# Patient Record
Sex: Male | Born: 1995
Health system: Southern US, Community
[De-identification: ages and names within clinical notes are randomized; demographics above are authoritative.]

## PROBLEM LIST (undated history)

## (undated) DIAGNOSIS — B20 Human immunodeficiency virus [HIV] disease: Secondary | ICD-10-CM

## (undated) DIAGNOSIS — Z21 Asymptomatic human immunodeficiency virus [HIV] infection status: Secondary | ICD-10-CM

## (undated) DIAGNOSIS — J45909 Unspecified asthma, uncomplicated: Secondary | ICD-10-CM

## (undated) DIAGNOSIS — R51 Headache: Secondary | ICD-10-CM

## (undated) DIAGNOSIS — I951 Orthostatic hypotension: Secondary | ICD-10-CM

## (undated) HISTORY — DX: Asymptomatic human immunodeficiency virus (hiv) infection status: Z21

## (undated) HISTORY — PX: HERNIA REPAIR: SHX51

## (undated) HISTORY — DX: Human immunodeficiency virus (HIV) disease: B20

## (undated) HISTORY — DX: Headache: R51

## (undated) HISTORY — PX: NO PAST SURGERIES: SHX2092

---
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
## 2016-02-04 ENCOUNTER — Encounter (HOSPITAL_COMMUNITY): Payer: Self-pay | Admitting: Emergency Medicine

## 2016-02-04 ENCOUNTER — Emergency Department (HOSPITAL_COMMUNITY)
Admission: EM | Admit: 2016-02-04 | Discharge: 2016-02-04 | Disposition: A | Discharge status: Home / Self Care | Payer: Medicaid Other | Attending: Emergency Medicine | Admitting: Emergency Medicine

## 2016-02-04 DIAGNOSIS — Z791 Long term (current) use of non-steroidal anti-inflammatories (NSAID): Secondary | ICD-10-CM | POA: Insufficient documentation

## 2016-02-04 DIAGNOSIS — J45909 Unspecified asthma, uncomplicated: Secondary | ICD-10-CM | POA: Insufficient documentation

## 2016-02-04 DIAGNOSIS — J019 Acute sinusitis, unspecified: Secondary | ICD-10-CM | POA: Insufficient documentation

## 2016-02-04 DIAGNOSIS — Z79899 Other long term (current) drug therapy: Secondary | ICD-10-CM | POA: Insufficient documentation

## 2016-02-04 MED ORDER — AMOXICILLIN-POT CLAVULANATE 875-125 MG PO TABS: 1.0000 | ORAL_TABLET | Freq: Two times a day (BID) | ORAL | Status: DC

## 2016-02-04 NOTE — ED Notes (Signed)
Pt c/o sinus pain x2 weeks 

## 2016-02-04 NOTE — Discharge Instructions (Signed)

## 2016-02-04 NOTE — ED Provider Notes (Signed)
CSN: [id number]     Arrival date & time 02/04/16  1448 History  By signing my name below, I, Iona Beard, attest that this documentation has been prepared under the direction and in the presence of Nakoa Ganus, PA-C.   Electronically Signed: Iona Beard, ED Scribe 02/04/2016 at 3:31 PM.  Chief Complaint  Patient presents with  . Facial Pain    The history is provided by the patient. No language interpreter was used.   HPI Comments: Gerald Lee is a 20 y.o. male with PMHx of asthma and headache who presents to the Emergency Department complaining of gradual onset, sinus pressure and facial pain, ongoing for two weeks. The sinus pressure is currently above his right eye but he reports feeling the sensation under and above both eyes intermittently over the past two weeks. Pt reports associated productive cough with green phlegm, rhinorrhea with green discharge, postnasal drip, and nasal congestion. Pt states he can feel the nasal drainage into the back of his throat which he then coughs up. No other associated symptoms reported. Pt has tried sudafed and ibuprofen with minimal relief to symptoms. No other worsening or alleviating factors noted. Denies known sick contacts. Pt denies sore throat, headache, syncope, ear pain, neck pain, chest pain, SOB, abdominal pain, nausea, vomiting, fevers, chills, or any other pertinent symptoms.   Past Medical History  Diagnosis Date  . Asthma   . XBJYNWGN(562.1)    Past Surgical History  Procedure Laterality Date  . Hernia repair      Done between the ages of 7 or 8 years   . Circumcision  1998   Family History  Problem Relation Age of Onset  . Stroke Mother   . Hypertension Mother    Social History  Substance Use Topics  . Smoking status: Never Smoker   . Smokeless tobacco: Never Used  . Alcohol Use: Yes     Comment: occ    Review of Systems  Constitutional: Negative for fever and chills.  HENT: Positive for congestion,  postnasal drip, rhinorrhea and sinus pressure. Negative for sore throat.   Respiratory: Positive for cough.   All other systems reviewed and are negative.    Allergies  Review of patient's allergies indicates no known allergies.  Home Medications   Prior to Admission medications   Medication Sig Start Date End Date Taking? Authorizing Provider  ibuprofen (ADVIL,MOTRIN) 800 MG tablet Take 800 mg by mouth every 8 (eight) hours as needed.   Yes Historical Provider, MD  amoxicillin-clavulanate (AUGMENTIN) 875-125 MG tablet Take 1 tablet by mouth every 12 (twelve) hours. 02/04/16   Gershon Shorten, PA-C  ondansetron (ZOFRAN ODT) 8 MG disintegrating tablet Take 1 tablet (8 mg total) by mouth every 8 (eight) hours as needed for nausea or vomiting. Patient not taking: Reported on 02/04/2016 04/30/15   [name], MD   BP 133/62 mmHg  Pulse 74  Temp(Src) 98.2 F (36.8 C) (Oral)  Resp 16  Ht  (1.93 m)  Wt 257 lb 8 oz (116.801 kg)  BMI 31.36 kg/m2  SpO2 97% Physical Exam  Constitutional: He appears well-developed and well-nourished. No distress.  HENT:  Head: Normocephalic and atraumatic.  Right Ear: Tympanic membrane and ear canal normal.  Left Ear: Tympanic membrane and ear canal normal.  Nose: Mucosal edema present. Right sinus exhibits frontal sinus tenderness.  Mouth/Throat: Uvula is midline.  Right frontal sinus TTP. No overlying erythema. Nasal congestion noted with postnasal drip. No oropharyngeal erythema or exudate.  Eyes: Conjunctivae are normal. Right eye exhibits no discharge. Left eye exhibits no discharge. No scleral icterus.  Neck: Normal range of motion. Neck supple.  Cardiovascular: Normal rate, regular rhythm and normal heart sounds.   Pulmonary/Chest: Effort normal and breath sounds normal. No respiratory distress.  Abdominal: He exhibits no distension. There is no tenderness.  Musculoskeletal: Normal range of motion.  Lymphadenopathy:    He has no cervical  adenopathy.  Neurological: He is alert. Coordination normal.  Skin: Skin is warm and dry.  Psychiatric: He has a normal mood and affect. His behavior is normal.  Nursing note and vitals reviewed.   ED Course  Procedures (including critical care time) DIAGNOSTIC STUDIES: Oxygen Saturation is 97% on RA, normal by my interpretation.    COORDINATION OF CARE: 3:12 PM Discussed treatment plan with pt at bedside and pt agreed to plan.  Labs Review Labs Reviewed - No data to display  Imaging Review No results found.   EKG Interpretation None      MDM   Final diagnoses:  Acute sinusitis, recurrence not specified, unspecified location   Patient presenting with symptoms of sinusitis x 2 weeks. Symptoms include sinus pressure, facial pain, congestion, postnasal drip and cough. Afebrile and nontoxic appearing. TTP of right frontal sinus. Nasal congestion noted with postnasal drip. Lungs CTAB. Concern for acute bacterial rhinosinusitis. Patient discharged with Augmentin. Instructions given for warm saline nasal wash and recommendations for follow-up with primary care physician as needed. Return precautions given in discharge paperwork and discussed with pt at bedside. Pt stable for discharge  I personally performed the services described in this documentation, which was scribed in my presence. The recorded information has been reviewed and is accurate.   Rolm GalaStevi Alexius Hangartner, PA-C 02/04/16 1532  Donnetta HutchingBrian Cook, MD 02/05/16 1316

## 2016-02-22 ENCOUNTER — Encounter (HOSPITAL_COMMUNITY): Payer: Self-pay

## 2016-02-22 ENCOUNTER — Emergency Department (HOSPITAL_COMMUNITY)
Admission: EM | Admit: 2016-02-22 | Discharge: 2016-02-22 | Disposition: A | Discharge status: Home / Self Care | Payer: Medicaid Other | Attending: Emergency Medicine | Admitting: Emergency Medicine

## 2016-02-22 DIAGNOSIS — Y9389 Activity, other specified: Secondary | ICD-10-CM | POA: Insufficient documentation

## 2016-02-22 DIAGNOSIS — Y990 Civilian activity done for income or pay: Secondary | ICD-10-CM | POA: Insufficient documentation

## 2016-02-22 DIAGNOSIS — S29012A Strain of muscle and tendon of back wall of thorax, initial encounter: Secondary | ICD-10-CM

## 2016-02-22 DIAGNOSIS — J45909 Unspecified asthma, uncomplicated: Secondary | ICD-10-CM | POA: Insufficient documentation

## 2016-02-22 DIAGNOSIS — Z79899 Other long term (current) drug therapy: Secondary | ICD-10-CM | POA: Insufficient documentation

## 2016-02-22 DIAGNOSIS — X500XXA Overexertion from strenuous movement or load, initial encounter: Secondary | ICD-10-CM | POA: Insufficient documentation

## 2016-02-22 DIAGNOSIS — Y92513 Shop (commercial) as the place of occurrence of the external cause: Secondary | ICD-10-CM | POA: Insufficient documentation

## 2016-02-22 MED ORDER — NAPROXEN 500 MG PO TABS: 500.0000 mg | ORAL_TABLET | Freq: Two times a day (BID) | ORAL | Status: DC

## 2016-02-22 MED ORDER — METHOCARBAMOL 500 MG PO TABS: 500.0000 mg | ORAL_TABLET | Freq: Every evening | ORAL | Status: DC | PRN

## 2016-02-22 NOTE — ED Notes (Signed)
Declined W/C at D/C and was escorted to lobby by RN. 

## 2016-02-22 NOTE — Discharge Instructions (Signed)
1. Medications: Take naproxen twice daily as needed for pain. Robaxin is your muscle relaxer - take this at night as needed for pain / muscle spasms. 2. Treatment: rest, drink plenty of fluids, ice affected area as needed 3. Follow Up: Please follow up with your primary doctor in 7 days if no improvement in symptoms for discussion of your diagnoses and further evaluation after today's visit. Please return to the ER for new or worsening symptoms, any additional concerns.   COLD THERAPY DIRECTIONS:  Ice or gel packs can be used to reduce both pain and swelling. Ice is the most helpful within the first 24 to 48 hours after an injury or flareup from overusing a muscle or joint.  Ice is effective, has very few side effects, and is safe for most people to use.   If you expose your skin to cold temperatures for too long or without the proper protection, you can damage your skin or nerves. Watch for signs of skin damage due to cold.   HOME CARE INSTRUCTIONS  Follow these tips to use ice and cold packs safely.  Place a dry or damp towel between the ice and skin. A damp towel will cool the skin more quickly, so you may need to shorten the time that the ice is used.  For a more rapid response, add gentle compression to the ice.  Ice for no more than 10 to 20 minutes at a time. The bonier the area you are icing, the less time it will take to get the benefits of ice.  Check your skin after 5 minutes to make sure there are no signs of a poor response to cold or skin damage.  Rest 20 minutes or more in between uses.  Once your skin is numb, you can end your treatment. You can test numbness by very lightly touching your skin. The touch should be so light that you do not see the skin dimple from the pressure of your fingertip. When using ice, most people will feel these normal sensations in this order: cold, burning, aching, and numbness.

## 2016-02-22 NOTE — ED Notes (Signed)
Pt reports having upper left scupla/back pain for two weeks that has worsened with working. Pt reporst working at [employer]. No specific known injury or trauma remembered.

## 2016-02-22 NOTE — ED Provider Notes (Signed)
CSN: [id number]     Arrival date & time 02/22/16  1621 History  By signing my name below, I, Rosario AdieWilliam Andrew Hiatt, attest that this documentation has been prepared under the direction and in the presence of [facility] HospitalJaime Flois Mctague, PA-C.   Electronically Signed: Rosario AdieWilliam Andrew Hiatt, ED Scribe. 02/22/2016. 6:03 PM.    Chief Complaint  Patient presents with  . Back Pain   The history is provided by the patient. No language interpreter was used.   HPI Comments: Gerald Lee is a 20 y.o. male who is ambidextrous who presents to the Emergency Department complaining of gradual onset, gradually worsening, constant left-sided upper back pain onset 3 weeks ago. His pain is worsened when he is laying down sleeping at night and while he is doing heavy lifting at work. No OTC medications or home remedies tried PTA. Pt reports that he works at [employer] and lifts heavy objects frequently. He originally attributed his pain to soreness, but notes it is still worsening. He denies any known trauma or injury to the area. Pt denies neck pain, headache, fever, or any other associated symptoms.   Past Medical History  Diagnosis Date  . Asthma   . [name](478.2Headache(784.0)    Past Surgical History  Procedure Laterality Date  . Hernia repair      Done between the ages of 7 or 8 years   . Circumcision  1998   Family History  Problem Relation Age of Onset  . Stroke Mother   . Hypertension Mother    Social History  Substance Use Topics  . Smoking status: Never Smoker   . Smokeless tobacco: Never Used  . Alcohol Use: Yes     Comment: occ    Review of Systems  Constitutional: Negative for fever.  Musculoskeletal: Positive for myalgias and back pain. Negative for neck pain.   Allergies  Review of patient's allergies indicates no known allergies.  Home Medications   Prior to Admission medications   Medication Sig Start Date End Date Taking? Authorizing Provider  amoxicillin-clavulanate (AUGMENTIN) 875-125 MG tablet Take 1  tablet by mouth every 12 (twelve) hours. 02/04/16   [name], PA-C  ibuprofen (ADVIL,MOTRIN) 800 MG tablet Take 800 mg by mouth every 8 (eight) hours as needed.    Historical Provider, MD  methocarbamol (ROBAXIN) 500 MG tablet Take 1 tablet (500 mg total) by mouth at bedtime as needed for muscle spasms. 02/22/16   Chase PicketJaime Pilcher Makoto Sellitto, PA-C  naproxen (NAPROSYN) 500 MG tablet Take 1 tablet (500 mg total) by mouth 2 (two) times daily. 02/22/16   Chase PicketJaime Pilcher Hildagarde Holleran, PA-C  ondansetron (ZOFRAN ODT) 8 MG disintegrating tablet Take 1 tablet (8 mg total) by mouth every 8 (eight) hours as needed for nausea or vomiting. Patient not taking: Reported on 02/04/2016 04/30/15   Azalia BilisKevin Campos, MD   BP 126/72 mmHg  Pulse 83  Temp(Src) 98.6 F (37 C) (Oral)  Resp 20  SpO2 100%   Physical Exam  Constitutional: He is oriented to person, place, and time. He appears well-developed and well-nourished.  HENT:  Head: Normocephalic and atraumatic.  Cardiovascular: Normal rate, regular rhythm and normal heart sounds.   No murmur heard. Pulmonary/Chest: Effort normal and breath sounds normal. No respiratory distress.  Musculoskeletal: Normal range of motion. He exhibits tenderness.       Arms: TTP as depicted in image. No erythema, crepitance, deformity, or step-off. 5/5 muscle strength of bilateral upper extremeiteis. No midline tenderness.  Neck: No midline or paraspinal tenderness.  Full ROM without pain.  Neurological: He is alert and oriented to person, place, and time.  Bilateral upper extremities neurovascularly intact.   Skin: Skin is warm and dry.  Psychiatric: He has a normal mood and affect. His behavior is normal.  Nursing note and vitals reviewed.  ED Course  Procedures (including critical care time)  DIAGNOSTIC STUDIES: Oxygen Saturation is 100% on RA, normal by my interpretation.   COORDINATION OF CARE: 6:01 PM-Discussed next steps with pt including anti-inflammatories and muscle relaxants. Pt  verbalized understanding and is agreeable with the plan.   Labs Review Labs Reviewed - No data to display  Imaging Review No results found.  I have personally reviewed and evaluated these images and lab results as part of my medical decision-making.   EKG Interpretation None      MDM   Final diagnoses:  Muscle strain of left upper back, initial encounter   Gerald Lee presents to ED for left upper back pain x 3 weeks which began after doing a lot of heavy lifting at work. He has continued to work ([employer]) throughout the last 3 weeks. He missed work today because his back was hurting him, and states he came in today because his boss told him he needed a note for missing work. On exam, patient is very well appearing. TTP of musculature, likely muscle strain. Symptomatic home care instructions were discussed with patient. Will treat with NSAID's and Robaxin QHS. Follow up with PCP in 1 week if symptoms do not improve. Return precautions given and all questions answered.   I personally performed the services described in this documentation, which was scribed in my presence. The recorded information has been reviewed and is accurate.   Chase PicketJaime Pilcher Hatice Bubel, PA-C 02/22/16 1844  Melene Planan Floyd, DO 02/22/16 2053

## 2016-12-18 ENCOUNTER — Emergency Department (HOSPITAL_COMMUNITY)
Admission: EM | Admit: 2016-12-18 | Discharge: 2016-12-19 | Disposition: A | Discharge status: Home / Self Care | Payer: Medicaid Other | Attending: Emergency Medicine | Admitting: Emergency Medicine

## 2016-12-18 DIAGNOSIS — Z79899 Other long term (current) drug therapy: Secondary | ICD-10-CM | POA: Insufficient documentation

## 2016-12-18 DIAGNOSIS — J45909 Unspecified asthma, uncomplicated: Secondary | ICD-10-CM | POA: Insufficient documentation

## 2016-12-18 DIAGNOSIS — L0292 Furuncle, unspecified: Secondary | ICD-10-CM | POA: Diagnosis present

## 2016-12-19 ENCOUNTER — Encounter (HOSPITAL_COMMUNITY): Payer: Self-pay

## 2016-12-19 NOTE — ED Notes (Signed)
Pt stable, ambulatory, states understanding of discharge instructions 

## 2016-12-19 NOTE — ED Provider Notes (Addendum)
[facility]-EMERGENCY DEPT Provider Note   CSN: [id number] Arrival date & time: 12/18/16  2330  By signing my name below, I, Gerald Lee, attest that this documentation has been prepared under the direction and in the presence of [name], MD . Electronically Signed: Majel Lee, Scribe. 12/19/2016. 1:00 AM.  History   Chief Complaint Chief Complaint  Patient presents with  . Genital Warts   The history is provided by the patient. No language interpreter was used.   HPI Comments: Gerald Lee is a 21 y.o. male with PMHx of asthma, who presents to the Emergency Department for an evaluation of a "bump" on his proximal penis that he noticed 2 days ago while in the shower. Pt reports hx of similar symptoms ~1 year ago in which he was tested for Herpes that returned negative. He states he visited the ED today "just to be safe." He denies any redness, drainage from the area, dysuria, or hematuria.   Past Medical History:  Diagnosis Date  . Asthma   . [name](478.2)    Patient Active Problem List   Diagnosis Date Noted  . Migraine without aura, without mention of intractable migraine without mention of status migrainosus 03/14/2014  . Episodic tension type headache 03/14/2014  . Obesity, unspecified 03/14/2014  . Acanthosis nigricans, acquired 03/14/2014  . Insomnia, unspecified 03/14/2014   Past Surgical History:  Procedure Laterality Date  . CIRCUMCISION  1998  . HERNIA REPAIR     Done between the ages of 39 or 8 years     Home Medications    Prior to Admission medications   Medication Sig Start Date End Date Taking? Authorizing Provider  amoxicillin-clavulanate (AUGMENTIN) 875-125 MG tablet Take 1 tablet by mouth every 12 (twelve) hours. 02/04/16   [name], PA-C  ibuprofen (ADVIL,MOTRIN) 800 MG tablet Take 800 mg by mouth every 8 (eight) hours as needed.    Historical Provider, MD  methocarbamol (ROBAXIN) 500 MG tablet Take 1 tablet (500 mg total) by mouth at bedtime as needed  for muscle spasms. 02/22/16   [name], PA-C  naproxen (NAPROSYN) 500 MG tablet Take 1 tablet (500 mg total) by mouth 2 (two) times daily. 02/22/16   [name], PA-C  ondansetron (ZOFRAN ODT) 8 MG disintegrating tablet Take 1 tablet (8 mg total) by mouth every 8 (eight) hours as needed for nausea or vomiting. Patient not taking: Reported on 02/04/2016 04/30/15   [name], MD   Family History Family History  Problem Relation Age of Onset  . Stroke Mother   . Hypertension Mother    Social History Social History  Substance Use Topics  . Smoking status: Never Smoker  . Smokeless tobacco: Never Used  . Alcohol use Yes     Comment: occ   Allergies   Patient has no known allergies.  Review of Systems Review of Systems  Genitourinary: Negative for dysuria and hematuria.       +small bump to proximal penis  All other systems reviewed and are negative.  Physical Exam Updated Vital Signs BP 119/64 (BP Location: Left Arm)   Pulse 74   Temp 98.3 F (36.8 C) (Oral)   Resp 16   SpO2 100%   Physical Exam  Constitutional: He is oriented to person, place, and time. Vital signs are normal. He appears well-developed and well-nourished.  Non-toxic appearance. He does not appear ill. No distress.  HENT:  Head: Normocephalic and atraumatic.  Nose: Nose normal.  Mouth/Throat: Oropharynx is clear  and moist. No oropharyngeal exudate.  Eyes: Conjunctivae and EOM are normal. Pupils are equal, round, and reactive to light. No scleral icterus.  Neck: Normal range of motion. Neck supple. No tracheal deviation, no edema, no erythema and normal range of motion present. No thyroid mass and no thyromegaly present.  Cardiovascular: Normal rate, regular rhythm, S1 normal, S2 normal, normal heart sounds, intact distal pulses and normal pulses.  Exam reveals no gallop and no friction rub.   No murmur heard. Pulmonary/Chest: Effort normal and breath sounds normal. No respiratory distress. He  has no wheezes. He has no rhonchi. He has no rales.  Abdominal: Soft. Normal appearance and bowel sounds are normal. He exhibits no distension, no ascites and no mass. There is no hepatosplenomegaly. There is no tenderness. There is no rebound, no guarding and no CVA tenderness.  Genitourinary:  Genitourinary Comments: Tiny boil on the dorsum of the proximal penis, no erythema, no purulence, no drainage, no TTP.  Musculoskeletal: Normal range of motion. He exhibits no edema or tenderness.  Lymphadenopathy:    He has no cervical adenopathy.  Neurological: He is alert and oriented to person, place, and time. He has normal strength. No cranial nerve deficit or sensory deficit.  Skin: Skin is warm, dry and intact. No petechiae and no rash noted. He is not diaphoretic. No erythema. No pallor.  Nursing note and vitals reviewed.  ED Treatments / Results  DIAGNOSTIC STUDIES:  Oxygen Saturation is 100% on RA, normal by my interpretation.    COORDINATION OF CARE:  12:57 AM Discussed treatment plan with pt at bedside and pt agreed to plan.  Labs (all labs ordered are listed, but only abnormal results are displayed) Labs Reviewed - No data to display  EKG  EKG Interpretation None       Radiology No results found.  Procedures Procedures (including critical care time)  Medications Ordered in ED Medications - No data to display  Initial Impression / Assessment and Plan / ED Course  I have reviewed the triage vital signs and the nursing notes.  Pertinent labs & imaging results that were available during my care of the patient were reviewed by me and considered in my medical decision making (see chart for details).     Patient presents to the ED for a bump on his penis. It appears to be a well healing boil from ingrown hair.  He was reassured that this is not herpes.  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  He was educated on current diagnosis.  He was  advised to fu with PCP within 3 days.  He demonstrates good understanding of the plan.  The boil is very small and there is no purulence to lance.  He appears well and in NAD. VS are normal. Patient is safe for DC.   I personally performed the services described in this documentation, which was scribed in my presence. The recorded information has been reviewed and is accurate.     Final Clinical Impressions(s) / ED Diagnoses   Final diagnoses:  None    New Prescriptions New Prescriptions   No medications on file         [name], MD 12/19/16 0105

## 2016-12-19 NOTE — ED Triage Notes (Signed)
Pt states he wants to be evaluated for bump on genial that he noticed on yesterday; pt states slight pain at 1/10 on arrival. Pt a&ox 4 on arrival.

## 2017-02-28 ENCOUNTER — Emergency Department (HOSPITAL_COMMUNITY)
Admission: EM | Admit: 2017-02-28 | Discharge: 2017-02-28 | Disposition: A | Discharge status: Home / Self Care | Payer: Medicaid Other | Attending: Emergency Medicine | Admitting: Emergency Medicine

## 2017-02-28 ENCOUNTER — Encounter (HOSPITAL_COMMUNITY): Payer: Self-pay | Admitting: Emergency Medicine

## 2017-02-28 DIAGNOSIS — R51 Headache: Secondary | ICD-10-CM | POA: Diagnosis present

## 2017-02-28 DIAGNOSIS — B349 Viral infection, unspecified: Secondary | ICD-10-CM | POA: Insufficient documentation

## 2017-02-28 DIAGNOSIS — J45909 Unspecified asthma, uncomplicated: Secondary | ICD-10-CM | POA: Diagnosis not present

## 2017-02-28 DIAGNOSIS — R519 Headache, unspecified: Secondary | ICD-10-CM

## 2017-02-28 LAB — RAPID STREP SCREEN (MED CTR MEBANE ONLY): Streptococcus, Group A Screen (Direct): NEGATIVE

## 2017-02-28 MED ORDER — PROCHLORPERAZINE EDISYLATE 5 MG/ML IJ SOLN
10.0000 mg | Freq: Once | INTRAMUSCULAR | Status: AC
  Administered 2017-02-28: 10 mg via INTRAVENOUS
  Filled 2017-02-28: qty 2

## 2017-02-28 MED ORDER — SODIUM CHLORIDE 0.9 % IV BOLUS (SEPSIS)
1000.0000 mL | Freq: Once | INTRAVENOUS | Status: AC
  Administered 2017-02-28: 1000 mL via INTRAVENOUS

## 2017-02-28 MED ORDER — DIPHENHYDRAMINE HCL 50 MG/ML IJ SOLN
25.0000 mg | Freq: Once | INTRAMUSCULAR | Status: AC
  Administered 2017-02-28: 25 mg via INTRAVENOUS
  Filled 2017-02-28: qty 1

## 2017-02-28 MED ORDER — KETOROLAC TROMETHAMINE 30 MG/ML IJ SOLN
30.0000 mg | Freq: Once | INTRAMUSCULAR | Status: AC
  Administered 2017-02-28: 30 mg via INTRAVENOUS
  Filled 2017-02-28: qty 1

## 2017-02-28 NOTE — Discharge Instructions (Signed)
Return to the ED with change in vision or speech, weakness, fainting, difficulty breathing, vomiting and not able to keep down liquids, decreased level of alertness/lethargy, or any other alarming symptoms

## 2017-02-28 NOTE — ED Provider Notes (Signed)
[facility]-EMERGENCY DEPT Provider Note   CSN: [id number] Arrival date & time: 02/28/17  [id number]     History   Chief Complaint Chief Complaint  Patient presents with  . Eye Pain  . Generalized Body Aches    HPI Gerald Lee is a 21 y.o. male.  HPI  Pt with hx of headaches presenting with c/o headache and body aches.  Headache is behind both eyes and has been ongoing for the past 2 days.  He also c/o diffuse generalized body aches.  He has tried ibuprofen and has been drinking water.  No specific sick contacts.  He denies sore throat but states his throat has felt dry.  He also states he has been working at a new job at a computer and knows that he needs glasses but hasn't been able to afford eye exam/glasses.  No diplopia.  There are no other associated systemic symptoms, there are no other alleviating or modifying factors.   Past Medical History:  Diagnosis Date  . Asthma   . FAOZHYQM(578.4Headache(784.0)     Patient Active Problem List   Diagnosis Date Noted  . Migraine without aura, without mention of intractable migraine without mention of status migrainosus 03/14/2014  . Episodic tension type headache 03/14/2014  . Obesity, unspecified 03/14/2014  . Acanthosis nigricans, acquired 03/14/2014  . Insomnia, unspecified 03/14/2014    Past Surgical History:  Procedure Laterality Date  . CIRCUMCISION  1998  . HERNIA REPAIR     Done between the ages of 89 or 8 years        Home Medications    Prior to Admission medications   Medication Sig Start Date End Date Taking? Authorizing Provider  amoxicillin-clavulanate (AUGMENTIN) 875-125 MG tablet Take 1 tablet by mouth every 12 (twelve) hours. Patient not taking: Reported on 02/28/2017 02/04/16   Barrett, Rolm GalaStevi, PA-C  ibuprofen (ADVIL,MOTRIN) 800 MG tablet Take 800 mg by mouth every 8 (eight) hours as needed.    [provider]  methocarbamol (ROBAXIN) 500 MG tablet Take 1 tablet (500 mg total) by mouth at bedtime as needed for muscle  spasms. Patient not taking: Reported on 02/28/2017 02/22/16   Ward, Chase PicketJaime Pilcher, PA-C  naproxen (NAPROSYN) 500 MG tablet Take 1 tablet (500 mg total) by mouth 2 (two) times daily. Patient not taking: Reported on 02/28/2017 02/22/16   Ward, Chase PicketJaime Pilcher, PA-C  ondansetron (ZOFRAN ODT) 8 MG disintegrating tablet Take 1 tablet (8 mg total) by mouth every 8 (eight) hours as needed for nausea or vomiting. Patient not taking: Reported on 02/04/2016 04/30/15   Azalia Bilisampos, Kevin, MD    Family History Family History  Problem Relation Age of Onset  . Stroke Mother   . Hypertension Mother     Social History Social History  Substance Use Topics  . Smoking status: Never Smoker  . Smokeless tobacco: Never Used  . Alcohol use Yes     Comment: occ     Allergies   Patient has no known allergies.   Review of Systems Review of Systems  ROS reviewed and all otherwise negative except for mentioned in HPI   Physical Exam Updated Vital Signs BP (!) 115/52   Pulse 71   Temp 100 F (37.8 C) (Oral)   Resp 16   Ht 6\' 4"  (1.93 m)   Wt 113.4 kg (250 lb)   SpO2 97%   BMI 30.43 kg/m  Vitals reviewed Physical Exam Physical Examination: General appearance - alert, well appearing, and in no distress Mental  status - alert, oriented to person, place, and time Eyes - pupils equal and reactive, extraocular eye movements intact Mouth - mucous membranes moist, pharynx normal without lesions, mild erythema of OP, no exudate, palate symmetric, uvula midline Neck - supple, no significant adenopathy Chest - clear to auscultation, no wheezes, rales or rhonchi, symmetric air entry Heart - normal rate, regular rhythm, normal S1, S2, no murmurs, rubs, clicks or gallops Abdomen - soft, nontender, nondistended, no masses or organomegaly Neurological - alert, oriented x 3, no cranial nerve defect, strength and sensation intact in extremities x 4 Extremities - peripheral pulses normal, no pedal edema, no clubbing or  cyanosis Skin - normal coloration and turgor, no rashes  ED Treatments / Results  Labs (all labs ordered are listed, but only abnormal results are displayed) Labs Reviewed  RAPID STREP SCREEN (NOT AT [facility])  CULTURE, GROUP A STREP [facility])    EKG  EKG Interpretation None       Radiology No results found.  Procedures Procedures (including critical care time)  Medications Ordered in ED Medications  ketorolac (TORADOL) 30 MG/ML injection 30 mg (30 mg Intravenous Given 02/28/17 0827)  diphenhydrAMINE (BENADRYL) injection 25 mg (25 mg Intravenous Given 02/28/17 0827)  prochlorperazine (COMPAZINE) injection 10 mg (10 mg Intravenous Given 02/28/17 0827)  sodium chloride 0.9 % bolus 1,000 mL (0 mLs Intravenous Stopped 02/28/17 1008)     Initial Impression / Assessment and Plan / ED Course  I have reviewed the triage vital signs and the nursing notes.  Pertinent labs & imaging results that were available during my care of the patient were reviewed by me and considered in my medical decision making (see chart for details).     Pt presenting with c/o headache and generalized body aches.  Pt treated with migraine cocktail, rapid strep negative.  No nuchal rigidity to suggest meningitis.   Patient is overall nontoxic and well hydrated in appearance.  suspcect viral illness.  Pt discharged with strict return precautions.  Mom agreeable with plan   Final Clinical Impressions(s) / ED Diagnoses   Final diagnoses:  Bad headache  Viral syndrome    New Prescriptions Discharge Medication List as of 02/28/2017  9:56 AM       Jerelyn Scott, MD 03/04/17 [phone]

## 2017-02-28 NOTE — ED Triage Notes (Signed)
Pt reports generalized body aches, nausea, dizziness, bilateral behind the eye pain with blurry vision, and a headache that started two days ago. Pt denies any injuries or out of the ordinary routine.

## 2017-03-02 LAB — CULTURE, GROUP A STREP (THRC)

## 2017-05-02 DIAGNOSIS — R21 Rash and other nonspecific skin eruption: Secondary | ICD-10-CM | POA: Diagnosis present

## 2017-05-02 DIAGNOSIS — B084 Enteroviral vesicular stomatitis with exanthem: Secondary | ICD-10-CM | POA: Diagnosis not present

## 2017-05-02 DIAGNOSIS — J45909 Unspecified asthma, uncomplicated: Secondary | ICD-10-CM | POA: Diagnosis not present

## 2017-05-03 ENCOUNTER — Emergency Department (HOSPITAL_COMMUNITY)
Admission: EM | Admit: 2017-05-03 | Discharge: 2017-05-03 | Disposition: A | Discharge status: Home / Self Care | Payer: Medicaid Other | Attending: Emergency Medicine | Admitting: Emergency Medicine

## 2017-05-03 ENCOUNTER — Encounter (HOSPITAL_COMMUNITY): Payer: Self-pay | Admitting: Emergency Medicine

## 2017-05-03 DIAGNOSIS — B084 Enteroviral vesicular stomatitis with exanthem: Secondary | ICD-10-CM

## 2017-05-03 NOTE — ED Triage Notes (Signed)
Patient states he was getting a rash a week ago. Patient states that it has spread to his torso. Patient states it is not itching.

## 2017-05-03 NOTE — Discharge Instructions (Signed)
Follow-up with your primary care doctor in the next 24-48 hours for further evaluation.   Return to the Emergency Department for any worsening rash, spreading of the rash, fevers, chest pain, difficulty breathing, or any other worsening or concerning symptoms.

## 2017-05-03 NOTE — ED Provider Notes (Signed)
[facility]-EMERGENCY DEPT Provider Note   CSN: [id number] Arrival date & time: 05/02/17  2237     History   Chief Complaint Chief Complaint  Patient presents with  . Rash    HPI Gerald Lee is a 21 y.o. male who presents with 1 week of generalized rash. Patient states that the rash is not painful or pruritic. He does report that 2 days prior to onset of rash, he used a new body soap. Patient denies any other new lotions, detergents, soaps. Patient also reports a coworker at work was recently diagnosed with hand-foot-and-mouth disease approximately one week ago. He states that he did have close contact to the coworker prior to diagnoses. Patient denies any fevers, chills, chest pain, difficulty breathing, lip or throat swelling, dysuria, hematuria, penile swelling, penile discharge, joint swelling. Denies any tick bites. He states he has not had any exposure to the woods. She denies any new medications.  The history is provided by the patient.    Past Medical History:  Diagnosis Date  . Asthma   . [name](478.2Headache(784.0)     Patient Active Problem List   Diagnosis Date Noted  . Migraine without aura, without mention of intractable migraine without mention of status migrainosus 03/14/2014  . Episodic tension type headache 03/14/2014  . Obesity, unspecified 03/14/2014  . Acanthosis nigricans, acquired 03/14/2014  . Insomnia, unspecified 03/14/2014    Past Surgical History:  Procedure Laterality Date  . CIRCUMCISION  1998  . HERNIA REPAIR     Done between the ages of 89 or 8 years        Home Medications    Prior to Admission medications   Medication Sig Start Date End Date Taking? Authorizing Provider  amoxicillin-clavulanate (AUGMENTIN) 875-125 MG tablet Take 1 tablet by mouth every 12 (twelve) hours. Patient not taking: Reported on 02/28/2017 02/04/16   Barrett, Rolm GalaStevi, PA-C  ibuprofen (ADVIL,MOTRIN) 800 MG tablet Take 800 mg by mouth every 8 (eight) hours as needed.    [provider]  methocarbamol (ROBAXIN) 500 MG tablet Take 1 tablet (500 mg total) by mouth at bedtime as needed for muscle spasms. Patient not taking: Reported on 02/28/2017 02/22/16   Ward, Chase PicketJaime Pilcher, PA-C  naproxen (NAPROSYN) 500 MG tablet Take 1 tablet (500 mg total) by mouth 2 (two) times daily. Patient not taking: Reported on 02/28/2017 02/22/16   Ward, Chase PicketJaime Pilcher, PA-C  ondansetron (ZOFRAN ODT) 8 MG disintegrating tablet Take 1 tablet (8 mg total) by mouth every 8 (eight) hours as needed for nausea or vomiting. Patient not taking: Reported on 02/04/2016 04/30/15   Azalia Bilisampos, Kevin, MD    Family History Family History  Problem Relation Age of Onset  . Stroke Mother   . Hypertension Mother     Social History Social History  Substance Use Topics  . Smoking status: Never Smoker  . Smokeless tobacco: Never Used  . Alcohol use Yes     Comment: occ     Allergies   Patient has no known allergies.   Review of Systems Review of Systems  Constitutional: Negative for chills and fever.  Respiratory: Negative for shortness of breath.   Cardiovascular: Negative for chest pain.  Gastrointestinal: Negative for abdominal pain, nausea and vomiting.  Genitourinary: Negative for discharge, dysuria, hematuria, penile swelling and scrotal swelling.  Musculoskeletal: Negative for arthralgias and joint swelling.  Skin: Positive for rash.  Neurological: Negative for dizziness, weakness and numbness.  Psychiatric/Behavioral: Negative for confusion.     Physical Exam  Updated Vital Signs BP 139/87 (BP Location: Left Arm)   Pulse 91   Temp 98.4 F (36.9 C) (Oral)   Resp 18   Ht 6\' 4"  (1.93 m)   Wt 113.4 kg (250 lb)   SpO2 100%   BMI 30.43 kg/m   Physical Exam  Constitutional: He appears well-developed and well-nourished.  Sitting comfortably on examination table  HENT:  Head: Normocephalic and atraumatic.  Mouth/Throat: Uvula is midline, oropharynx is clear and moist and mucous  membranes are normal. No oral lesions.  No oral angioedema. No oral lesions.  Eyes: Conjunctivae and EOM are normal. Right eye exhibits no discharge. Left eye exhibits no discharge. No scleral icterus.  Cardiovascular:  Pulses:      Radial pulses are 2+ on the right side, and 2+ on the left side.       Dorsalis pedis pulses are 2+ on the right side, and 2+ on the left side.  Pulmonary/Chest: Effort normal.  No evidence of respiratory distress. Able to speak in full sentences without difficulty.  Neurological: He is alert.  Skin: Skin is warm and dry. Rash noted.  Diffuse erythematous, macular papular rash to the anterior chest, back, BUE. Patient has one small area on his left sole and 2 small areas on the bilateral palms.   Psychiatric: He has a normal mood and affect. His speech is normal and behavior is normal.  Nursing note and vitals reviewed.          ED Treatments / Results  Labs (all labs ordered are listed, but only abnormal results are displayed) Labs Reviewed - No data to display  EKG  EKG Interpretation None       Radiology No results found.  Procedures Procedures (including critical care time)  Medications Ordered in ED Medications - No data to display   Initial Impression / Assessment and Plan / ED Course  I have reviewed the triage vital signs and the nursing notes.  Pertinent labs & imaging results that were available during my care of the patient were reviewed by me and considered in my medical decision making (see chart for details).     21 year old male who presents with 1 week of rash. No fever, chills, chest pain, difficulty breathing, difficulty swelling, or laryngeal edema. Does have reported exposure to new soap but stop using the soap and has not had improvement in rash. No recent tick bites or wind exposure. No joint swelling or fevers. Recent exposure to hand-foot-and-mouth disease. Consider contact dermatitis versus hand-foot-and-mouth  disease. History/physical exam or not concerning for [facility] spotted fever, syphilis, SJS, TENS.  Discussed patient with Dr. Bebe Shaggy. Physical exam is consistent with hand-foot-and-mouth disease. Plan to treat as hand-foot-and-mouth disease. Discussed with patient. Home conservative therapies discussed. Provided patient with a list of clinic resources to use if he does not have a PCP. Instructed to call them today to arrange follow-up in the next 24-48 hours. Strict return precautions discussed. Patient expresses understanding and agreement to plan.    Final Clinical Impressions(s) / ED Diagnoses   Final diagnoses:  Hand, foot and mouth disease    New Prescriptions Discharge Medication List as of 05/03/2017 12:55 AM       Maxwell Caul, PA-C 05/03/17 1610    Zadie Rhine, MD 05/04/17 [phone]

## 2017-05-03 NOTE — ED Notes (Signed)
Bed: [facility] Expected date:  Expected time:  Means of arrival:  Comments: 

## 2017-05-17 ENCOUNTER — Emergency Department (HOSPITAL_COMMUNITY): Payer: Medicaid Other

## 2017-05-17 ENCOUNTER — Emergency Department (HOSPITAL_COMMUNITY)
Admission: EM | Admit: 2017-05-17 | Discharge: 2017-05-17 | Disposition: A | Discharge status: Home / Self Care | Payer: Medicaid Other | Attending: Pediatrics | Admitting: Pediatrics

## 2017-05-17 ENCOUNTER — Encounter (HOSPITAL_COMMUNITY): Payer: Self-pay | Admitting: *Deleted

## 2017-05-17 DIAGNOSIS — J45909 Unspecified asthma, uncomplicated: Secondary | ICD-10-CM | POA: Insufficient documentation

## 2017-05-17 DIAGNOSIS — B20 Human immunodeficiency virus [HIV] disease: Secondary | ICD-10-CM | POA: Insufficient documentation

## 2017-05-17 DIAGNOSIS — M25562 Pain in left knee: Secondary | ICD-10-CM | POA: Insufficient documentation

## 2017-05-17 DIAGNOSIS — M25561 Pain in right knee: Secondary | ICD-10-CM | POA: Diagnosis not present

## 2017-05-17 DIAGNOSIS — R21 Rash and other nonspecific skin eruption: Secondary | ICD-10-CM | POA: Diagnosis present

## 2017-05-17 LAB — URINALYSIS, ROUTINE W REFLEX MICROSCOPIC
BILIRUBIN URINE: NEGATIVE
Glucose, UA: NEGATIVE mg/dL
HGB URINE DIPSTICK: NEGATIVE
Ketones, ur: NEGATIVE mg/dL
LEUKOCYTES UA: NEGATIVE
NITRITE: NEGATIVE
PROTEIN: 30 mg/dL — AB
Specific Gravity, Urine: 1.025 (ref 1.005–1.030)
Squamous Epithelial / LPF: NONE SEEN
pH: 6 (ref 5.0–8.0)

## 2017-05-17 LAB — CBC WITH DIFFERENTIAL/PLATELET
BASOS ABS: 0.1 10*3/uL (ref 0.0–0.1)
Basophils Relative: 1 %
EOS ABS: 0.1 10*3/uL (ref 0.0–0.7)
EOS PCT: 1 %
HCT: 43.3 % (ref 39.0–52.0)
Hemoglobin: 14.2 g/dL (ref 13.0–17.0)
Lymphocytes Relative: 50 %
Lymphs Abs: 3.6 10*3/uL (ref 0.7–4.0)
MCH: 27.2 pg (ref 26.0–34.0)
MCHC: 32.8 g/dL (ref 30.0–36.0)
MCV: 83 fL (ref 78.0–100.0)
Monocytes Absolute: 0.6 10*3/uL (ref 0.1–1.0)
Monocytes Relative: 9 %
Neutro Abs: 2.7 10*3/uL (ref 1.7–7.7)
Neutrophils Relative %: 39 %
PLATELETS: 259 10*3/uL (ref 150–400)
RBC: 5.22 MIL/uL (ref 4.22–5.81)
RDW: 14.1 % (ref 11.5–15.5)
WBC: 7.1 10*3/uL (ref 4.0–10.5)

## 2017-05-17 LAB — COMPREHENSIVE METABOLIC PANEL
ALT: 31 U/L (ref 17–63)
AST: 27 U/L (ref 15–41)
Albumin: 4.1 g/dL (ref 3.5–5.0)
Alkaline Phosphatase: 135 U/L — ABNORMAL HIGH (ref 38–126)
Anion gap: 7 (ref 5–15)
BUN: 8 mg/dL (ref 6–20)
CHLORIDE: 104 mmol/L (ref 101–111)
CO2: 26 mmol/L (ref 22–32)
CREATININE: 1 mg/dL (ref 0.61–1.24)
Calcium: 9.5 mg/dL (ref 8.9–10.3)
GFR calc non Af Amer: >60 mL/min (ref >60)
Glucose, Bld: 85 mg/dL (ref 65–99)
Potassium: 4 mmol/L (ref 3.5–5.1)
SODIUM: 137 mmol/L (ref 135–145)
Total Bilirubin: 0.6 mg/dL (ref 0.3–1.2)
Total Protein: 8.6 g/dL — ABNORMAL HIGH (ref 6.5–8.1)

## 2017-05-17 LAB — RAPID HIV SCREEN (HIV 1/2 AB+AG)
HIV 1/2 ANTIBODIES: REACTIVE — AB
HIV-1 P24 ANTIGEN - HIV24: NONREACTIVE

## 2017-05-17 LAB — SEDIMENTATION RATE: Sed Rate: 30 mm/hr — ABNORMAL HIGH (ref 0–16)

## 2017-05-17 LAB — C-REACTIVE PROTEIN: CRP: 1.5 mg/dL — AB (ref <1.0)

## 2017-05-17 NOTE — ED Triage Notes (Signed)
Pt in c/o rash bil hands, pt dx with hand, foot & mouth disease last week, pt here today for rash worsening on body, no blisters noted, all skin intact, denies pain and blisters in the mouth, pt c/o R knee pain without reported injury, A&O x4

## 2017-05-17 NOTE — ED Notes (Signed)
Called lab, stated they would add on HIV and RPR

## 2017-05-17 NOTE — ED Notes (Signed)
NP and MD at bedside to talk with pt

## 2017-05-17 NOTE — ED Provider Notes (Signed)
[facility]-EMERGENCY DEPT Provider Note   CSN: [id number] Arrival date & time: 05/17/17  1422   History   Chief Complaint Chief Complaint  Patient presents with  . Rash    HPI Gerald Lee is a 21 y.o. male with a PMH of asthma and generalized headaches who presents to the emergency department for rash and bilateral knee pain. He was seen at [facility] in the ED on 9/5 for a rash x1 week. Rash at that time was not pruritic. Sx were thought to be secondary to HFM disease and he was instructed to f/u if sx did not improve. He denied fever or oral lesions at the time he was seen but did have close contact to a co-worker who was dx with HFM.   Today, he states the rash has worsened and is now on his torso x3 days. Rash is intermittent pruritic "but not that bad". He did clean his house with spray cleaner and no shirt on prior to onset of rash. He also reports using a new soap. He has no known food/drug allergies. No family members with similar rash. No tick bites. No fever, chills, fatigue, URI sx, n/v/d, sore throat, or headache. No facial swelling, shortness of breath, wheezing, or abdominal pain.   Bilateral knee pain began 2 days ago. No erythema or swelling to the knees. No known injury but does "stand up a lot for work". He is also obese but reports no hx of knee pain. Denies limp, inability to ambulate, or numbness/tingling. No meds PTA. Eating/drinking at baseline. Normal UOP. No hematuria or dysuria. He is sexually active with his boyfriend and has been tested for STI's in the past. Denies IV drug use. Immunizations UTD.   The history is provided by the patient. No language interpreter was used.    Past Medical History:  Diagnosis Date  . Asthma   . JYNWGNFA(213.0)     Patient Active Problem List   Diagnosis Date Noted  . Migraine without aura, without mention of intractable migraine without mention of status migrainosus 03/14/2014  . Episodic tension type headache 03/14/2014  .  Obesity, unspecified 03/14/2014  . Acanthosis nigricans, acquired 03/14/2014  . Insomnia, unspecified 03/14/2014    Past Surgical History:  Procedure Laterality Date  . CIRCUMCISION  1998  . HERNIA REPAIR     Done between the ages of 89 or 8 years        Home Medications    Prior to Admission medications   Medication Sig Start Date End Date Taking? Authorizing Provider  amoxicillin-clavulanate (AUGMENTIN) 875-125 MG tablet Take 1 tablet by mouth every 12 (twelve) hours. Patient not taking: Reported on 02/28/2017 02/04/16   Barrett, Rolm Gala, PA-C  ibuprofen (ADVIL,MOTRIN) 800 MG tablet Take 800 mg by mouth every 8 (eight) hours as needed.    [provider]  methocarbamol (ROBAXIN) 500 MG tablet Take 1 tablet (500 mg total) by mouth at bedtime as needed for muscle spasms. Patient not taking: Reported on 02/28/2017 02/22/16   Ward, Chase Picket, PA-C  naproxen (NAPROSYN) 500 MG tablet Take 1 tablet (500 mg total) by mouth 2 (two) times daily. Patient not taking: Reported on 02/28/2017 02/22/16   Ward, Chase Picket, PA-C  ondansetron (ZOFRAN ODT) 8 MG disintegrating tablet Take 1 tablet (8 mg total) by mouth every 8 (eight) hours as needed for nausea or vomiting. Patient not taking: Reported on 02/04/2016 04/30/15   [name], MD    Family History Family History  Problem Relation  Age of Onset  . Stroke Mother   . Hypertension Mother     Social History Social History  Substance Use Topics  . Smoking status: Never Smoker  . Smokeless tobacco: Never Used  . Alcohol use Yes     Comment: occ     Allergies   Patient has no known allergies.   Review of Systems Review of Systems  Constitutional: Negative for activity change, appetite change, chills, fatigue, fever and unexpected weight change.  HENT: Negative for congestion, mouth sores, sore throat, trouble swallowing and voice change.   Eyes: Negative for redness and visual disturbance.  Respiratory: Negative for cough,  shortness of breath and wheezing.   Cardiovascular: Negative for chest pain, palpitations and leg swelling.  Gastrointestinal: Negative for abdominal pain, diarrhea, nausea and vomiting.  Genitourinary: Negative for decreased urine volume, dysuria and hematuria.  Musculoskeletal: Negative for back pain, gait problem, neck pain and neck stiffness.       Bilateral knee pain  Skin: Positive for rash. Negative for wound.  Neurological: Negative for dizziness, seizures, syncope, facial asymmetry and weakness.  All other systems reviewed and are negative.    Physical Exam Updated Vital Signs BP 116/76 (BP Location: Left Arm)   Pulse 95   Temp 98.5 F (36.9 C) (Oral)   Resp 16   Ht  (1.93 m)   Wt 113.4 kg (250 lb)   SpO2 98%   BMI 30.43 kg/m   Physical Exam  Constitutional: He is oriented to person, place, and time. He appears well-developed and well-nourished.  Non-toxic appearance. No distress.  Alert and cooperative, playing on cell phone in the bed.  HENT:  Head: Normocephalic and atraumatic.  Right Ear: Tympanic membrane and external ear normal.  Left Ear: Tympanic membrane and external ear normal.  Nose: Nose normal.  Mouth/Throat: Uvula is midline, oropharynx is clear and moist and mucous membranes are normal.  Eyes: Pupils are equal, round, and reactive to light. Conjunctivae, EOM and lids are normal. No scleral icterus.  Neck: Full passive range of motion without pain. Neck supple.  Cardiovascular: Normal rate, normal heart sounds and intact distal pulses.   No murmur heard. Pulmonary/Chest: Effort normal and breath sounds normal.  Abdominal: Soft. Normal appearance and bowel sounds are normal. There is no hepatosplenomegaly. There is no tenderness.  Musculoskeletal: Normal range of motion.       Right hip: Normal.       Left hip: Normal.       Right knee: Normal.       Left knee: Normal.       Right ankle: Normal.       Left ankle: Normal.  Moving all  extremities without difficulty. NVI throughout.   Lymphadenopathy:    He has no cervical adenopathy.  Neurological: He is alert and oriented to person, place, and time. He has normal strength. Coordination and gait normal.  Skin: Skin is warm and dry. Capillary refill takes less than 2 seconds. Rash noted. Rash is maculopapular.  Maculopapular rash present on torso and lower back. No vesicles or red streaking. No ttp or current drainage.   Psychiatric: He has a normal mood and affect.  Nursing note and vitals reviewed.  ED Treatments / Results  Labs (all labs ordered are listed, but only abnormal results are displayed) Labs Reviewed  URINALYSIS, ROUTINE W REFLEX MICROSCOPIC - Abnormal; Notable for the following:       Result Value   Protein, ur 30 (*)  Bacteria, UA RARE (*)    All other components within normal limits  COMPREHENSIVE METABOLIC PANEL - Abnormal; Notable for the following:    Total Protein 8.6 (*)    Alkaline Phosphatase 135 (*)    All other components within normal limits  SEDIMENTATION RATE - Abnormal; Notable for the following:    Sed Rate 30 (*)    All other components within normal limits  C-REACTIVE PROTEIN - Abnormal; Notable for the following:    CRP 1.5 (*)    All other components within normal limits  RAPID HIV SCREEN (HIV 1/2 AB+AG) - Abnormal; Notable for the following:    HIV 1/2 Antibodies Reactive (*)    All other components within normal limits  CBC WITH DIFFERENTIAL/PLATELET  ROCKY MTN SPOTTED FVR ABS PNL(IGG+IGM)  B. BURGDORFI ANTIBODIES  RPR  HIV 1/2 AB - DIFFERENTIATION  HIV-1 RNA ULTRAQUANT REFLEX TO GENTYP+  HIV-1 RNA, QUALITATIVE, TMA  CD4/CD8 (T-HELPER/T-SUPPRESSOR CELL)  T-HELPER CELLS (CD4) COUNT (NOT AT [facility])  GC/CHLAMYDIA PROBE AMP ([place]) NOT AT [facility]    EKG  EKG Interpretation None       Radiology Dg Knee 2 Views Left  Result Date: 05/17/2017 CLINICAL DATA:  Pain for 1 week EXAM: LEFT KNEE - 1-2 VIEW COMPARISON:   None. FINDINGS: Frontal and lateral views were obtained. No fracture or dislocation. No joint effusion. Joint spaces appear normal. No erosive change. IMPRESSION: No evident fracture or joint effusion.  No appreciable arthropathy. Electronically Signed   By: Bretta Bang III M.D.   On: 05/17/2017 17:39   Dg Knee 2 Views Right  Result Date: 05/17/2017 CLINICAL DATA:  Right knee pain for 1 week.  No known injury. EXAM: RIGHT KNEE - 1-2 VIEW COMPARISON:  None. FINDINGS: No evidence of fracture, dislocation, or joint effusion. No evidence of arthropathy or other focal bone abnormality. Soft tissues are unremarkable. IMPRESSION: Negative. Electronically Signed   By: Myles Rosenthal M.D.   On: 05/17/2017 17:39    Procedures Procedures (including critical care time)  Medications Ordered in ED Medications - No data to display   Initial Impression / Assessment and Plan / ED Course  I have reviewed the triage vital signs and the nursing notes.  Pertinent labs & imaging results that were available during my care of the patient were reviewed by me and considered in my medical decision making (see chart for details).     21yo with rash that began ~3 weeks ago. Intermittently pruritic. Was previously dx with HFM at [place] [facility]. No fever or oral lesions. Today, he states the rash has worsened and is now on his torso x3 days. +exposre to household cleaner and new soap. No tick bites. No fever or sx of illness. Also with bilateral knee pain x2 days. No erythema or swelling to the knees. No known injury but does "stand up a lot for work". Denies limp, inability to ambulate, or numbness/tingling. No meds PTA. Eating/drinking at baseline. Normal UOP.   On exam, he is is well appearing and in NAD. VSS, afebrile. MMM, good distal perfusion. Lungs CTAB. Abdomen soft, NT/ND. Maculopapular rash on torso and lower back. No vesicles. No ttp or drainage. OP clear/moist. Knees with good ROM and are free from erythema,  ttp, or swelling. NVI throughout. Ambulation is normal. Discussed patient w/ Dr. Sondra Come - rash possibly secondary to contact dermatitis. However, plan to obtain baseline labs and x-ray given knee pain w/ no known injury.  UA with protein of 30.  CMP w/ normal Cr and BUN. AST and ALT normal. CBC with WBC 7.1, no left shift. Sed rate elevated at 30, CRP also elevated at 1.5. RMSF and B. Burgdorfi antibodies pending. RPR pending. Rapid HIV reactive.   Consulted with Dr. Luciana Axe, who was on call for infectious disease. He recommends sending HIV RNA, CD4, and GC/chlyamdia. Patient with f/u with infectious disease on outpatient basis. Patient notified of dx/need for follow up by myself as well as Dr. Sondra Come. He denies any questions at this time and is comfortable with discharge home.   Discussed supportive care as well need for f/u w/ PCP in 1-2 days. Also discussed sx that warrant sooner re-eval in ED. Family / patient/ caregiver informed of clinical course, understand medical decision-making process, and agree with plan.  Final Clinical Impressions(s) / ED Diagnoses   Final diagnoses:  HIV (human immunodeficiency virus infection) [facility])    New Prescriptions Discharge Medication List as of 05/17/2017  9:56 PM       Maloy, Illene Regulus, NP 05/17/17 2323    Laban Emperor C, DO 05/18/17 2009

## 2017-05-17 NOTE — ED Notes (Signed)
Lab called to send down another gold top to send off blood for confirmation

## 2017-05-18 LAB — T-HELPER CELLS (CD4) COUNT (NOT AT ARMC)
CD4 % Helper T Cell: 23 % — ABNORMAL LOW (ref 33–55)
CD4 T Cell Abs: 770 /uL (ref 400–2700)

## 2017-05-18 LAB — GC/CHLAMYDIA PROBE AMP (~~LOC~~) NOT AT ARMC
Chlamydia: NEGATIVE
Neisseria Gonorrhea: NEGATIVE

## 2017-05-18 LAB — B. BURGDORFI ANTIBODIES

## 2017-05-19 LAB — ROCKY MTN SPOTTED FVR ABS PNL(IGG+IGM)
RMSF IGG: NEGATIVE
RMSF IgM: 0.63 index (ref 0.00–0.89)

## 2017-05-21 LAB — HIV-1 RNA, QUALITATIVE, TMA: HIV-1 RNA, Qualitative, TMA: POSITIVE — AB

## 2017-05-22 ENCOUNTER — Encounter: Payer: Self-pay | Admitting: Infectious Diseases

## 2017-05-22 ENCOUNTER — Other Ambulatory Visit (HOSPITAL_COMMUNITY)
Admission: RE | Admit: 2017-05-22 | Discharge: 2017-05-22 | Disposition: A | Discharge status: Home / Self Care | Payer: Medicaid Other | Source: Ambulatory Visit | Attending: Infectious Diseases | Admitting: Infectious Diseases

## 2017-05-22 ENCOUNTER — Ambulatory Visit (INDEPENDENT_AMBULATORY_CARE_PROVIDER_SITE_OTHER): Payer: Medicaid Other | Admitting: Infectious Diseases

## 2017-05-22 ENCOUNTER — Telehealth: Payer: Self-pay | Admitting: *Deleted

## 2017-05-22 VITALS — BP 129/78 | HR 78 | Temp 98.4°F | Ht 76.0 in | Wt 245.0 lb

## 2017-05-22 DIAGNOSIS — Z21 Asymptomatic human immunodeficiency virus [HIV] infection status: Secondary | ICD-10-CM

## 2017-05-22 DIAGNOSIS — Z113 Encounter for screening for infections with a predominantly sexual mode of transmission: Secondary | ICD-10-CM | POA: Diagnosis not present

## 2017-05-22 DIAGNOSIS — B20 Human immunodeficiency virus [HIV] disease: Secondary | ICD-10-CM | POA: Insufficient documentation

## 2017-05-22 DIAGNOSIS — Z23 Encounter for immunization: Secondary | ICD-10-CM | POA: Diagnosis not present

## 2017-05-22 DIAGNOSIS — Z0000 Encounter for general adult medical examination without abnormal findings: Secondary | ICD-10-CM | POA: Diagnosis not present

## 2017-05-22 DIAGNOSIS — J45909 Unspecified asthma, uncomplicated: Secondary | ICD-10-CM | POA: Insufficient documentation

## 2017-05-22 DIAGNOSIS — R21 Rash and other nonspecific skin eruption: Secondary | ICD-10-CM | POA: Insufficient documentation

## 2017-05-22 MED ORDER — BICTEGRAVIR-EMTRICITAB-TENOFOV 50-200-25 MG PO TABS: 1.0000 | ORAL_TABLET | Freq: Every day | ORAL | 5 refills | Status: DC

## 2017-05-22 MED FILL — BIKTARVY 50-200-25 MG TABS: 50-200-25 | 30 days supply | Qty: 30 | Fill #0

## 2017-05-22 NOTE — Progress Notes (Signed)
HPI: Gerald Lee is a 21 y.o. male who presents to the [facility] clinic today to initiate care with Gerald Lee for his newly diagnosed HIV infection.   Allergies: No Known Allergies  Past Medical History: Past Medical History:  Diagnosis Date  . Asthma   . Headache(784.0)   . HIV infection [facility])     Social History: Social History   Social History  . Marital status: Single    Spouse name: N/A  . Number of children: N/A  . Years of education: N/A   Social History Main Topics  . Smoking status: Never Smoker  . Smokeless tobacco: Never Used  . Alcohol use Yes     Comment: occ  . Drug use: Yes    Types: Marijuana  . Sexual activity: Yes    Partners: Male    Birth control/ protection: Condom   Other Topics Concern  . None   Social History Narrative  . None    Current Regimen: None  Labs: CD4 T Cell Abs (/uL)  Date Value  05/17/2017 770    CrCl: Estimated Creatinine Clearance: 160.8 mL/min (by C-G formula based on SCr of 1 mg/dL).  Lipids: No results found for: CHOL, TRIG, HDL, CHOLHDL, VLDL, LDLCALC  Assessment: Gerald Lee is here today to initiate care for his newly diagnosed HIV infection that he found out he had ~5 days ago.  He is accompanied by his sister to today's appointment.  He is a MSM who currently has one stable partner.  We decided to start him on Biktarvy.  I spent some time going over HIV and Biktarvy.  I emphasized the need to take the medication every day around the same time. I also touched on resistance and how that develops. He asked if we could test his partner, and I told him we certainly could.  If he is HIV negative, we will start him on PrEP.  He will call me some time this week about when he can bring his partner in.  I will bring Gerald Lee back in 3-4 weeks to see me for adherence counseling, side effect monitoring, and labs.  Plans: - Start Biktarvy PO once daily - Call me regarding partner testing/PrEP - F/u with me again 10/17 at  11am  Cassie L. Kuppelweiser, PharmD, CPP Infectious Diseases Clinical Pharmacist [facility] 05/22/2017, 12:12 PM

## 2017-05-22 NOTE — Assessment & Plan Note (Addendum)
Flu shot today. Will need Pneumovax when he meets with pharmacy in a few weeks. Assess hepatitis a/b immunity today. Menveo series at upcoming appts. Uncertain as to if he had HPV series - will discuss further at future visits. Condoms provided today. Will need to be set up in dental clinic.

## 2017-05-22 NOTE — Assessment & Plan Note (Signed)
I discussed with Gerald Lee treatment options/side effects, benefits of treatment and long-term outcomes. I discussed how HIV is transmitted and the process of untreated HIV including increased risk for opportunistic infections, cancer, dementia and renal failure. He was counseled on routine HIV care including medication adherence, blood monitoring, necessary vaccines and follow up visits. Counseled regarding safe sex practices including: condom use, partner disclosure, limiting partners and potential for PrEP. He spent time talking with our pharmacist Cassie regarding successful practices of ART and understands to reach out to our clinic in the future with questions.   I decided to start him on Biktarvy today for simplicity of STR with high barrier to resistance. He has medicaid and will receive from [facility]. Will return to clinic in 8 weeks to review lab work and continue HIV education. He will have a visit with pharmacy team in a few weeks to assess medication adherence/tolerability.   I introduced him to Amy with [facility] today, Kelby Fam for social support as well as Cordelia Pen for emotional support.   I spent greater than 45 minutes with the patient today. Greater than 50% of the time spent face-to-face counseling and coordination of care re: HIV and health maintenance.

## 2017-05-22 NOTE — Progress Notes (Signed)
Patient Active Problem List   Diagnosis Date Noted  . HIV (human immunodeficiency virus infection) ([place]) 05/22/2017  . Macular rash 05/22/2017  . Healthcare maintenance 05/22/2017  . Migraine without aura, without mention of intractable migraine without mention of status migrainosus 03/14/2014  . Episodic tension type headache 03/14/2014  . Obesity, unspecified 03/14/2014  . Insomnia, unspecified 03/14/2014    Patient's Medications  New Prescriptions   BICTEGRAVIR-EMTRICITABINE-TENOFOVIR AF (BIKTARVY) 50-200-25 MG TABS TABLET    Take 1 tablet by mouth daily.  Previous Medications   IBUPROFEN (ADVIL,MOTRIN) 800 MG TABLET    Take 800 mg by mouth every 8 (eight) hours as needed.  Modified Medications   No medications on file  Discontinued Medications   AMOXICILLIN-CLAVULANATE (AUGMENTIN) 875-125 MG TABLET    Take 1 tablet by mouth every 12 (twelve) hours.   METHOCARBAMOL (ROBAXIN) 500 MG TABLET    Take 1 tablet (500 mg total) by mouth at bedtime as needed for muscle spasms.   NAPROXEN (NAPROSYN) 500 MG TABLET    Take 1 tablet (500 mg total) by mouth 2 (two) times daily.   ONDANSETRON (ZOFRAN ODT) 8 MG DISINTEGRATING TABLET    Take 1 tablet (8 mg total) by mouth every 8 (eight) hours as needed for nausea or vomiting.    Subjective: Gerald Lee is here today for his first visit for HIV care. His sister Gerald Lee is present during the visit today.   HIV = This is a new diagnosis for him. Has been to ED a few times recently for various complaints. Recently on 9/5 was seen at [facility] for rash that was present one week. No associated fevers, pruritis at the time; dx with hand foot and mouth (recently exposed at work). Was seen again on 9/19 for progression of rash to whole torso and knee pain. HIV screening was positive. Previously tested negative in 2016. Risk factors for HIV/STI include MSM, infrequent condom use. Exclusively male partners with receptive/insertive anal intercourse. Has  one partner currently but in the past had high number of new sexual partners. Endorses no complaints today suggestive of associated opportunistic infection or advancing HIV disease such as fevers, night sweats, weight loss, anorexia, cough, SOB, nausea, vomiting, diarrhea, headache, sensory changes, lymphadenopathy or oral thrush.   Rash = He does endorse progressive rash now present for about 20 days now and has progressed to the palms of his hands and sole of his left foot. Associated with mild occasional pruritis. Has not taken anything for symptoms and uncertain as to the cause of it.   Knee pain = present for about 1.5 weeks or so. Reported as bilateral at initial presentation however now only localized to right leg. Reports the pain to be distal to the patella and on upper anterior shin. Tender to palpation. Intermittent pain. Tells me he had x-rays at the hospital and only reported inflammation but otherwise normal. 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 No known injury to explain sx.   Health Maintenance = Works full-time in call center. Occ marijuana use. No cigarettes and minimal alcohol use. Does not exercise consistently. Uncertain as to childhood vaccine history and not much is known about biological parents health. Denies any chronic medical conditions.     Review of Systems: Review of Systems  Constitutional: Negative for chills, fever, malaise/fatigue and weight loss.  HENT: Negative for sore throat.  No dental problems  Respiratory: Negative for cough and sputum production.   Cardiovascular: Negative for chest pain and leg swelling.  Gastrointestinal: Negative for abdominal pain, diarrhea and vomiting.  Genitourinary: Negative for dysuria and flank pain.  Musculoskeletal: Positive for joint pain. Negative for myalgias and neck pain.  Skin: Positive for rash.  Neurological: Positive for dizziness and headaches.  Negative for tingling.  Psychiatric/Behavioral: Negative for depression and substance abuse. The patient is not nervous/anxious and does not have insomnia.     Past Medical History:  Diagnosis Date  . Asthma   . Headache(784.0)   . HIV infection [facility])     Social History  Substance Use Topics  . Smoking status: Never Smoker  . Smokeless tobacco: Never Used  . Alcohol use Yes     Comment: occ    Family History  Problem Relation Age of Onset  . Stroke Mother   . Hypertension Mother   . Diabetes Father     No Known Allergies    Current Outpatient Prescriptions:  .  ibuprofen (ADVIL,MOTRIN) 800 MG tablet, Take 800 mg by mouth every 8 (eight) hours as needed., Disp: , Rfl:  .  bictegravir-emtricitabine-tenofovir AF (BIKTARVY) 50-200-25 MG TABS tablet, Take 1 tablet by mouth daily., Disp: 30 tablet, Rfl: 5  Objective: Physical Exam  Constitutional: He is oriented to person, place, and time and well-developed, well-nourished, and in no distress.  Healthy appearing Luca male. In good spirits overall. Sister present.   HENT:  Mouth/Throat: Oropharynx is clear and moist. No oral lesions. Normal dentition. No dental caries.  Eyes: No scleral icterus.  Cardiovascular: Normal rate, regular rhythm and normal heart sounds.   Pulmonary/Chest: Effort normal and breath sounds normal.  Abdominal: Soft. He exhibits no distension. There is no tenderness.  Musculoskeletal: Normal range of motion. He exhibits no edema or deformity.       Legs: Lymphadenopathy:    He has no cervical adenopathy.  Neurological: He is alert and oriented to person, place, and time.  Skin: Skin is warm and dry. Rash noted.  Macular / lacy pink/red rash present over torso and upper extremities. Few hyperpigmented macules scattered to palms.   Psychiatric: Mood and affect normal.   Vitals:   05/22/17 1113  BP: 129/78  Pulse: 78  Temp: 98.4 F (36.9 C)    Lab Results Lab Results  Component Value Date    WBC 5.6 05/22/2017   HGB 14.3 05/22/2017   HCT 43.5 05/22/2017   MCV 80.4 05/22/2017   PLT 228 05/22/2017    Lab Results  Component Value Date   CREATININE 0.85 05/22/2017   BUN 14 05/22/2017   NA 137 05/22/2017   K 4.4 05/22/2017   CL 102 05/22/2017   CO2 24 05/22/2017    Lab Results  Component Value Date   ALT 22 05/22/2017   AST 19 05/22/2017   ALKPHOS 135 (H) 05/17/2017   BILITOT 0.5 05/22/2017    Lab Results  Component Value Date   CHOL 122 05/22/2017   HDL 21 (L) 05/22/2017   TRIG 86 05/22/2017   CHOLHDL 5.8 (H) 05/22/2017   CD4 T Cell Abs (/uL)  Date Value  05/17/2017 770   No results found for: HIV1GENOSEQ No results found for: HAV No results found for: HEPBSAG, HEPBSAB No results found for: HCVAB Lab Results  Component Value Date   CHLAMYDIAWP Negative 05/17/2017   N Negative 05/17/2017   No results found for: GCPROBEAPT No results found for: QUANTGOLD  I have reviewed all available documents of his medical record.    Assessment and Plan:  Problem List Items Addressed This Visit      Musculoskeletal and Integument   Macular rash    Syphilis vs gonorrhea (rash + joint pain) vs acute HIV rash?   I am very suspicious this is syphilis. RPR was drawn in ER 5 days ago however not resulted. Will check today in clinic incase something happened to the sample as it has had ample time to result by now. Will also have him perform oral and rectal swabs for GC/C.         Other   Healthcare maintenance    Flu shot today. Will need Pneumovax when he meets with pharmacy in a few weeks. Assess hepatitis a/b immunity today. Menveo series at upcoming appts. Uncertain as to if he had HPV series - will discuss further at future visits. Condoms provided today. Will need to be set up in dental clinic.       HIV (human immunodeficiency virus infection) ([place]) - Primary    I discussed with Joyice Faster Vitt[place]ow treatment options/side effects, benefits of treatment and  long-term outcomes. I discussed how HIV is transmitted and the process of untreated HIV including increased risk for opportunistic infections, cancer, dementia and renal failure. He was counseled on routine HIV care including medication adherence, blood monitoring, necessary vaccines and follow up visits. Counseled regarding safe sex practices including: condom use, partner disclosure, limiting partners and potential for PrEP. He spent time talking with our pharmacist Cassie regarding successful practices of ART and understands to reach out to our clinic in the future with questions.   I decided to start him on Biktarvy today for simplicity of STR with high barrier to resistance. He has medicaid and will receive from [place]. Will return to clinic in 8 weeks to review lab work and continue HIV education. He will have a visit with pharmacy team in a few weeks to assess medication adherence/tolerability.   I introduced him to Amy with [facility] today, Audelia Hives for social support as well as Judeen Hammans for emotional support.   I spent greater than 45 minutes with the patient today. Greater than 50% of the time spent face-to-face counseling and coordination of care re: HIV and health maintenance.        Relevant Medications   bictegravir-emtricitabine-tenofovir AF (BIKTARVY) 50-200-25 MG TABS tablet   Other Relevant Orders   HIV-1 RNA ultraquant reflex to gentyp+   Cytology (oral, anal, urethral) ancillary only   RPR   COMPLETE METABOLIC PANEL WITH GFR (Completed)   CBC with Differential/Platelet (Completed)   Lipid panel (Completed)   HLA B*5701   Quantiferon tb gold assay (blood)   Cytology (oral, anal, urethral) ancillary only   Hepatitis A antibody, total   Hepatitis B surface antibody   Hepatitis B surface antigen   Hepatitis C antibody    Other Visit Diagnoses    Screening for STDs (sexually transmitted diseases)       Relevant Orders   HIV-1 RNA ultraquant reflex to gentyp+   COMPLETE  METABOLIC PANEL WITH GFR (Completed)   CBC with Differential/Platelet (Completed)   Lipid panel (Completed)   HLA B*5701   Quantiferon tb gold assay (blood)   Hepatitis A antibody, total   Hepatitis B surface antibody   Hepatitis B surface antigen   Hepatitis C antibody   Need for immunization against influenza       Relevant Orders  Flu Vaccine QUAD 36+ mos IM (Completed)     Partner to come in for partner testing and PrEP.   Will need to discuss further re: headaches/dizziness (he discussed with CMA who later informed me of this complaint). 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 Possible he had concomitant chancre ulcer. May need LP to r/o neurosyphilis if RPR +.   SJanene Madeira MSN, NP-C [facility]  05/22/17 13:00

## 2017-05-22 NOTE — Telephone Encounter (Signed)
Patient needs a form for work telling them the date he was diagnosed until today, so he does not accumulate points. It is not FMLA because he does not qualify for that yet. He will get the form from work and bring it back to this office for [place] to fill out. [name]

## 2017-05-22 NOTE — Patient Instructions (Signed)
We are going to start you on a new medication today called Biktarvy   Please take this every day with or without food.   Will have you come back in 4 weeks to meet with Pharmacy team and see Gerald Lee again in 2 months  Will check your blood work today and call you with results. You may need to come back for a nurse visit for treatment.

## 2017-05-22 NOTE — Assessment & Plan Note (Signed)
Syphilis vs gonorrhea (rash + joint pain) vs acute HIV rash?   I am very suspicious this is syphilis. RPR was drawn in ER 5 days ago however not resulted. Will check today in clinic incase something happened to the sample as it has had ample time to result by now. Will also have him perform oral and rectal swabs for GC/C.

## 2017-05-23 ENCOUNTER — Telehealth: Payer: Self-pay | Admitting: Infectious Diseases

## 2017-05-23 ENCOUNTER — Telehealth: Payer: Self-pay | Admitting: *Deleted

## 2017-05-23 DIAGNOSIS — A539 Syphilis, unspecified: Secondary | ICD-10-CM

## 2017-05-23 DIAGNOSIS — R51 Headache: Secondary | ICD-10-CM

## 2017-05-23 DIAGNOSIS — R519 Headache, unspecified: Secondary | ICD-10-CM

## 2017-05-23 LAB — CYTOLOGY, (ORAL, ANAL, URETHRAL) ANCILLARY ONLY
CHLAMYDIA, DNA PROBE: POSITIVE — AB
Chlamydia: NEGATIVE
NEISSERIA GONORRHEA: NEGATIVE
Neisseria Gonorrhea: NEGATIVE

## 2017-05-23 NOTE — Addendum Note (Signed)
Addended by: Blanchard Kelch on: 05/23/2017 03:49 PM   Modules accepted: Orders

## 2017-05-23 NOTE — Telephone Encounter (Addendum)
Called and LVM requesting call back to discuss plan and lab work.   RPR + with titer 1:64 and has rash currently. Had an ER encounter in 2016 for penile lesions. He was seen in ED recently with headaches. Does have a known history of them documented since 2015. I need to discuss with him further about these and any other neuro symptoms (dizziness, vision changes / floaters, etc). He may need LP to rule out neurosyphilis.   Otherwise with documented penile lesion in 2016 would treat with bicillin 2.4 mil units x 3 assuming he may have had a primary syphilis then.    Rexene Alberts, NP     ADDENDUM: Patient called back at 2:00 pm and we discussed plan above. Informed him he was OK to continue working until his procedure.

## 2017-05-23 NOTE — Telephone Encounter (Signed)
Patient stated he was dropping off forms for work today. He said he told Human Resources that we, "found syphilis in my blood" and was told he could not return to work until he is cleared. Gerald Lee

## 2017-05-24 LAB — REFLEX TO GENOSURE(R) MG
HIV GENOSURE(R) MG PDF: UNDETERMINED
HIV GENOSURE(R): UNDETERMINED
HIV GENOSURE: UNDETERMINED

## 2017-05-24 LAB — HIV-1 RNA ULTRAQUANT REFLEX TO GENTYP+
HIV-1 RNA BY PCR: 15800 copies/mL
HIV-1 RNA QUANT, LOG: 4.199 {Log_copies}/mL

## 2017-05-24 NOTE — Telephone Encounter (Signed)
He certainly does not need to stay out of work for this - which we discussed on the phone that he can work up until his procedure then once we determine treatment (ie: PICC line vs just shots) we will see what we need to do about work.   He also needs to come in to have treatment for chlamydia that we found on one of his swabs - 1 gm of azithromycin PO please. He can stop by before his LP if that is OK for clinic schedule and his.   Thanks Annice Pih.

## 2017-05-24 NOTE — Telephone Encounter (Signed)
Patient notified and scheduled for nurse visit tomorrow. Gerald Lee will fill out paperwork, however it is not needed until 06/07/17. This will be just to accommodate future office visits. [name]

## 2017-05-25 ENCOUNTER — Ambulatory Visit (HOSPITAL_COMMUNITY)
Admission: RE | Admit: 2017-05-25 | Discharge: 2017-05-25 | Disposition: A | Discharge status: Home / Self Care | Payer: Medicaid Other | Source: Ambulatory Visit | Attending: Infectious Diseases | Admitting: Infectious Diseases

## 2017-05-25 ENCOUNTER — Ambulatory Visit (INDEPENDENT_AMBULATORY_CARE_PROVIDER_SITE_OTHER): Payer: Medicaid Other | Admitting: *Deleted

## 2017-05-25 DIAGNOSIS — R51 Headache: Secondary | ICD-10-CM | POA: Insufficient documentation

## 2017-05-25 DIAGNOSIS — A539 Syphilis, unspecified: Secondary | ICD-10-CM | POA: Insufficient documentation

## 2017-05-25 DIAGNOSIS — Z113 Encounter for screening for infections with a predominantly sexual mode of transmission: Secondary | ICD-10-CM

## 2017-05-25 DIAGNOSIS — R519 Headache, unspecified: Secondary | ICD-10-CM

## 2017-05-25 LAB — CSF CELL COUNT WITH DIFFERENTIAL
RBC COUNT CSF: 195 /mm3 — AB
TUBE #: 1
WBC, CSF: 3 /mm3 (ref 0–5)

## 2017-05-25 LAB — PROTEIN, CSF: TOTAL PROTEIN, CSF: 35 mg/dL (ref 15–45)

## 2017-05-25 LAB — GLUCOSE, CSF: Glucose, CSF: 57 mg/dL (ref 40–70)

## 2017-05-25 MED ORDER — AZITHROMYCIN 250 MG PO TABS
1000.0000 mg | ORAL_TABLET | Freq: Once | ORAL | Status: AC
  Administered 2017-05-25: 1000 mg via ORAL

## 2017-05-25 MED ORDER — LIDOCAINE HCL (PF) 1 % IJ SOLN
INTRAMUSCULAR | Status: AC
  Administered 2017-05-25: 5 mL via INTRADERMAL
  Filled 2017-05-25: qty 5

## 2017-05-25 MED ORDER — LIDOCAINE HCL (PF) 1 % IJ SOLN
5.0000 mL | Freq: Once | INTRAMUSCULAR | Status: AC
  Administered 2017-05-25: 5 mL via INTRADERMAL

## 2017-05-25 NOTE — Procedures (Signed)
Fluoro guided LP performed at L3-L4.  Opening pressure 11 cm H20.  7.5 mL clear CSF obtained and sent to laboratory per orders of primary medical team.  No acute complications.  Please see dictation in PACS for full details.

## 2017-05-25 NOTE — Discharge Instructions (Signed)
Lumbar Puncture, Care After °Refer to this sheet in the next few weeks. These instructions provide you with information on caring for yourself after your procedure. Your health care provider may also give you more specific instructions. Your treatment has been planned according to current medical practices, but problems sometimes occur. Call your health care provider if you have any problems or questions after your procedure. °What can I expect after the procedure? °After your procedure, it is typical to have the following sensations: °· Mild discomfort or pain at the insertion site. °· Mild headache that is relieved with pain medicines. ° °Follow these instructions at home: ° °· Avoid lifting anything heavier than 10 lb (4.5 kg) for at least 12 hours after the procedure. °· Drink enough fluids to keep your urine clear or pale yellow. °Contact a health care provider if: °· You have fever or chills. °· You have nausea or vomiting. °· You have a headache that lasts for more than 2 days. °Get help right away if: °· You have any numbness or tingling in your legs. °· You are unable to control your bowel or bladder. °· You have bleeding or swelling in your back at the insertion site. °· You are dizzy or faint. °This information is not intended to replace advice given to you by your health care provider. Make sure you discuss any questions you have with your health care provider. °Document Released: 08/20/2013 Document Revised: 01/21/2016 Document Reviewed: 04/23/2013 °Elsevier Interactive Patient Education © 2017 Elsevier Inc. ° °

## 2017-05-26 ENCOUNTER — Ambulatory Visit (INDEPENDENT_AMBULATORY_CARE_PROVIDER_SITE_OTHER): Payer: Medicaid Other | Admitting: *Deleted

## 2017-05-26 ENCOUNTER — Telehealth: Payer: Self-pay | Admitting: *Deleted

## 2017-05-26 DIAGNOSIS — A539 Syphilis, unspecified: Secondary | ICD-10-CM | POA: Diagnosis not present

## 2017-05-26 LAB — COMPLETE METABOLIC PANEL WITH GFR
AG RATIO: 1 (calc) (ref 1.0–2.5)
ALKALINE PHOSPHATASE (APISO): 131 U/L — AB (ref 40–115)
ALT: 22 U/L (ref 9–46)
AST: 19 U/L (ref 10–40)
Albumin: 4.2 g/dL (ref 3.6–5.1)
BUN: 14 mg/dL (ref 7–25)
CALCIUM: 9.6 mg/dL (ref 8.6–10.3)
CO2: 24 mmol/L (ref 20–32)
CREATININE: 0.85 mg/dL (ref 0.60–1.35)
Chloride: 102 mmol/L (ref 98–110)
GFR, EST NON AFRICAN AMERICAN: 125 mL/min/{1.73_m2} (ref >=60)
GFR, Est African American: 145 mL/min/{1.73_m2} (ref >=60)
GLOBULIN: 4.1 g/dL — AB (ref 1.9–3.7)
Glucose, Bld: 88 mg/dL (ref 65–99)
POTASSIUM: 4.4 mmol/L (ref 3.5–5.3)
SODIUM: 137 mmol/L (ref 135–146)
Total Bilirubin: 0.5 mg/dL (ref 0.2–1.2)
Total Protein: 8.3 g/dL — ABNORMAL HIGH (ref 6.1–8.1)

## 2017-05-26 LAB — HEPATITIS C ANTIBODY
Hepatitis C Ab: NONREACTIVE
SIGNAL TO CUT-OFF: 0.07 (ref <1.00)

## 2017-05-26 LAB — CBC WITH DIFFERENTIAL/PLATELET
BASOS PCT: 0.5 %
Basophils Absolute: 28 cells/uL (ref 0–200)
Eosinophils Absolute: 62 cells/uL (ref 15–500)
Eosinophils Relative: 1.1 %
HCT: 43.5 % (ref 38.5–50.0)
HEMOGLOBIN: 14.3 g/dL (ref 13.2–17.1)
Lymphs Abs: 2218 cells/uL (ref 850–3900)
MCH: 26.4 pg — ABNORMAL LOW (ref 27.0–33.0)
MCHC: 32.9 g/dL (ref 32.0–36.0)
MCV: 80.4 fL (ref 80.0–100.0)
MONOS PCT: 7.7 %
MPV: 10.8 fL (ref 7.5–12.5)
NEUTROS ABS: 2862 {cells}/uL (ref 1500–7800)
Neutrophils Relative %: 51.1 %
Platelets: 228 10*3/uL (ref 140–400)
RBC: 5.41 10*6/uL (ref 4.20–5.80)
RDW: 13.1 % (ref 11.0–15.0)
TOTAL LYMPHOCYTE: 39.6 %
WBC mixed population: 431 cells/uL (ref 200–950)
WBC: 5.6 10*3/uL (ref 3.8–10.8)

## 2017-05-26 LAB — LIPID PANEL
CHOLESTEROL: 122 mg/dL (ref <200)
HDL: 21 mg/dL — AB (ref >40)
LDL Cholesterol (Calc): 83 mg/dL (calc)
NON-HDL CHOLESTEROL (CALC): 101 mg/dL (ref <130)
TRIGLYCERIDES: 86 mg/dL (ref <150)
Total CHOL/HDL Ratio: 5.8 (calc) — ABNORMAL HIGH (ref <5.0)

## 2017-05-26 LAB — FLUORESCENT TREPONEMAL AB(FTA)-IGG-BLD: FLUORESCENT TREPONEMAL ABS: REACTIVE — AB

## 2017-05-26 LAB — QUANTIFERON TB GOLD ASSAY (BLOOD)
Mitogen-Nil: >10 IU/mL
QUANTIFERON NIL VALUE: 0.06 [IU]/mL
QUANTIFERON(R)-TB GOLD: NEGATIVE
Quantiferon Tb Ag Minus Nil Value: 0.01 IU/mL

## 2017-05-26 LAB — HEPATITIS A ANTIBODY, TOTAL: Hepatitis A AB,Total: REACTIVE — AB

## 2017-05-26 LAB — HLA B*5701: HLA-B*5701 w/rflx HLA-B High: NEGATIVE

## 2017-05-26 LAB — RPR TITER

## 2017-05-26 LAB — HEPATITIS B SURFACE ANTIBODY,QUALITATIVE: HEP B S AB: NONREACTIVE

## 2017-05-26 LAB — RPR: RPR: REACTIVE — AB

## 2017-05-26 LAB — HEPATITIS B SURFACE ANTIGEN: Hepatitis B Surface Ag: NONREACTIVE

## 2017-05-26 LAB — VDRL, CSF: VDRL Quant, CSF: NONREACTIVE

## 2017-05-26 MED ORDER — PENICILLIN G BENZATHINE 1200000 UNIT/2ML IM SUSP
1.2000 10*6.[IU] | Freq: Once | INTRAMUSCULAR | Status: AC
  Administered 2017-05-26: 1.2 10*6.[IU] via INTRAMUSCULAR

## 2017-05-26 NOTE — Telephone Encounter (Signed)
Patient notified and will come today for treatment and to pick up paperwork.

## 2017-05-26 NOTE — Telephone Encounter (Signed)
Left patient a voice mail to return the call. 

## 2017-05-26 NOTE — Telephone Encounter (Signed)
-----   Message from Blanchard Kelch, NP sent at 05/25/2017  9:42 PM EDT ----- Preliminarily the lumbar puncture test is very encouraging he does not have infection in his spinal fluid. VDRL still pending. Gerald Lee should come to clinic for treatment with Bicillin 2.4 million units IM x 1. If the last test comes back negative he needs no further treatment for now. Please reinforce to him that he can still be reinfected if he does not use condoms.   Thank you

## 2017-05-28 LAB — CSF CULTURE
CULTURE: NO GROWTH
GRAM STAIN: NONE SEEN

## 2017-05-29 ENCOUNTER — Telehealth: Payer: Self-pay | Admitting: *Deleted

## 2017-05-29 NOTE — Telephone Encounter (Signed)
-----   Message from Blanchard Kelch, NP sent at 05/26/2017  3:26 PM EDT ----- No signs of neurosyphilis. LVM on Friday to patient's cell phone for call back. Can you please try to call him again next week? Thank you

## 2017-05-29 NOTE — Telephone Encounter (Signed)
Left voice mail to return call for lab results. Gerald Lee

## 2017-05-29 NOTE — Telephone Encounter (Signed)
Patient called back and notified.

## 2017-05-30 ENCOUNTER — Other Ambulatory Visit: Payer: Self-pay | Admitting: Pharmacist

## 2017-05-30 LAB — ANAEROBIC CULTURE

## 2017-06-03 LAB — HIV-1 RNA ULTRAQUANT REFLEX TO GENTYP+
HIV 1 RNA QUANT: 10400 {copies}/mL — AB
HIV-1 RNA Quant, Log: 4.02 Log cps/mL — ABNORMAL HIGH

## 2017-06-03 LAB — RFLX HIV-1 INTEGRASE GENOTYPE: HIV-1 Genotype: DETECTED — AB

## 2017-06-06 ENCOUNTER — Telehealth: Payer: Self-pay | Admitting: Pharmacist

## 2017-06-06 NOTE — Telephone Encounter (Signed)
error 

## 2017-06-08 ENCOUNTER — Ambulatory Visit: Payer: Medicaid Other | Admitting: Internal Medicine

## 2017-06-14 ENCOUNTER — Ambulatory Visit: Payer: Medicaid Other

## 2017-06-16 MED FILL — BIKTARVY 50-200-25 MG TABS: 50-200-25 | 30 days supply | Qty: 30 | Fill #1

## 2017-06-20 ENCOUNTER — Encounter (HOSPITAL_COMMUNITY): Payer: Self-pay | Admitting: Emergency Medicine

## 2017-06-20 ENCOUNTER — Emergency Department (HOSPITAL_COMMUNITY)
Admission: EM | Admit: 2017-06-20 | Discharge: 2017-06-21 | Disposition: A | Discharge status: Other Acute Inpatient Hospital | Payer: Medicaid Other | Attending: Emergency Medicine | Admitting: Emergency Medicine

## 2017-06-20 DIAGNOSIS — B20 Human immunodeficiency virus [HIV] disease: Secondary | ICD-10-CM | POA: Insufficient documentation

## 2017-06-20 DIAGNOSIS — Z046 Encounter for general psychiatric examination, requested by authority: Secondary | ICD-10-CM | POA: Insufficient documentation

## 2017-06-20 DIAGNOSIS — Z79899 Other long term (current) drug therapy: Secondary | ICD-10-CM | POA: Insufficient documentation

## 2017-06-20 DIAGNOSIS — Z23 Encounter for immunization: Secondary | ICD-10-CM | POA: Insufficient documentation

## 2017-06-20 DIAGNOSIS — F329 Major depressive disorder, single episode, unspecified: Secondary | ICD-10-CM | POA: Insufficient documentation

## 2017-06-20 DIAGNOSIS — X789XXA Intentional self-harm by unspecified sharp object, initial encounter: Secondary | ICD-10-CM | POA: Insufficient documentation

## 2017-06-20 DIAGNOSIS — F32A Depression, unspecified: Secondary | ICD-10-CM

## 2017-06-20 DIAGNOSIS — F17210 Nicotine dependence, cigarettes, uncomplicated: Secondary | ICD-10-CM | POA: Insufficient documentation

## 2017-06-20 DIAGNOSIS — J45909 Unspecified asthma, uncomplicated: Secondary | ICD-10-CM | POA: Insufficient documentation

## 2017-06-20 DIAGNOSIS — R45851 Suicidal ideations: Secondary | ICD-10-CM | POA: Insufficient documentation

## 2017-06-20 LAB — CBC
HCT: 41.6 % (ref 39.0–52.0)
Hemoglobin: 13.9 g/dL (ref 13.0–17.0)
MCH: 27.5 pg (ref 26.0–34.0)
MCHC: 33.4 g/dL (ref 30.0–36.0)
MCV: 82.4 fL (ref 78.0–100.0)
PLATELETS: 194 10*3/uL (ref 150–400)
RBC: 5.05 MIL/uL (ref 4.22–5.81)
RDW: 14.4 % (ref 11.5–15.5)
WBC: 6.6 10*3/uL (ref 4.0–10.5)

## 2017-06-20 LAB — RAPID URINE DRUG SCREEN, HOSP PERFORMED
AMPHETAMINES: NOT DETECTED
BENZODIAZEPINES: NOT DETECTED
Barbiturates: NOT DETECTED
COCAINE: NOT DETECTED
Opiates: NOT DETECTED
Tetrahydrocannabinol: POSITIVE — AB

## 2017-06-20 LAB — SALICYLATE LEVEL: Salicylate Lvl: <7 mg/dL (ref 2.8–30.0)

## 2017-06-20 LAB — COMPREHENSIVE METABOLIC PANEL
ALBUMIN: 4.3 g/dL (ref 3.5–5.0)
ALT: 15 U/L — AB (ref 17–63)
AST: 19 U/L (ref 15–41)
Alkaline Phosphatase: 89 U/L (ref 38–126)
Anion gap: 8 (ref 5–15)
BUN: 10 mg/dL (ref 6–20)
CHLORIDE: 106 mmol/L (ref 101–111)
CO2: 25 mmol/L (ref 22–32)
CREATININE: 1.17 mg/dL (ref 0.61–1.24)
Calcium: 9.4 mg/dL (ref 8.9–10.3)
GFR calc Af Amer: >60 mL/min (ref >60)
GLUCOSE: 111 mg/dL — AB (ref 65–99)
POTASSIUM: 3.7 mmol/L (ref 3.5–5.1)
SODIUM: 139 mmol/L (ref 135–145)
Total Bilirubin: 1.2 mg/dL (ref 0.3–1.2)
Total Protein: 7.6 g/dL (ref 6.5–8.1)

## 2017-06-20 LAB — ETHANOL

## 2017-06-20 LAB — ACETAMINOPHEN LEVEL: Acetaminophen (Tylenol), Serum: <10 ug/mL — ABNORMAL LOW (ref 10–30)

## 2017-06-20 MED ORDER — TETANUS-DIPHTH-ACELL PERTUSSIS 5-2.5-18.5 LF-MCG/0.5 IM SUSP
0.5000 mL | Freq: Once | INTRAMUSCULAR | Status: AC
  Administered 2017-06-20: 0.5 mL via INTRAMUSCULAR
  Filled 2017-06-20: qty 0.5

## 2017-06-20 NOTE — BHH Counselor (Signed)
Pt's mother new phone number Corrie Dandy([place], [phone]   Redmond Pullingreylese D Syliva Mee, MS, [facility], [facility] Triage Specialist [phone]

## 2017-06-20 NOTE — ED Provider Notes (Signed)
[facility]-EMERGENCY DEPT Provider Note   CSN: [id number] Arrival date & time: 06/20/17  1731     History   Chief Complaint Chief Complaint  Patient presents with  . Depression  . Suicidal    HPI Gerald Lee is a 21 y.o. male.  The history is provided by the patient, a parent and a relative.  Mental Health Problem  Presenting symptoms: depression and suicidal thoughts   Presenting symptoms: no homicidal ideas, no suicidal threats and no suicide attempt   Patient accompanied by:  Family member Degree of incapacity (severity):  Severe Onset quality:  Gradual Timing:  Constant Progression:  Waxing and waning Chronicity:  Recurrent Context: not alcohol use and not medication   Treatment compliance:  Untreated Worsened by:  Nothing Ineffective treatments:  None tried Associated symptoms: no abdominal pain, no chest pain, no fatigue and no headaches   Risk factors: hx of mental illness     Past Medical History:  Diagnosis Date  . Asthma   . Headache(784.0)   . HIV infection [facility](HCC)     Patient Active Problem List   Diagnosis Date Noted  . HIV (human immunodeficiency virus infection) (HCC) 05/22/2017  . Macular rash 05/22/2017  . Healthcare maintenance 05/22/2017  . Migraine without aura, without mention of intractable migraine without mention of status migrainosus 03/14/2014  . Episodic tension type headache 03/14/2014  . Obesity, unspecified 03/14/2014  . Insomnia, unspecified 03/14/2014    Past Surgical History:  Procedure Laterality Date  . CIRCUMCISION  1998  . HERNIA REPAIR     Done between the ages of 89 or 8 years   . NO PAST SURGERIES         Home Medications    Prior to Admission medications   Medication Sig Start Date End Date Taking? Authorizing Provider  bictegravir-emtricitabine-tenofovir AF (BIKTARVY) 50-200-25 MG TABS tablet Take 1 tablet by mouth daily. 05/22/17  Yes Blanchard Kelchixon, Stephanie N, NP  ibuprofen (ADVIL,MOTRIN)  800 MG tablet Take 800 mg by mouth every 8 (eight) hours as needed.   Yes [provider]    Family History Family History  Problem Relation Age of Onset  . Stroke Mother   . Hypertension Mother   . Diabetes Father     Social History Social History  Substance Use Topics  . Smoking status: Current Every Day Smoker    Types: Cigarettes  . Smokeless tobacco: Never Used  . Alcohol use Yes     Comment: occ     Allergies   Patient has no known allergies.   Review of Systems Review of Systems  Constitutional: Negative for activity change, chills, diaphoresis, fatigue and fever.  HENT: Negative for congestion and rhinorrhea.   Eyes: Negative for visual disturbance.  Respiratory: Negative for cough, chest tightness, shortness of breath, wheezing and stridor.   Cardiovascular: Negative for chest pain, palpitations and leg swelling.  Gastrointestinal: Negative for abdominal distention, abdominal pain, blood in stool, constipation, diarrhea, nausea and vomiting.  Genitourinary: Negative for difficulty urinating, dysuria and flank pain.  Musculoskeletal: Negative for back pain and gait problem.  Skin: Negative for rash and wound.  Neurological: Negative for dizziness, weakness, light-headedness and headaches.  Psychiatric/Behavioral: Positive for suicidal ideas. Negative for homicidal ideas.  All other systems reviewed and are negative.    Physical Exam Updated Vital Signs BP 128/77 (BP Location: Left Arm)   Pulse 78   Temp 98.2 F (36.8 C) (Oral)   Resp 16   Ht  6\' 4"  (1.93 m)   Wt 111.1 kg (245 lb)   SpO2 98%   BMI 29.82 kg/m   Physical Exam  Constitutional: He is oriented to person, place, and time. He appears well-developed and well-nourished. No distress.  HENT:  Head: Normocephalic and atraumatic.  Nose: Nose normal.  Mouth/Throat: Oropharynx is clear and moist. No oropharyngeal exudate.  Eyes: Conjunctivae are normal.  Neck: Neck supple.    Cardiovascular: Normal rate and regular rhythm.   No murmur heard. Pulmonary/Chest: Effort normal and breath sounds normal. No respiratory distress. He has no wheezes. He exhibits no tenderness.  Abdominal: Soft. There is no tenderness.  Musculoskeletal: He exhibits no edema or tenderness.  Neurological: He is alert and oriented to person, place, and time. No sensory deficit. He exhibits normal muscle tone.  Skin: Skin is warm and dry. Capillary refill takes less than 2 seconds. Abrasion noted. He is not diaphoretic. No erythema. No pallor.     Psychiatric: He has a normal mood and affect.  Nursing note and vitals reviewed.    ED Treatments / Results  Labs (all labs ordered are listed, but only abnormal results are displayed) Labs Reviewed  COMPREHENSIVE METABOLIC PANEL - Abnormal; Notable for the following:       Result Value   Glucose, Bld 111 (*)    ALT 15 (*)    All other components within normal limits  ACETAMINOPHEN LEVEL - Abnormal; Notable for the following:    Acetaminophen (Tylenol), Serum <10 (*)    All other components within normal limits  RAPID URINE DRUG SCREEN, HOSP PERFORMED - Abnormal; Notable for the following:    Tetrahydrocannabinol POSITIVE (*)    All other components within normal limits  ETHANOL  SALICYLATE LEVEL  CBC    EKG  EKG Interpretation None       Radiology No results found.  Procedures Procedures (including critical care time)  Medications Ordered in ED Medications  Tdap (BOOSTRIX) injection 0.5 mL (0.5 mLs Intramuscular Given 06/20/17 2251)     Initial Impression / Assessment and Plan / ED Course  I have reviewed the triage vital signs and the nursing notes.  Pertinent labs & imaging results that were available during my care of the patient were reviewed by me and considered in my medical decision making (see chart for details).     Gerald Lee is a 21 y.o. male with a past medical history significant for migraines and  HIV who presents for depression and self-injurious behavior.  Patient reports that he was diagnosed with HIV this year and has been having worsening depression.  He reports that he had seen a therapist years ago for anxiety and depression but was unable to continue follow-up.  He reports that he has had a history of cutting and self-harm.  He says that over the last 2 weeks he has had worsening episodes.  He denies any active suicidal ideation but has had thoughts of hurting himself.  He has cut on his bilateral thighs last week.  He said that he was having worsening depression and inability to sleep for the last few days.  He is concerned that he will progress to full on suicidal ideation and weeks.  He says that he has chronic headaches and intermittent episodes of lightheadedness.  He reports he had a previous workup that was reassuring after his diagnosis of HIV including a lumbar puncture that showed no evidence of abnormalities.  Patient says that he has no physical complaints  at this time.    He denies fevers, chills, chest pain, shortness of breath, nausea, vomiting, vision changes, constipation, diarrhea, dysuria.    On exam, patient has superficial abrasions on bilateral thighs.  Patient's unsure of his last tetanus vaccination.  Tetanus will be updated.  Patient had normal sensation and strength in all extremities.  Normal coordination and gait.  Normal pupils.  Lungs clear and chest nontender.  Abdomen nontender.  Physical exam otherwise unremarkable aside from the abrasions.  Screening laboratory testing will be performed and TTS will be called for management recommendations.  Anticipate following up on TTS recommendations for the worsening depression, intermittent suicidal ideation, and the self-harm.  Patient is here voluntarily and wants help so it does not progress to killing himself.     TTS evaluated the patient and felt he was appropriate for inpatient management.  TTS called and says  the patient will be admitted to [facility] hospital across the street.  Paperwork will be filled out and patient will be admitted for further psychiatric management.    Final Clinical Impressions(s) / ED Diagnoses   Final diagnoses:  Depression, unspecified depression type  Suicidal ideation    Clinical Impression: 1. Depression, unspecified depression type   2. Suicidal ideation     Disposition: Admit to [facility] under Cobos, MD     Tegeler, Canary Brim, MD 06/21/17 [phone]

## 2017-06-20 NOTE — ED Triage Notes (Signed)
Patient reports that he recently got diagnosed with HIV and going through a break up and dealing with a lot of stress right now. Reports that he has intermittent thoughts of SI without a plan but would like to see a psychiatrist to help with his mental state.

## 2017-06-20 NOTE — ED Notes (Signed)
TTS assessment in progress. 

## 2017-06-20 NOTE — ED Notes (Signed)
Jewelry from facial piercing's removed except for bilat eyebrows d/t unable to remove.

## 2017-06-20 NOTE — ED Notes (Signed)
[facility] & [facility].
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
## 2017-06-20 NOTE — ED Notes (Signed)
TTS assessment continues. 

## 2017-06-21 ENCOUNTER — Inpatient Hospital Stay (HOSPITAL_COMMUNITY)
Admission: EM | Admit: 2017-06-21 | Discharge: 2017-06-27 | DRG: 885 | Disposition: A | Discharge status: Home / Self Care | Payer: Medicaid Other | Source: Intra-hospital | Attending: Psychiatry | Admitting: Psychiatry

## 2017-06-21 ENCOUNTER — Encounter (HOSPITAL_COMMUNITY): Payer: Self-pay | Admitting: *Deleted

## 2017-06-21 DIAGNOSIS — R45851 Suicidal ideations: Secondary | ICD-10-CM | POA: Diagnosis present

## 2017-06-21 DIAGNOSIS — R42 Dizziness and giddiness: Secondary | ICD-10-CM | POA: Diagnosis not present

## 2017-06-21 DIAGNOSIS — F17210 Nicotine dependence, cigarettes, uncomplicated: Secondary | ICD-10-CM | POA: Diagnosis not present

## 2017-06-21 DIAGNOSIS — Z23 Encounter for immunization: Secondary | ICD-10-CM

## 2017-06-21 DIAGNOSIS — G4700 Insomnia, unspecified: Secondary | ICD-10-CM | POA: Diagnosis not present

## 2017-06-21 DIAGNOSIS — F419 Anxiety disorder, unspecified: Secondary | ICD-10-CM | POA: Diagnosis not present

## 2017-06-21 DIAGNOSIS — F331 Major depressive disorder, recurrent, moderate: Secondary | ICD-10-CM | POA: Diagnosis present

## 2017-06-21 DIAGNOSIS — Z818 Family history of other mental and behavioral disorders: Secondary | ICD-10-CM | POA: Diagnosis not present

## 2017-06-21 DIAGNOSIS — A749 Chlamydial infection, unspecified: Secondary | ICD-10-CM | POA: Diagnosis not present

## 2017-06-21 DIAGNOSIS — B20 Human immunodeficiency virus [HIV] disease: Secondary | ICD-10-CM | POA: Diagnosis not present

## 2017-06-21 DIAGNOSIS — Z21 Asymptomatic human immunodeficiency virus [HIV] infection status: Secondary | ICD-10-CM | POA: Diagnosis present

## 2017-06-21 DIAGNOSIS — F332 Major depressive disorder, recurrent severe without psychotic features: Secondary | ICD-10-CM | POA: Diagnosis not present

## 2017-06-21 DIAGNOSIS — Z915 Personal history of self-harm: Secondary | ICD-10-CM | POA: Diagnosis not present

## 2017-06-21 DIAGNOSIS — F39 Unspecified mood [affective] disorder: Secondary | ICD-10-CM | POA: Diagnosis not present

## 2017-06-21 DIAGNOSIS — R450 Nervousness: Secondary | ICD-10-CM | POA: Diagnosis not present

## 2017-06-21 DIAGNOSIS — Z79899 Other long term (current) drug therapy: Secondary | ICD-10-CM | POA: Diagnosis not present

## 2017-06-21 DIAGNOSIS — F1210 Cannabis abuse, uncomplicated: Secondary | ICD-10-CM | POA: Diagnosis not present

## 2017-06-21 DIAGNOSIS — A539 Syphilis, unspecified: Secondary | ICD-10-CM | POA: Diagnosis not present

## 2017-06-21 MED ORDER — MAGNESIUM HYDROXIDE 400 MG/5ML PO SUSP: 30.0000 mL | Freq: Every day | ORAL | Status: DC | PRN

## 2017-06-21 MED ORDER — ALUM & MAG HYDROXIDE-SIMETH 200-200-20 MG/5ML PO SUSP: 30.0000 mL | ORAL | Status: DC | PRN

## 2017-06-21 MED ORDER — VENLAFAXINE HCL ER 37.5 MG PO CP24
37.5000 mg | ORAL_CAPSULE | Freq: Every day | ORAL | Status: DC
  Administered 2017-06-22 – 2017-06-24 (×3): 37.5 mg via ORAL
  Filled 2017-06-21 (×4): qty 1

## 2017-06-21 MED ORDER — BICTEGRAVIR-EMTRICITAB-TENOFOV 50-200-25 MG PO TABS
1.0000 | ORAL_TABLET | Freq: Every day | ORAL | Status: DC
  Administered 2017-06-21 – 2017-06-27 (×7): 1 via ORAL
  Filled 2017-06-21 (×9): qty 1

## 2017-06-21 MED ORDER — ACETAMINOPHEN 325 MG PO TABS: 650.0000 mg | ORAL_TABLET | Freq: Four times a day (QID) | ORAL | Status: DC | PRN

## 2017-06-21 MED ORDER — TRAZODONE HCL 50 MG PO TABS
50.0000 mg | ORAL_TABLET | Freq: Every evening | ORAL | Status: DC | PRN
  Administered 2017-06-21 (×2): 50 mg via ORAL
  Filled 2017-06-21 (×8): qty 1

## 2017-06-21 MED ORDER — PNEUMOCOCCAL VAC POLYVALENT 25 MCG/0.5ML IJ INJ
0.5000 mL | INJECTION | INTRAMUSCULAR | Status: AC
  Administered 2017-06-22: 0.5 mL via INTRAMUSCULAR

## 2017-06-21 MED ORDER — IBUPROFEN 800 MG PO TABS: 800.0000 mg | ORAL_TABLET | Freq: Three times a day (TID) | ORAL | Status: DC | PRN

## 2017-06-21 MED ORDER — HYDROXYZINE HCL 25 MG PO TABS
25.0000 mg | ORAL_TABLET | Freq: Three times a day (TID) | ORAL | Status: DC | PRN
  Administered 2017-06-21 – 2017-06-26 (×4): 25 mg via ORAL
  Filled 2017-06-21 (×5): qty 1
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
## 2017-06-21 NOTE — Progress Notes (Signed)
D: Pt was in his room with visitors upon initial approach.  Pt presents with appropriate affect and mood.  He reports he had a good visit with his mother and sister.  His goal is to "like myself a little bit more" and he met his goal.  Pt denies SI/HI, denies hallucinations, denies pain.  Pt has been visible in milieu interacting with peers and staff appropriately.  Pt attended evening group.    A: Introduced self to pt.  Actively listened to pt and offered support and encouragement. Medication administered per order.  PRN medication administered for anxiety.  Q15 minute safety checks maintained.  R: Pt is safe on the unit.  Pt is compliant with medications.  Pt verbally contracts for safety.  Will continue to monitor and assess.

## 2017-06-21 NOTE — Progress Notes (Signed)
DAR NOTE: Patient presents with anxious affect and depressed mood.  Pt has been in the dayroom with peers playing cards. Reports good sleep, fair appetite, normal energy, and good concentration. Denies pain, auditory and visual hallucinations.  Rates depression at 2, hopelessness at 2, and anxiety at 0.  Maintained on routine safety checks.  Medications given as prescribed.  Support and encouragement offered as needed.  Attended group and participated.  States goal for today is " feeling ok."  Patient observed socializing with peers in the dayroom.  Offered no complaint.

## 2017-06-21 NOTE — Progress Notes (Signed)
Recreation Therapy Notes  Date: 06/21/17 Time: 0930 Location: 300 Hall Dayroom  Group Topic: Stress Management  Goal Area(s) Addresses:  Patient will verbalize importance of using healthy stress management.  Patient will identify positive emotions associated with healthy stress management.   Intervention: Stress Management  Activity :  Guided Imagery.  LRT introduced the stress management technique of guided imagery.  Patients were to follow along as LRT read script to engage in the activity.  Education:  Stress Management, Discharge Planning.   Education Outcome: Acknowledges edcuation/In group clarification offered/Needs additional education  Clinical Observations/Feedback:  Pt did not attend group.    Caroll RancherMarjette Norah Fick, LRT/CTRS         [name], Zyona Pettaway A 06/21/2017 11:45 AM

## 2017-06-21 NOTE — [facility] Suicide Risk Assessment (Signed)
[facility] INPATIENT:  Family/Significant Other Suicide Prevention Education  Suicide Prevention Education:  Education Completed; Cletus GashMary Aman, Pt's mother [phone], has been identified by the patient as the family member/significant other with whom the patient will be residing, and identified as the person(s) who will aid the patient in the event of a mental health crisis (suicidal ideations/suicide attempt).  With written consent from the patient, the family member/significant other has been provided the following suicide prevention education, prior to the and/or following the discharge of the patient.  The suicide prevention education provided includes the following:  Suicide risk factors  Suicide prevention and interventions  National Suicide Hotline telephone number  [facility] assessment telephone number  [facility] Assistance 911  [facility] and/or Residential Mobile Crisis Unit telephone number  Request made of family/significant other to:  Remove weapons (e.g., guns, rifles, knives), all items previously/currently identified as safety concern.    Remove drugs/medications (over-the-counter, prescriptions, illicit drugs), all items previously/currently identified as a safety concern.  The family member/significant other verbalizes understanding of the suicide prevention education information provided.  The family member/significant other agrees to remove the items of safety concern listed above.  Pt's mother is concerned about Pt's frequent headaches. She is hopeful that he makes progress while in the hospital. She reports that she will be supportive when Pt is discharged.   Verdene LennertLauren C Trevino Wyatt 06/21/2017, 2:46 PM

## 2017-06-21 NOTE — Tx Team (Signed)
Initial Treatment Plan 06/21/2017 2:23 AM Gerald Herterhomas A Platte [id number]    PATIENT STRESSORS: Financial difficulties Health problems Marital or family conflict   PATIENT STRENGTHS: Ability for insight Active sense of humor Average or above average intelligence Capable of independent living Communication skills Physical Health Supportive family/friends   PATIENT IDENTIFIED PROBLEMS: At risk for suicide  Ineffective coping  "Like myself better"  "Stop relying on other people for my well-being"               DISCHARGE CRITERIA:  Ability to meet basic life and health needs Improved stabilization in mood, thinking, and/or behavior Motivation to continue treatment in a less acute level of care Need for constant or close observation no longer present Withdrawal symptoms are absent or subacute and managed without 24-hour nursing intervention  PRELIMINARY DISCHARGE PLAN: Attend 12-step recovery group Placement in alternative living arrangements  PATIENT/FAMILY INVOLVEMENT: This treatment plan has been presented to and reviewed with the patient, Gerald Lee.  The patient and family have been given the opportunity to ask questions and make suggestions.  Carleene OverlieMiddleton, Mardi Cannady P, RN 06/21/2017, 2:23 AM

## 2017-06-21 NOTE — [facility] Suicide Risk Assessment (Signed)
[facility]   Nursing information obtained from:  Patient Demographic factors:  Male, Caucasian, Cardell PeachGay, lesbian, or bisexual orientation Current Mental Status:  Self-harm thoughts Loss Factors:  Loss of significant relationship, Decline in physical health, Financial problems / change in socioeconomic status Historical Factors:  Prior suicide attempts, Family history of mental illness or substance abuse Risk Reduction Factors:  Employed, Living with another person, especially a relative, Positive social support  Total Time spent with patient: 45 minutes Principal Problem:  MDD, No Psychotic Features. Versus Depression secondary to Medical Illness. Cannabis Dependence Diagnosis:   Patient Active Problem List   Diagnosis Date Noted  . MDD (major depressive disorder), recurrent episode, severe (HCC) [F33.2] 06/21/2017  . HIV (human immunodeficiency virus infection) (HCC) [B20] 05/22/2017  . Macular rash [R21] 05/22/2017  . Healthcare maintenance [Z00.00] 05/22/2017  . Migraine without aura, without mention of intractable migraine without mention of status migrainosus [G43.009] 03/14/2014  . Episodic tension type headache [G44.219] 03/14/2014  . Obesity, unspecified [E66.9] 03/14/2014  . Insomnia, unspecified [G47.00] 03/14/2014    Continued Clinical Symptoms:  Alcohol Use Disorder Identification Test Final Score (AUDIT): 2 The "Alcohol Use Disorders Identification Test", Guidelines for Use in Primary Care, Second Edition.  World Science writerHealth Organization [facility](WHO). Score between 0-7:  no or low risk or alcohol related problems. Score between 8-15:  moderate risk of alcohol related problems. Score between 16-19:  high risk of alcohol related problems. Score 20 or above:  warrants further diagnostic evaluation for alcohol dependence and treatment.   CLINICAL FACTORS:  21 year old male , presented due to worsening depression, anxiety, self injurious behaviors and passive SI.  Facing significant stressors, including being diagnosed with HIV and other STDs recently, facing relationship stressors.    Psychiatric Specialty Exam: Physical Exam  ROS  Blood pressure 118/71, pulse 84, temperature 98.6 F (37 C), temperature source Oral, resp. rate 18, height 6\' 4"  (1.93 m), weight 103.9 kg (229 lb).Body mass index is 27.87 kg/m.   see admit note MSE    COGNITIVE FEATURES THAT CONTRIBUTE TO RISK:  Closed-mindedness and Loss of executive function    SUICIDE RISK:   Moderate:  Frequent suicidal ideation with limited intensity, and duration, some specificity in terms of plans, no associated intent, good self-control, limited dysphoria/symptomatology, some risk factors present, and identifiable protective factors, including available and accessible social support.  PLAN OF CARE: Patient will be admitted to inpatient psychiatric unit for stabilization and safety. Will provide and encourage milieu participation. Provide medication management and maked adjustments as needed.  Will follow daily.    I certify that inpatient services furnished can reasonably be expected to improve the patient's condition.   Craige CottaFernando A Cobos, MD 06/21/2017, 2:47 PM
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
## 2017-06-22 MED ORDER — TRAZODONE HCL 100 MG PO TABS
100.0000 mg | ORAL_TABLET | Freq: Every evening | ORAL | Status: DC | PRN
  Administered 2017-06-22 – 2017-06-26 (×5): 100 mg via ORAL
  Filled 2017-06-22 (×5): qty 1

## 2017-06-22 NOTE — Plan of Care (Signed)
Problem: Education: Goal: Mental status will improve Outcome: Progressing Patient's mood appears stable; he is interacting well with his peers.

## 2017-06-22 NOTE — Progress Notes (Signed)
DAR NOTE: Patient presents with bright affect and jovial  mood. Pt stated he is doing well, happy with the medication regiment.  Denies pain, auditory and visual hallucinations.  Rates depression at 4, hopelessness at 3, and anxiety at 4.  Maintained on routine safety checks.  Medications given as prescribed.  Support and encouragement offered as needed.  Attended group and participated. Patient observed socializing with peers in the dayroom.  Offered no complaint.

## 2017-06-22 NOTE — Progress Notes (Signed)
[facility] MD Progress Note  06/22/2017 2:07 PM Gerald Lee  MRN:  [id number] Subjective:  Patient reports he is still feeling depressed, but does feel he has improved partially compared to admission. Denies medication side effects thus far . Denies suicidal ideations today. Reports some anxiety as he is expecting a visit from his father this evening. States his father left when he was a child and has been absent throughout most of his life, but that he is hoping their relationship will improve.   Objective : I have discussed case with treatment team and have met with patient. Patient presents with partial improvement compared to admission- reports some ongoing depression and anxiety but does acknowledge he is feeling partially better than prior to admission. Remains ruminative about significant recent stressors,including recent diagnosis of HIV and diagnosis of other STDs, and recent break up with SO. Affect presents more reactive and improves during session. Denies suicidal ideations today. No disruptive or agitated behaviors on unit, going to groups, visible in day room. Principal Problem:  MDD  Diagnosis:   Patient Active Problem List   Diagnosis Date Noted  . MDD (major depressive disorder), recurrent episode, severe ([place]) [F33.2] 06/21/2017  . HIV (human immunodeficiency virus infection) ([place]) [B20] 05/22/2017  . Macular rash [R21] 05/22/2017  . Healthcare maintenance [Z00.00] 05/22/2017  . Migraine without aura, without mention of intractable migraine without mention of status migrainosus [G43.009] 03/14/2014  . Episodic tension type headache [G44.219] 03/14/2014  . Obesity, unspecified [E66.9] 03/14/2014  . Insomnia, unspecified [G47.00] 03/14/2014   Total Time spent with patient: 20 minutes  Past Medical History:  Past Medical History:  Diagnosis Date  . Asthma   . Headache(784.0)   . HIV infection [facility])     Past Surgical History:  Procedure Laterality Date  . CIRCUMCISION   1998  . HERNIA REPAIR     Done between the ages of 22 or 44 years   . NO PAST SURGERIES     Family History:  Family History  Problem Relation Age of Onset  . Stroke Mother   . Hypertension Mother   . Diabetes Father    Social History:  History  Alcohol Use  . Yes    Comment: occ     History  Drug Use  . Types: Marijuana    Social History   Social History  . Marital status: Single    Spouse name: N/A  . Number of children: N/A  . Years of education: N/A   Social History Main Topics  . Smoking status: Current Every Day Smoker    Types: Cigarettes  . Smokeless tobacco: Never Used  . Alcohol use Yes     Comment: occ  . Drug use: Yes    Types: Marijuana  . Sexual activity: Yes    Partners: Male    Birth control/ protection: Condom   Other Topics Concern  . None   Social History Narrative  . None   Additional Social History:   Sleep: Fair  Appetite:  Good  Current Medications: Current Facility-Administered Medications  Medication Dose Route Frequency Provider Last Rate Last Dose  . acetaminophen (TYLENOL) tablet 650 mg  650 mg Oral Q6H PRN Laverle Hobby, PA-C      . alum & mag hydroxide-simeth (MAALOX/MYLANTA) 200-200-20 MG/5ML suspension 30 mL  30 mL Oral Q4H PRN Laverle Hobby, PA-C      . bictegravir-emtricitabine-tenofovir AF (BIKTARVY) 50-200-25 MG per tablet 1 tablet  1 tablet Oral Daily Patriciaann Clan  E, PA-C   1 tablet at 06/22/17 0803  . hydrOXYzine (ATARAX/VISTARIL) tablet 25 mg  25 mg Oral TID PRN Patriciaann Clan E, PA-C   25 mg at 06/21/17 2101  . ibuprofen (ADVIL,MOTRIN) tablet 800 mg  800 mg Oral TID PRN Laverle Hobby, PA-C      . magnesium hydroxide (MILK OF MAGNESIA) suspension 30 mL  30 mL Oral Daily PRN Laverle Hobby, PA-C      . traZODone (DESYREL) tablet 100 mg  100 mg Oral QHS PRN Cobos, Myer Peer, MD      . venlafaxine XR (EFFEXOR-XR) 24 hr capsule 37.5 mg  37.5 mg Oral Q breakfast Cobos, Myer Peer, MD   37.5 mg at 06/22/17  0803    Lab Results:  Results for orders placed or performed during the hospital encounter of 06/20/17 (from the past 48 hour(s))  Comprehensive metabolic panel     Status: Abnormal   Collection Time: 06/20/17  5:57 PM  Result Value Ref Range   Sodium 139 135 - 145 mmol/L   Potassium 3.7 3.5 - 5.1 mmol/L   Chloride 106 101 - 111 mmol/L   CO2 25 22 - 32 mmol/L   Glucose, Bld 111 (H) 65 - 99 mg/dL   BUN 10 6 - 20 mg/dL   Creatinine, Ser 1.17 0.61 - 1.24 mg/dL   Calcium 9.4 8.9 - 10.3 mg/dL   Total Protein 7.6 6.5 - 8.1 g/dL   Albumin 4.3 3.5 - 5.0 g/dL   AST 19 15 - 41 U/L   ALT 15 (L) 17 - 63 U/L   Alkaline Phosphatase 89 38 - 126 U/L   Total Bilirubin 1.2 0.3 - 1.2 mg/dL   GFR calc non Af Amer >60 >60 mL/min   GFR calc Af Amer >60 >60 mL/min    Comment: (NOTE) The eGFR has been calculated using the CKD EPI equation. This calculation has not been validated in all clinical situations. eGFR's persistently <60 mL/min signify possible Chronic Kidney Disease.    Anion gap 8 5 - 15  Ethanol     Status: None   Collection Time: 06/20/17  5:57 PM  Result Value Ref Range   Alcohol, Ethyl (B) <10 <10 mg/dL    Comment:        LOWEST DETECTABLE LIMIT FOR SERUM ALCOHOL IS 10 mg/dL FOR MEDICAL PURPOSES ONLY   Salicylate level     Status: None   Collection Time: 06/20/17  5:57 PM  Result Value Ref Range   Salicylate Lvl <6.0 2.8 - 30.0 mg/dL  Acetaminophen level     Status: Abnormal   Collection Time: 06/20/17  5:57 PM  Result Value Ref Range   Acetaminophen (Tylenol), Serum <10 (L) 10 - 30 ug/mL    Comment:        THERAPEUTIC CONCENTRATIONS VARY SIGNIFICANTLY. A RANGE OF 10-30 ug/mL MAY BE AN EFFECTIVE CONCENTRATION FOR MANY PATIENTS. HOWEVER, SOME ARE BEST TREATED AT CONCENTRATIONS OUTSIDE THIS RANGE. ACETAMINOPHEN CONCENTRATIONS >150 ug/mL AT 4 HOURS AFTER INGESTION AND >50 ug/mL AT 12 HOURS AFTER INGESTION ARE OFTEN ASSOCIATED WITH TOXIC REACTIONS.   cbc     Status:  None   Collection Time: 06/20/17  5:57 PM  Result Value Ref Range   WBC 6.6 4.0 - 10.5 K/uL   RBC 5.05 4.22 - 5.81 MIL/uL   Hemoglobin 13.9 13.0 - 17.0 g/dL   HCT 41.6 39.0 - 52.0 %   MCV 82.4 78.0 - 100.0 fL   MCH 27.5  26.0 - 34.0 pg   MCHC 33.4 30.0 - 36.0 g/dL   RDW 14.4 11.5 - 15.5 %   Platelets 194 150 - 400 K/uL  Rapid urine drug screen (hospital performed)     Status: Abnormal   Collection Time: 06/20/17  8:46 PM  Result Value Ref Range   Opiates NONE DETECTED NONE DETECTED   Cocaine NONE DETECTED NONE DETECTED   Benzodiazepines NONE DETECTED NONE DETECTED   Amphetamines NONE DETECTED NONE DETECTED   Tetrahydrocannabinol POSITIVE (A) NONE DETECTED   Barbiturates NONE DETECTED NONE DETECTED    Comment:        DRUG SCREEN FOR MEDICAL PURPOSES ONLY.  IF CONFIRMATION IS NEEDED FOR ANY PURPOSE, NOTIFY LAB WITHIN 5 DAYS.        LOWEST DETECTABLE LIMITS FOR URINE DRUG SCREEN Drug Class       Cutoff (ng/mL) Amphetamine      1000 Barbiturate      200 Benzodiazepine   729 Tricyclics       021 Opiates          300 Cocaine          300 THC              50     Blood Alcohol level:  Lab Results  Component Value Date   ETH <10 11/55/2080    Metabolic Disorder Labs: No results found for: HGBA1C, MPG No results found for: PROLACTIN Lab Results  Component Value Date   CHOL 122 05/22/2017   TRIG 86 05/22/2017   HDL 21 (L) 05/22/2017   CHOLHDL 5.8 (H) 05/22/2017    Physical Findings: AIMS: Facial and Oral Movements Muscles of Facial Expression: None, normal Lips and Perioral Area: None, normal Jaw: None, normal Tongue: None, normal,Extremity Movements Upper (arms, wrists, hands, fingers): None, normal Lower (legs, knees, ankles, toes): None, normal, Trunk Movements Neck, shoulders, hips: None, normal, Overall Severity Severity of abnormal movements (highest score from questions above): None, normal Incapacitation due to abnormal movements: None, normal Patient's  awareness of abnormal movements (rate only patient's report): No Awareness, Dental Status Current problems with teeth and/or dentures?: No Does patient usually wear dentures?: No  CIWA:    COWS:     Musculoskeletal: Strength & Muscle Tone: within normal limits Gait & Station: normal Patient leans: N/A  Psychiatric Specialty Exam: Physical Exam  ROS denies chest pain, no shortness of breath, no vomiting , no rash   Blood pressure (!) 102/47, pulse 76, temperature 97.7 F (36.5 C), temperature source Oral, resp. rate 18, height _0  (1.93 m), weight 103.9 kg (229 lb).Body mass index is 27.87 kg/m.  General Appearance: Well Groomed  Eye Contact:  Good  Speech:  Normal Rate  Volume:  Normal  Mood:  partially improved , less depressed   Affect:  remains vaguely constricted, anxious, but improved overall   Thought Process:  Linear and Descriptions of Associations: Intact  Orientation:  Full (Time, Place, and Person)  Thought Content:  denies hallucinations, no delusions, not internally preoccupied, remains ruminative about stressors  Suicidal Thoughts:  No- today denies suicidal or self injurious ideations, denies homicidal or violent ideations, contracts for safety on unit  Homicidal Thoughts:  No  Memory:  recent and remote grossly intact   Judgement:  Other:  improving   Insight:  improving   Psychomotor Activity:  Normal  Concentration:  Concentration: Good and Attention Span: Good  Recall:  Good  Fund of Knowledge:  Good  Language:  Good  Akathisia:  Negative  Handed:  Right  AIMS (if indicated):     Assets:  Communication Skills Desire for Improvement Resilience  ADL's:  Intact  Cognition:  WNL  Sleep:  Number of Hours: 6.75   Assessment - patient presents with partially improved mood and affect, but does remain depressed, ruminative about recent losses and stressors. Today expresses anxiety about expected visit from father, with whom he has had a distant relationship.  Denies suicidal ideations, denies psychotic symptoms, and is thus far tolerating Effexor XR . States he only slept fairly on Trazodone 50 mgr QHS so is hoping to increase dose tonight.  Treatment Plan Summary: Daily contact with patient to assess and evaluate symptoms and progress in treatment, Medication management, Plan inpatient treatment  and medications as below Encourage group and milieu participation to work on coping skills and symptom reduction Treatment team working on disposition planning options Continue Effexor XR 37.5 mgrs QHS PRN for depression, anxiety, titrate gradually as tolerated  Continue Vistaril 25 mgrs Q 8 hours PRN for anxiety Increase  Trazodone to  100 mgrs QHS PRN for insomnia Continue Biktarvy for HIV management  Jenne Campus, MD 06/22/2017, 2:07 PM

## 2017-06-22 NOTE — Plan of Care (Signed)
Problem: Activity: Goal: Sleeping patterns will improve Outcome: Progressing Slept 6.75 hours last night according to flowsheet.    

## 2017-06-22 NOTE — B[facility] Group Notes (Signed)
LCSW Group Therapy Note  06/22/2017 1:15pm  Type of Therapy/Topic:  Group Therapy:  Balance in Life  Participation Level:  Active  Description of Group:    This group will address the concept of balance and how it feels and looks when one is unbalanced. Patients will be encouraged to process areas in their lives that are out of balance and identify reasons for remaining unbalanced. Facilitators will guide patients in utilizing problem-solving interventions to address and correct the stressor making their life unbalanced. Understanding and applying boundaries will be explored and addressed for obtaining and maintaining a balanced life. Patients will be encouraged to explore ways to assertively make their unbalanced needs known to significant others in their lives, using other group members and facilitator for support and feedback.  Therapeutic Goals: 1. Patient will identify two or more emotions or situations they have that consume much of in their lives. 2. Patient will identify signs/triggers that life has become out of balance:  3. Patient will identify two ways to set boundaries in order to achieve balance in their lives:  4. Patient will demonstrate ability to communicate their needs through discussion and/or role plays  Summary of Patient Progress: Pt was present for the duration of the group. Pt has exhibited good insight and was able to pinpoint specific examples of how his life is unbalanced. Pt states that he is often to concerned about other people's feelings and emotions to take care of his own feelings and emotions. Pt does not reach out to his support system because he does not want to be seen as a burden to them.   Therapeutic Modalities:   Cognitive Behavioral Therapy Solution-Focused Therapy Assertiveness Training  Jonathon JordanLynn B Glennis Montenegro, MSW, LCSWA 06/22/2017 4:19 PM '

## 2017-06-22 NOTE — Social Work (Signed)
Referred to [facility], is [place] resident.  Nevah Dalal, LCSW Lead Clinical Social Worker Phone:  [phone]  

## 2017-06-22 NOTE — Progress Notes (Signed)
D: Pt was in his room with visitors upon initial approach.  Pt presents with appropriate affect and mood.  Describes his day as "pretty good."  He reports his father, sister, and mother visited and it was "surprisingly good."  Goal is to "like myself a little bit more."  Pt denies SI/HI, denies hallucinations, denies pain.  Pt has been visible in milieu interacting with peers and staff appropriately.  Pt attended evening group.    A: Introduced self to pt.  Actively listened to pt and offered support and encouragement.  PRN medication administered for sleep.  Q15 minute safety checks maintained.  R: Pt is safe on the unit.  Pt is compliant with medication.  Pt verbally contracts for safety.  Will continue to monitor and assess.

## 2017-06-23 DIAGNOSIS — F39 Unspecified mood [affective] disorder: Secondary | ICD-10-CM

## 2017-06-23 DIAGNOSIS — F17210 Nicotine dependence, cigarettes, uncomplicated: Secondary | ICD-10-CM

## 2017-06-23 DIAGNOSIS — F1210 Cannabis abuse, uncomplicated: Secondary | ICD-10-CM

## 2017-06-23 DIAGNOSIS — G4700 Insomnia, unspecified: Secondary | ICD-10-CM

## 2017-06-23 DIAGNOSIS — B20 Human immunodeficiency virus [HIV] disease: Secondary | ICD-10-CM

## 2017-06-23 DIAGNOSIS — F419 Anxiety disorder, unspecified: Secondary | ICD-10-CM

## 2017-06-23 NOTE — B[facility] Group Notes (Signed)
LCSW Group Therapy Note  06/23/2017 1:15pm  Type of Therapy and Topic:  Group Therapy:  Feelings around Relapse and Recovery  Participation Level: Active   Description of Group:    Patients in this group will discuss emotions they experience before and after a relapse. They will process how experiencing these feelings, or avoidance of experiencing them, relates to having a relapse. Facilitator will guide patients to explore emotions they have related to recovery. Patients will be encouraged to process which emotions are more powerful. They will be guided to discuss the emotional reaction significant others in their lives may have to their relapse or recovery. Patients will be assisted in exploring ways to respond to the emotions of others without this contributing to a relapse.  Therapeutic Goals: 1. Patient will identify two or more emotions that lead to a relapse for them 2. Patient will identify two emotions that result when they relapse 3. Patient will identify two emotions related to recovery 4. Patient will demonstrate ability to communicate their needs through discussion and/or role plays  Therapeutic Modalities:   Cognitive Behavioral Therapy Solution-Focused Therapy Assertiveness Training Relapse Prevention Therapy   Gerald Lee, MSW, LCSWA 06/23/2017 3:54 PM   

## 2017-06-23 NOTE — Progress Notes (Signed)
DAR NOTE: Pt present with bright affect and jovial  mood in the unit. Pt has been observed in the milieu interacting with peers.  Pt denies physical pain, took all his meds as scheduled. As per self inventory, pt had a good night sleep, good appetite, normal energy, and good concentration. Pt rate depression at 0, hopeless ness at 0, and anxiety 0. Pt's safety ensured with 15 minute and environmental checks. Pt currently denies SI/HI and A/V hallucinations. Pt verbally agrees to seek staff if SI/HI or A/VH occurs and to consult with staff before acting on these thoughts. Will continue POC.

## 2017-06-23 NOTE — Progress Notes (Signed)
Recreation Therapy Notes  Date: 06/23/17 Time: 0930 Location: [address] Dayroom  Group Topic: Stress Management  Goal Area(s) Addresses:  Patient will verbalize importance of using healthy stress management.  Patient will identify positive emotions associated with healthy stress management.   Behavioral Response: Engaged  Intervention: Stress Management  Activity :  LRT introduced the stress management technique of meditation.  LRT played a meditation from the Calm app that allowed patients to examine the process of humanity and how we interact with each other.  Patients were to follow along as the meditation played to fully engage in the technique.  Education:  Stress Management, Discharge Planning.   Education Outcome: Acknowledges edcuation/In group clarification offered/Needs additional education  Clinical Observations/Feedback: Pt attended group.   Caroll RancherMarjette Shamieka Gullo, LRT/CTRS         Caroll RancherLindsay, Jacquees Gongora A 06/23/2017 12:53 PM

## 2017-06-23 NOTE — Tx Team (Signed)
Interdisciplinary Treatment and Diagnostic Plan Update 06/23/2017 Time of Session: 9:30am  Gerald Lee  MRN: [id number]  Principal Diagnosis: MDD (major depressive disorder), recurrent episode, severe ([facility])  Secondary Diagnoses: Principal Problem:   MDD (major depressive disorder), recurrent episode, severe ([facility]) Active Problems:   HIV (human immunodeficiency virus infection) ([facility])   Current Medications:  Current Facility-Administered Medications  Medication Dose Route Frequency Provider Last Rate Last Dose  . acetaminophen (TYLENOL) tablet 650 mg  650 mg Oral Q6H PRN Kerry Hough, PA-C      . alum & mag hydroxide-simeth (MAALOX/MYLANTA) 200-200-20 MG/5ML suspension 30 mL  30 mL Oral Q4H PRN Kerry Hough, PA-C      . bictegravir-emtricitabine-tenofovir AF (BIKTARVY) 50-200-25 MG per tablet 1 tablet  1 tablet Oral Daily Kerry Hough, PA-C   1 tablet at 06/23/17 0758  . hydrOXYzine (ATARAX/VISTARIL) tablet 25 mg  25 mg Oral TID PRN Donell Sievert E, PA-C   25 mg at 06/21/17 2101  . ibuprofen (ADVIL,MOTRIN) tablet 800 mg  800 mg Oral TID PRN Kerry Hough, PA-C      . magnesium hydroxide (MILK OF MAGNESIA) suspension 30 mL  30 mL Oral Daily PRN Kerry Hough, PA-C      . traZODone (DESYREL) tablet 100 mg  100 mg Oral QHS PRN Cobos, Rockey Situ, MD   100 mg at 06/22/17 2225  . venlafaxine XR (EFFEXOR-XR) 24 hr capsule 37.5 mg  37.5 mg Oral Q breakfast Cobos, Rockey Situ, MD   37.5 mg at 06/23/17 7829    PTA Medications: Prescriptions Prior to Admission  Medication Sig Dispense Refill Last Dose  . bictegravir-emtricitabine-tenofovir AF (BIKTARVY) 50-200-25 MG TABS tablet Take 1 tablet by mouth daily. 30 tablet 5 06/20/2017 at Unknown time  . ibuprofen (ADVIL,MOTRIN) 800 MG tablet Take 800 mg by mouth every 8 (eight) hours as needed.   06/12/2017 at Unknown time    Treatment Modalities: Medication Management, Group therapy, Case management,  1 to 1 session with  clinician, Psychoeducation, Recreational therapy.  Patient Stressors: Financial difficulties Health problems Marital or family conflict Patient Strengths: Ability for insight Active sense of humor Average or above average intelligence Capable of independent living Communication skills Physical Health Supportive family/friends  Physician Treatment Plan for Primary Diagnosis: MDD (major depressive disorder), recurrent episode, severe ([facility]) Long Term Goal(s): Improvement in symptoms so as ready for discharge Short Term Goals: Ability to identify changes in lifestyle to reduce recurrence of condition will improve Compliance with prescribed medications will improve Ability to identify triggers associated with substance abuse/mental health issues will improve  Medication Management: Evaluate patient's response, side effects, and tolerance of medication regimen.  Therapeutic Interventions: 1 to 1 sessions, Unit Group sessions and Medication administration.  Evaluation of Outcomes: Progressing  Physician Treatment Plan for Secondary Diagnosis: Principal Problem:   MDD (major depressive disorder), recurrent episode, severe ([facility]) Active Problems:   HIV (human immunodeficiency virus infection) ([facility])  Long Term Goal(s): Improvement in symptoms so as ready for discharge  Short Term Goals: Ability to identify changes in lifestyle to reduce recurrence of condition will improve Compliance with prescribed medications will improve Ability to identify triggers associated with substance abuse/mental health issues will improve  Medication Management: Evaluate patient's response, side effects, and tolerance of medication regimen.  Therapeutic Interventions: 1 to 1 sessions, Unit Group sessions and Medication administration.  Evaluation of Outcomes: Progressing  RN Treatment Plan for Primary Diagnosis: MDD (major depressive disorder), recurrent episode, severe ([facility]) Long Term  Goal(s): Knowledge  of disease and therapeutic regimen to maintain health will improve  Short Term Goals: Ability to disclose and discuss suicidal ideas and Compliance with prescribed medications will improve  Medication Management: RN will administer medications as ordered by provider, will assess and evaluate patient's response and provide education to patient for prescribed medication. RN will report any adverse and/or side effects to prescribing provider.  Therapeutic Interventions: 1 on 1 counseling sessions, Psychoeducation, Medication administration, Evaluate responses to treatment, Monitor vital signs and CBGs as ordered, Perform/monitor CIWA, COWS, AIMS and Fall Risk screenings as ordered, Perform wound care treatments as ordered.  Evaluation of Outcomes: Progressing  LCSW Treatment Plan for Primary Diagnosis: MDD (major depressive disorder), recurrent episode, severe ([facility]) Long Term Goal(s): Safe transition to appropriate next level of care at discharge, Engage patient in therapeutic group addressing interpersonal concerns. Short Term Goals: Engage patient in aftercare planning with referrals and resources, Increase emotional regulation, Identify triggers associated with mental health/substance abuse issues and Increase skills for wellness and recovery  Therapeutic Interventions: Assess for all discharge needs, 1 to 1 time with Social worker, Explore available resources and support systems, Assess for adequacy in community support network, Educate family and significant other(s) on suicide prevention, Complete Psychosocial Assessment, Interpersonal group therapy.  Evaluation of Outcomes: Progressing  Progress in Treatment: Attending groups: Yes Participating in groups: Yes Taking medication as prescribed: Yes, MD continues to assess for medication changes as needed Toleration medication: Yes, no side effects reported at this time Family/Significant other contact made: Yes, pt's mother contacted.   Patient understands diagnosis: Yes, AEB pt's willingness to participate in treatment. Discussing patient identified problems/goals with staff: Yes Medical problems stabilized or resolved: Yes Denies suicidal/homicidal ideation: Yes Issues/concerns per patient self-inventory: None Other: N/A  New problem(s) identified: None identified at this time.   New Short Term/Long Term Goal(s): None identified at this time.   Discharge Plan or Barriers: Pt will return home and follow up outpatient with [facility]uss[facility].  Reason for Continuation of Hospitalization:  Anxiety  Depression Medication stabilization Suicidal ideation  Estimated Length of Stay: 1-3 days; Estimated discharge date 06/26/17  Attendees: Patient: 06/23/2017 10:21 AM  Physician: Dr. Jama Flavorsobos 06/23/2017 10:21 AM  Nursing: Erskine SquibbJane, RN; Ponce de LeonPatty, RN 06/23/2017 10:21 AM  RN Care Manager: 06/23/2017 10:21 AM  Social Worker: Donnelly StagerLynn Delana Manganello, LCSWA 06/23/2017 10:21 AM  Recreational Therapist:  06/23/2017 10:21 AM  Other: Armandina StammerAgnes Nwoko, NP 06/23/2017 10:21 AM  Other:  06/23/2017 10:21 AM  Other: 06/23/2017 10:21 AM  Scribe for Treatment Team: Jonathon JordanLynn B Najat Olazabal, MSW,LCSWA 06/23/2017 10:21 AM

## 2017-06-23 NOTE — Progress Notes (Signed)
[facility] MD Progress Note  06/23/2017 3:00 PM Gerald Lee  MRN:  [id number]   Subjective:  Patient reports that he is feeling good and slept god last night. He denies any SI/HI/AVH and depression. He states that since he became friends with another patient he has improved.  Objective: Patient's chart and findings reviewed and discussed with treatment team. Patient has been pleasant and cooperative. He has been in the day room interacting appropriately with peers and staff. Patient has been attending group and participating. Patient seems to get attached to people quickly.  Principal Problem: MDD (major depressive disorder), recurrent episode, severe (HCC) Diagnosis:   Patient Active Problem List   Diagnosis Date Noted  . MDD (major depressive disorder), recurrent episode, severe (HCC) [F33.2] 06/21/2017  . HIV (human immunodeficiency virus infection) (HCC) [B20] 05/22/2017  . Macular rash [R21] 05/22/2017  . Healthcare maintenance [Z00.00] 05/22/2017  . Migraine without aura, without mention of intractable migraine without mention of status migrainosus [G43.009] 03/14/2014  . Episodic tension type headache [G44.219] 03/14/2014  . Obesity, unspecified [E66.9] 03/14/2014  . Insomnia, unspecified [G47.00] 03/14/2014   Total Time spent with patient: 15 minutes  Past Psychiatric History: See H&P  Past Medical History:  Past Medical History:  Diagnosis Date  . Asthma   . Headache(784.0)   . HIV infection [facility])     Past Surgical History:  Procedure Laterality Date  . CIRCUMCISION  1998  . HERNIA REPAIR     Done between the ages of 75 or 8 years   . NO PAST SURGERIES     Family History:  Family History  Problem Relation Age of Onset  . Stroke Mother   . Hypertension Mother   . Diabetes Father    Family Psychiatric  History: See H&P Social History:  History  Alcohol Use  . Yes    Comment: occ     History  Drug Use  . Types: Marijuana    Social History   Social History   . Marital status: Single    Spouse name: N/A  . Number of children: N/A  . Years of education: N/A   Social History Main Topics  . Smoking status: Current Every Day Smoker    Types: Cigarettes  . Smokeless tobacco: Never Used  . Alcohol use Yes     Comment: occ  . Drug use: Yes    Types: Marijuana  . Sexual activity: Yes    Partners: Male    Birth control/ protection: Condom   Other Topics Concern  . None   Social History Narrative  . None   Additional Social History:                         Sleep: Good  Appetite:  Good  Current Medications: Current Facility-Administered Medications  Medication Dose Route Frequency Provider Last Rate Last Dose  . acetaminophen (TYLENOL) tablet 650 mg  650 mg Oral Q6H PRN Kerry Hough, PA-C      . alum & mag hydroxide-simeth (MAALOX/MYLANTA) 200-200-20 MG/5ML suspension 30 mL  30 mL Oral Q4H PRN Kerry Hough, PA-C      . bictegravir-emtricitabine-tenofovir AF (BIKTARVY) 50-200-25 MG per tablet 1 tablet  1 tablet Oral Daily Kerry Hough, PA-C   1 tablet at 06/23/17 0758  . hydrOXYzine (ATARAX/VISTARIL) tablet 25 mg  25 mg Oral TID PRN Donell Sievert E, PA-C   25 mg at 06/21/17 2101  . ibuprofen (ADVIL,MOTRIN) tablet 800  mg  800 mg Oral TID PRN Kerry Hough, PA-C      . magnesium hydroxide (MILK OF MAGNESIA) suspension 30 mL  30 mL Oral Daily PRN Kerry Hough, PA-C      . traZODone (DESYREL) tablet 100 mg  100 mg Oral QHS PRN Cobos, Rockey Situ, MD   100 mg at 06/22/17 2225  . venlafaxine XR (EFFEXOR-XR) 24 hr capsule 37.5 mg  37.5 mg Oral Q breakfast Cobos, Rockey Situ, MD   37.5 mg at 06/23/17 1610    Lab Results: No results found for this or any previous visit (from the past 48 hour(s)).  Blood Alcohol level:  Lab Results  Component Value Date   ETH <10 06/20/2017    Metabolic Disorder Labs: No results found for: HGBA1C, MPG No results found for: PROLACTIN Lab Results  Component Value Date   CHOL  122 05/22/2017   TRIG 86 05/22/2017   HDL 21 (L) 05/22/2017   CHOLHDL 5.8 (H) 05/22/2017    Physical Findings: AIMS: Facial and Oral Movements Muscles of Facial Expression: None, normal Lips and Perioral Area: None, normal Jaw: None, normal Tongue: None, normal,Extremity Movements Upper (arms, wrists, hands, fingers): None, normal Lower (legs, knees, ankles, toes): None, normal, Trunk Movements Neck, shoulders, hips: None, normal, Overall Severity Severity of abnormal movements (highest score from questions above): None, normal Incapacitation due to abnormal movements: None, normal Patient's awareness of abnormal movements (rate only patient's report): No Awareness, Dental Status Current problems with teeth and/or dentures?: No Does patient usually wear dentures?: No  CIWA:    COWS:     Musculoskeletal: Strength & Muscle Tone: within normal limits Gait & Station: normal Patient leans: N/A  Psychiatric Specialty Exam: Physical Exam  Nursing note and vitals reviewed. Constitutional: He is oriented to person, place, and time. He appears well-developed and well-nourished.  Cardiovascular: Normal rate.   Respiratory: Effort normal.  Musculoskeletal: Normal range of motion.  Neurological: He is alert and oriented to person, place, and time.  Skin: Skin is warm.    Review of Systems  Constitutional: Negative.   HENT: Negative.   Eyes: Negative.   Respiratory: Negative.   Cardiovascular: Negative.   Gastrointestinal: Negative.   Genitourinary: Negative.   Musculoskeletal: Negative.   Skin: Negative.   Neurological: Negative.   Endo/Heme/Allergies: Negative.   Psychiatric/Behavioral: Negative.     Blood pressure (!) 90/55, pulse 97, temperature 98 F (36.7 C), temperature source Oral, resp. rate 18, height 6\' 4"  (1.93 m), weight 103.9 kg (229 lb).Body mass index is 27.87 kg/m.  General Appearance: Casual  Eye Contact:  Good  Speech:  Clear and Coherent and Normal Rate   Volume:  Normal  Mood:  Euthymic  Affect:  Congruent  Thought Process:  Goal Directed and Descriptions of Associations: Intact  Orientation:  Full (Time, Place, and Person)  Thought Content:  WDL  Suicidal Thoughts:  No  Homicidal Thoughts:  No  Memory:  Immediate;   Good Recent;   Good Remote;   Good  Judgement:  Fair  Insight:  Good  Psychomotor Activity:  Normal  Concentration:  Concentration: Good and Attention Span: Good  Recall:  Good  Fund of Knowledge:  Good  Language:  Good  Akathisia:  No  Handed:  Right  AIMS (if indicated):     Assets:  Communication Skills Desire for Improvement Financial Resources/Insurance Housing Physical Health Social Support Transportation  ADL's:  Intact  Cognition:  WNL  Sleep:  Number of Hours:  6.75   Problems Addressed: MDD severe Insomnia HIV  Treatment Plan Summary: Daily contact with patient to assess and evaluate symptoms and progress in treatment, Medication management and Plan is to:  -Continue Effexor-XR 37.5 mg PO Daily for mood stability -Continue Trazodone 100 mg PO QHS PRN for insomnia -Continue Vistaril 25 mg PO TID PRN for anxiety -Continue Biktarvy 50-200-25 mg PO Daily for HIV -Encourage group therapy participation  Gerald Bunnellravis B Money, FNP 06/23/2017, 3:00 PM   Agree with NP Progress Note

## 2017-06-23 NOTE — Progress Notes (Signed)
D.  Pt pleasant on approach, denies complaints at this time.  Pt was positive for evening wrap up group, observed engaged in appropriate interaction with peers on the unit.  Pt denies SI/HI/AVH at this time.  A.  Support and encouragement offered, medication given as ordered  R.  Pt remains safe on the unit, will continue to monitor.  

## 2017-06-23 NOTE — Plan of Care (Signed)
Problem: Safety: Goal: Ability to disclose and discuss suicidal ideas will improve Outcome: Progressing Pt verbalized not having suicidal thoughts and agreed to come to staff when suicidal.

## 2017-06-24 MED ORDER — VENLAFAXINE HCL ER 75 MG PO CP24
75.0000 mg | ORAL_CAPSULE | Freq: Every day | ORAL | Status: DC
  Administered 2017-06-25 – 2017-06-27 (×3): 75 mg via ORAL
  Filled 2017-06-24 (×5): qty 1

## 2017-06-24 NOTE — Progress Notes (Signed)
[facility]  06/24/2017 10:37 AM Gerald Lee Gerald Lee  MRN:  [id number]   Subjective:  Patient reports that he is feeling good and slept good last night. He denies any SI/HI/AVH, but reports minor depression of 3/10 and anxiety 2/10. He is worried about going back to the same environment, but that is his home and he will have to go back there. He feels his mood could be improved more. He reports feeling Gerald little dizzy in the morning when getting out of bed and he is curious if its orthostatic hypotension.  Objective: Patient's chart and findings reviewed and discussed with treatment team. Patient has been pleasant and cooperative. He appears depressed with flat affect when discussing his issues. Will increase his Effexor-XR to 75 mg Daily and will have the tech check for orthostatic hypotension.  Principal Problem: MDD (major depressive disorder), recurrent episode, severe (HCC) Diagnosis:   Patient Active Problem List   Diagnosis Date Noted  . MDD (major depressive disorder), recurrent episode, severe (HCC) [F33.2] 06/21/2017  . HIV (human immunodeficiency virus infection) (HCC) [B20] 05/22/2017  . Macular rash [R21] 05/22/2017  . Healthcare maintenance [Z00.00] 05/22/2017  . Migraine without aura, without mention of intractable migraine without mention of status migrainosus [G43.009] 03/14/2014  . Episodic tension type headache [G44.219] 03/14/2014  . Obesity, unspecified [E66.9] 03/14/2014  . Insomnia, unspecified [G47.00] 03/14/2014   Total Time spent with patient: 25 minutes  Past Psychiatric History: See H&P  Past Medical History:  Past Medical History:  Diagnosis Date  . Asthma   . Headache(784.0)   . HIV infection [facility](HCC)     Past Surgical History:  Procedure Laterality Date  . CIRCUMCISION  1998  . HERNIA REPAIR     Done between the ages of 89 or 8 years   . NO PAST SURGERIES     Family History:  Family History  Problem Relation Age of Onset  . Stroke Mother   .  Hypertension Mother   . Diabetes Father    Family Psychiatric  History: See H&P Social History:  History  Alcohol Use  . Yes    Comment: occ     History  Drug Use  . Types: Marijuana    Social History   Social History  . Marital status: Single    Spouse name: N/Gerald  . Number of children: N/Gerald  . Years of education: N/Gerald   Social History Main Topics  . Smoking status: Current Every Day Smoker    Types: Cigarettes  . Smokeless tobacco: Never Used  . Alcohol use Yes     Comment: occ  . Drug use: Yes    Types: Marijuana  . Sexual activity: Yes    Partners: Male    Birth control/ protection: Condom   Other Topics Concern  . None   Social History Narrative  . None   Additional Social History:                         Sleep: Good  Appetite:  Good  Current Medications: Current Facility-Administered Medications  Medication Dose Route Frequency Provider Last Rate Last Dose  . acetaminophen (TYLENOL) tablet 650 mg  650 mg Oral Q6H PRN Kerry HoughSimon, Spencer E, PA-C      . alum & mag hydroxide-simeth (MAALOX/MYLANTA) 200-200-20 MG/5ML suspension 30 mL  30 mL Oral Q4H PRN Donell SievertSimon, Spencer E, PA-C      . bictegravir-emtricitabine-tenofovir AF (BIKTARVY) 50-200-25 MG per tablet 1 tablet  1 tablet Oral Daily Kerry Hough, PA-C   1 tablet at 06/24/17 1610  . hydrOXYzine (ATARAX/VISTARIL) tablet 25 mg  25 mg Oral TID PRN Kerry Hough, PA-C   25 mg at 06/21/17 2101  . ibuprofen (ADVIL,MOTRIN) tablet 800 mg  800 mg Oral TID PRN Kerry Hough, PA-C      . magnesium hydroxide (MILK OF MAGNESIA) suspension 30 mL  30 mL Oral Daily PRN Kerry Hough, PA-C      . traZODone (DESYREL) tablet 100 mg  100 mg Oral QHS PRN Cobos, Rockey Situ, MD   100 mg at 06/23/17 2231  . [START ON 06/25/2017] venlafaxine XR (EFFEXOR-XR) 24 hr capsule 75 mg  75 mg Oral Q breakfast Stacie Templin, Gerlene Burdock, FNP        Lab Results: No results found for this or any previous visit (from the past 48  hour(s)).  Blood Alcohol level:  Lab Results  Component Value Date   ETH <10 06/20/2017    Metabolic Disorder Labs: No results found for: HGBA1C, MPG No results found for: PROLACTIN Lab Results  Component Value Date   CHOL 122 05/22/2017   TRIG 86 05/22/2017   HDL 21 (L) 05/22/2017   CHOLHDL 5.8 (H) 05/22/2017    Physical Findings: AIMS: Facial and Oral Movements Muscles of Facial Expression: None, normal Lips and Perioral Area: None, normal Jaw: None, normal Tongue: None, normal,Extremity Movements Upper (arms, wrists, hands, fingers): None, normal Lower (legs, knees, ankles, toes): None, normal, Trunk Movements Neck, shoulders, hips: None, normal, Overall Severity Severity of abnormal movements (highest score from questions above): None, normal Incapacitation due to abnormal movements: None, normal Patient's awareness of abnormal movements (rate only patient's report): No Awareness, Dental Status Current problems with teeth and/or dentures?: No Does patient usually wear dentures?: No  CIWA:    COWS:     Musculoskeletal: Strength & Muscle Tone: within normal limits Gait & Station: normal Patient leans: N/Gerald  Psychiatric Specialty Exam: Physical Exam  Nursing note and vitals reviewed. Constitutional: He is oriented to person, place, and time. He appears well-developed and well-nourished.  Cardiovascular: Normal rate.   Respiratory: Effort normal.  Musculoskeletal: Normal range of motion.  Neurological: He is alert and oriented to person, place, and time.  Skin: Skin is warm.    Review of Systems  Constitutional: Negative.   HENT: Negative.   Eyes: Negative.   Respiratory: Negative.   Cardiovascular: Negative.   Gastrointestinal: Negative.   Genitourinary: Negative.   Musculoskeletal: Negative.   Skin: Negative.   Neurological: Negative.   Endo/Heme/Allergies: Negative.   Psychiatric/Behavioral: Positive for depression. The patient is nervous/anxious.      Blood pressure (!) 102/55, pulse 93, temperature (!) 97.4 F (36.3 C), temperature source Oral, resp. rate 18, height 6\' 4"  (1.93 m), weight 103.9 kg (229 lb).Body mass index is 27.87 kg/m.  General Appearance: Casual  Eye Contact:  Good  Speech:  Clear and Coherent and Normal Rate  Volume:  Normal  Mood:  Depressed  Affect:  Flat  Thought Process:  Goal Directed and Descriptions of Associations: Intact  Orientation:  Full (Time, Place, and Person)  Thought Content:  WDL  Suicidal Thoughts:  No  Homicidal Thoughts:  No  Memory:  Immediate;   Good Recent;   Good Remote;   Good  Judgement:  Fair  Insight:  Good  Psychomotor Activity:  Normal  Concentration:  Concentration: Good and Attention Span: Good  Recall:  Good  Fund of  Knowledge:  Good  Language:  Good  Akathisia:  No  Handed:  Right  AIMS (if indicated):     Assets:  Communication Skills Desire for Improvement Financial Resources/Insurance Housing Physical Health Social Support Transportation  ADL's:  Intact  Cognition:  WNL  Sleep:  Number of Hours: 6.75   Problems Addressed: MDD severe Insomnia HIV  Treatment Plan Summary: Daily contact with patient to assess and evaluate symptoms and progress in treatment, Medication management and Plan is to:  -Increase Effexor-XR 75 mg PO Daily for mood stability -Continue Trazodone 100 mg PO QHS PRN for insomnia -Continue Vistaril 25 mg PO TID PRN for anxiety -Continue Biktarvy 50-200-25 mg PO Daily for HIV -Encourage group therapy participation -Check vitals for orthostatic hypotension  Maryfrances Bunnell, FNP 06/24/2017, 10:37 AM

## 2017-06-24 NOTE — Progress Notes (Signed)
D. Pt initially observed in dayroom interacting with peers. Pt pleasant, cooperative, and currently denies SI/HI and AVH and agrees to contact staff before acting on any harmful thoughts. Per pt's self inventory, pt rates his depression, anxiety and hopelessness a '0'.  Pt reports having slept well last night and has a 'good' appetite. Pt states his goal for today is "living my best life" and  "stop negative thoughts" to help meet his goal. A. Labs and vitals monitored. Pt compliant with medications. Pt supported emotionally and encouraged to express concerns and ask questions.   R. Pt remains safe with 15 minute checks. Will continue POC.

## 2017-06-24 NOTE — Progress Notes (Signed)
[facility] Group Notes:  (Nursing/MHT/Case Management/Adjunct)  Date:  06/24/2017  Time:  8:55 PM  Type of Therapy:  Psychoeducational Skills  Participation Level:  Active  Participation Quality:  Appropriate  Affect:  Appropriate  Cognitive:  Appropriate  Insight:  Appropriate  Engagement in Group:  Engaged  Modes of Intervention:  Education  Summary of Progress/Problems: Patient states that he had a "weird start" to his day due to a dream that he had last night. In addition, he spoke of feeling "emotional" at times, but he dealt with the situation by talking to his peers. He also stated that he had a good visit with his sister. As for the theme of the day, his coping skill will be to "sing".   [name], [name] S 06/24/2017, 8:55 PM

## 2017-06-24 NOTE — Progress Notes (Signed)
D.  Pt pleasant on approach, denies complaints at this time.  Pt was positive for evening wrap up group, observed engaged in appropriate interaction with peers on the unit.  Pt denies SI/HI/AVH at this time.  A.  Support and encouragement offered, medication given as ordered  R.  Pt remains safe on the unit, will continue to monitor.  

## 2017-06-24 NOTE — [facility] Group Notes (Signed)
[facility]  Date/Time:    06/24/2017 10:00-11:00AM  Type of Therapy and Topic:  Group Therapy:  Healthy vs Unhealthy Coping Skills  Participation Level:  Active   Description of Group:  The focus of this group was to determine what unhealthy coping techniques typically are used by group members and what healthy coping techniques would be helpful in coping with various problems. Patients were guided in becoming aware of the differences between healthy and unhealthy coping techniques.  Patients were asked to identify 1-2 healthy coping skills they would like to learn to use more effectively, and many mentioned meditation, breathing, and relaxation.  These were explained, samples demonstrated, and resources shared for how to learn more at discharge.   At the end of group, additional ideas of healthy coping skills were shared in a fun exercise.  Therapeutic Goals 1. Patients learned that coping is what human beings do all day long to deal with various situations in their lives 2. Patients defined and discussed healthy vs unhealthy coping techniques 3. Patients identified their preferred coping techniques and identified whether these were healthy or unhealthy 4. Patients determined 1-2 healthy coping skills they would like to become more familiar with and use more often, and practiced a few meditations 5. Patients provided support and ideas to each other  Summary of Patient Progress: During group, patient expressed that one coping technique he really needs to work on is expressing how he feels without having a bad attitude about it.  He participated fully until he finally left group with 10 minutes to go, did not return.   Therapeutic Modalities Cognitive Behavioral Therapy Motivational Interviewing   Ambrose MantleMareida Grossman-Orr, LCSW 06/24/2017, 12:00pm

## 2017-06-25 NOTE — Plan of Care (Signed)
Problem: Activity: Goal: Interest or engagement in activities will improve Outcome: Progressing Pt has been attending groups on the unit   

## 2017-06-25 NOTE — Progress Notes (Signed)
[facility] Group Notes:  (Nursing/MHT/Case Management/Adjunct)  Date:  06/25/2017  Time:  9:40 PM  Type of Therapy:  Psychoeducational Skills  Participation Level:  Active  Participation Quality:  Attentive  Affect:  Appropriate  Cognitive:  Appropriate  Insight:  Appropriate  Engagement in Group:  Developing/Improving  Modes of Intervention:  Education  Summary of Progress/Problems: The patient described his day as having been "okay" overall. He admits to having slept a great deal since he was upset about not getting discharged and because he is dealing with the loss of a relationship. In terms of the theme for the day, his support system will be comprised of his peers on the hallway.   Gerald Lee, Dayron Odland S 06/25/2017, 9:40 PM

## 2017-06-25 NOTE — Progress Notes (Signed)
D. Pt in bed upon initial approach- easily aroused from sleep for am med administration. Pt pleasant, cooperative and currently denies SI/HI and AVH -agreeing to contact staff before acting on any harmful thoughts.  A. Labs and vitals monitored. Pt compliant with medications. Pt supported emotionally and encouraged to express concerns and ask questions.   R. Pt remains safe with 15 minute checks. Will continue POC.

## 2017-06-25 NOTE — Plan of Care (Signed)
Problem: Activity: Goal: Interest or engagement in activities will improve Outcome: Progressing Patient has been observed interacting with peers in milieu and attending 'outdoor' activities

## 2017-06-25 NOTE — [facility] Group Notes (Signed)
[facility] Group Notes: (Clinical Social Work)   06/25/2017      Type of Therapy:  Group Therapy   Participation Level:  Did Not Attend despite MHT prompting   Michayla Mcneil Grossman-Orr, LCSW 06/25/2017, 12:37 PM     

## 2017-06-25 NOTE — Progress Notes (Signed)
[facility] MD Progress Note  06/25/2017 2:41 PM Gerald Lee  MRN:  [id number]   Subjective:  Patient reports that he woke up depressed this morning, because the person that caused him a lot of stress and depressed has found out about him being in the hospital. He is afraid that person will treat him differently now. Patient states he stayed in bed longer than he should have, but he felt depressed this morning.  Objective: Patient's chart and findings reviewed and discussed with treatment team. Patient is requested not to isolate when depressed and to use resources available to improve mood. Patient stated agreement and understanding. Patient is polite and cooperative. He has been interacting with peers and staff appropriately. Patient has been attending groups.   Principal Problem: MDD (major depressive disorder), recurrent episode, severe (HCC) Diagnosis:   Patient Active Problem List   Diagnosis Date Noted  . MDD (major depressive disorder), recurrent episode, severe (HCC) [F33.2] 06/21/2017  . HIV (human immunodeficiency virus infection) (HCC) [B20] 05/22/2017  . Macular rash [R21] 05/22/2017  . Healthcare maintenance [Z00.00] 05/22/2017  . Migraine without aura, without mention of intractable migraine without mention of status migrainosus [G43.009] 03/14/2014  . Episodic tension type headache [G44.219] 03/14/2014  . Obesity, unspecified [E66.9] 03/14/2014  . Insomnia, unspecified [G47.00] 03/14/2014   Total Time spent with patient: 15 minutes  Past Psychiatric History: See H&P  Past Medical History:  Past Medical History:  Diagnosis Date  . Asthma   . Headache(784.0)   . HIV infection [facility])     Past Surgical History:  Procedure Laterality Date  . CIRCUMCISION  1998  . HERNIA REPAIR     Done between the ages of 80 or 8 years   . NO PAST SURGERIES     Family History:  Family History  Problem Relation Age of Onset  . Stroke Mother   . Hypertension Mother   . Diabetes Father     Family Psychiatric  History: See H&P Social History:  History  Alcohol Use  . Yes    Comment: occ     History  Drug Use  . Types: Marijuana    Social History   Social History  . Marital status: Single    Spouse name: N/A  . Number of children: N/A  . Years of education: N/A   Social History Main Topics  . Smoking status: Current Every Day Smoker    Types: Cigarettes  . Smokeless tobacco: Never Used  . Alcohol use Yes     Comment: occ  . Drug use: Yes    Types: Marijuana  . Sexual activity: Yes    Partners: Male    Birth control/ protection: Condom   Other Topics Concern  . None   Social History Narrative  . None   Additional Social History:        Sleep: Good  Appetite:  Good  Current Medications: Current Facility-Administered Medications  Medication Dose Route Frequency Provider Last Rate Last Dose  . acetaminophen (TYLENOL) tablet 650 mg  650 mg Oral Q6H PRN Kerry Hough, PA-C      . alum & mag hydroxide-simeth (MAALOX/MYLANTA) 200-200-20 MG/5ML suspension 30 mL  30 mL Oral Q4H PRN Kerry Hough, PA-C      . bictegravir-emtricitabine-tenofovir AF (BIKTARVY) 50-200-25 MG per tablet 1 tablet  1 tablet Oral Daily Kerry Hough, PA-C   1 tablet at 06/25/17 0843  . hydrOXYzine (ATARAX/VISTARIL) tablet 25 mg  25 mg Oral TID PRN Melvenia Beam,  Spencer E, PA-C   25 mg at 06/24/17 2255  . ibuprofen (ADVIL,MOTRIN) tablet 800 mg  800 mg Oral TID PRN Kerry Hough, PA-C      . magnesium hydroxide (MILK OF MAGNESIA) suspension 30 mL  30 mL Oral Daily PRN Kerry Hough, PA-C      . traZODone (DESYREL) tablet 100 mg  100 mg Oral QHS PRN Cobos, Rockey Situ, MD   100 mg at 06/24/17 2231  . venlafaxine XR (EFFEXOR-XR) 24 hr capsule 75 mg  75 mg Oral Q breakfast Lorae Roig, Gerlene Burdock, FNP   75 mg at 06/25/17 1610    Lab Results: No results found for this or any previous visit (from the past 48 hour(s)).  Blood Alcohol level:  Lab Results  Component Value Date   ETH  <10 06/20/2017    Metabolic Disorder Labs: No results found for: HGBA1C, MPG No results found for: PROLACTIN Lab Results  Component Value Date   CHOL 122 05/22/2017   TRIG 86 05/22/2017   HDL 21 (L) 05/22/2017   CHOLHDL 5.8 (H) 05/22/2017    Physical Findings: AIMS: Facial and Oral Movements Muscles of Facial Expression: None, normal Lips and Perioral Area: None, normal Jaw: None, normal Tongue: None, normal,Extremity Movements Upper (arms, wrists, hands, fingers): None, normal Lower (legs, knees, ankles, toes): None, normal, Trunk Movements Neck, shoulders, hips: None, normal, Overall Severity Severity of abnormal movements (highest score from questions above): None, normal Incapacitation due to abnormal movements: None, normal Patient's awareness of abnormal movements (rate only patient's report): No Awareness, Dental Status Current problems with teeth and/or dentures?: No Does patient usually wear dentures?: No  CIWA:    COWS:     Musculoskeletal: Strength & Muscle Tone: within normal limits Gait & Station: normal Patient leans: N/A  Psychiatric Specialty Exam: Physical Exam  Nursing note and vitals reviewed. Constitutional: He is oriented to person, place, and time. He appears well-developed and well-nourished.  Cardiovascular: Normal rate.   Respiratory: Effort normal.  Musculoskeletal: Normal range of motion.  Neurological: He is alert and oriented to person, place, and time.  Skin: Skin is warm.    Review of Systems  Constitutional: Negative.   HENT: Negative.   Eyes: Negative.   Respiratory: Negative.   Cardiovascular: Negative.   Gastrointestinal: Negative.   Genitourinary: Negative.   Musculoskeletal: Negative.   Skin: Negative.   Neurological: Negative.   Endo/Heme/Allergies: Negative.   Psychiatric/Behavioral: Positive for depression. The patient is nervous/anxious.     Blood pressure 120/64, pulse 76, temperature 97.9 F (36.6 C), temperature  source Oral, resp. rate 18, height 6\' 4"  (1.93 m), weight 103.9 kg (229 lb).Body mass index is 27.87 kg/m.  General Appearance: Casual  Eye Contact:  Good  Speech:  Clear and Coherent and Normal Rate  Volume:  Normal  Mood:  Depressed  Affect:  Flat  Thought Process:  Goal Directed and Descriptions of Associations: Intact  Orientation:  Full (Time, Place, and Person)  Thought Content:  WDL  Suicidal Thoughts:  No  Homicidal Thoughts:  No  Memory:  Immediate;   Good Recent;   Good Remote;   Good  Judgement:  Fair  Insight:  Good  Psychomotor Activity:  Normal  Concentration:  Concentration: Good and Attention Span: Good  Recall:  Good  Fund of Knowledge:  Good  Language:  Good  Akathisia:  No  Handed:  Right  AIMS (if indicated):     Assets:  Communication Skills Desire for Improvement Financial  Resources/Insurance Housing Physical Health Social Support Transportation  ADL's:  Intact  Cognition:  WNL  Sleep:  Number of Hours: 6.75   Problems Addressed: MDD severe Insomnia HIV  Treatment Plan Summary: Daily contact with patient to assess and evaluate symptoms and progress in treatment, Medication management and Plan is to:  -Continue Effexor-XR 75 mg PO Daily for mood stability -Continue Trazodone 100 mg PO QHS PRN for insomnia -Continue Vistaril 25 mg PO TID PRN for anxiety -Continue Biktarvy 50-200-25 mg PO Daily for HIV -Encourage group therapy participation  Maryfrances Bunnellravis B Shaheem Pichon, FNP 06/25/2017, 2:41 PM

## 2017-06-25 NOTE — Progress Notes (Signed)
D.  Pt pleasant on approach, denies complaints at this time.  Pt was positive for evening wrap up group, observed engaged in appropriate interacting with peers on the unit.  Pt denies SI/HI/AVH at this time.  A.  Pt's blood pressure low in AM so encouraged fluids and provided Gatorade to Pt.   Support and encouragement offered.  Medication given as ordered.  R.  Pt remains safe on the unit, will continue to monitor.

## 2017-06-25 NOTE — Progress Notes (Signed)
Patient observed interacting with peers in milieu and attending group activity in gym.

## 2017-06-26 DIAGNOSIS — R450 Nervousness: Secondary | ICD-10-CM

## 2017-06-26 NOTE — [facility] Group Notes (Signed)
LCSW Group Therapy Note   06/26/2017 1:15pm   Type of Therapy and Topic:  Group Therapy:  Overcoming Obstacles   Participation Level:  Active   Description of Group:    In this group patients will be encouraged to explore what they see as obstacles to their own wellness and recovery. They will be guided to discuss their thoughts, feelings, and behaviors related to these obstacles. The group will process together ways to cope with barriers, with attention given to specific choices patients can make. Each patient will be challenged to identify changes they are motivated to make in order to overcome their obstacles. This group will be process-oriented, with patients participating in exploration of their own experiences as well as giving and receiving support and challenge from other group members.   Therapeutic Goals: 1. Patient will identify personal and current obstacles as they relate to admission. 2. Patient will identify barriers that currently interfere with their wellness or overcoming obstacles.  3. Patient will identify feelings, thought process and behaviors related to these barriers. 4. Patient will identify two changes they are willing to make to overcome these obstacles:      Summary of Patient Progress Pt was focused on the stigma related to his mental health, chronic health conditions, and sexuality and how they serve as obstacles. Pt also focused on his previous relationship and difficulty creating boundaries.      Therapeutic Modalities:   Cognitive Behavioral Therapy Solution Focused Therapy Motivational Interviewing Relapse Prevention Therapy  Verdene LennertLauren C Nicholas Trompeter, LCSW 06/26/2017 4:01 PM

## 2017-06-26 NOTE — Progress Notes (Signed)
D: Pt presents with a flat affect and depressed mood. Pt appeared to be sad on approach but didn't disclose any concerns to Clinical research associatewriter. Pt reports decreased depression and anx today. Pt reports fair sleep. Pt denies AVH. Pt denies SI/HI. Pt appears guarded today and responds with vague responses during shift assessment. Pt verbalized tolerating meds well and denies any side effects. A: Medications reviewed with pt. Medications administered as ordered per MD. Verbal support provided. Pt encouraged to attend groups. 15 minute checks performed for safety. R: Pt complaint with tx.

## 2017-06-26 NOTE — Progress Notes (Signed)
Recreation Therapy Notes  Date: 06/26/17 Time: 0930 Location: 300 [address] Dayroom  Group Topic: Stress Management  Goal Area(s) Addresses:  Patient will verbalize importance of using healthy stress management.  Patient will identify positive emotions associated with healthy stress management.   Behavioral Response: Engaged  Intervention: Stress Management  Activity :  Meditation.  LRT introduced the stress management technique of meditation.  LRT read script that allowed patients to sit quietly, focus on their breathing and just being.  Education:  Stress Management, Discharge Planning.   Education Outcome: Acknowledges edcuation/In group clarification offered/Needs additional education  Clinical Observations/Feedback: Pt attended group.    Gerald Lee, Gerald Lee         [name], [name] 06/26/2017 11:04 AM

## 2017-06-26 NOTE — Progress Notes (Signed)
[facility] MD Progress Note  06/26/2017 1:35 PM Gerald Lee  MRN:  [id number]   Subjective: [name] reports, I came to the hospital because of worsening depression. I had a lot going on that I never dealt with, then, I recently got diagnosed with HIV. Then, all the feeling came crashing down. I'm glad that I came here. I'm taking medications, tolerating the medicines. I'm involved in the group sessions, learning coping skills. My sleep has improved. I'm also happy that I will be connected with counseling services after discharge. I will then take it one day at a time from here on".  Objective: Gerald Lee is seen, chart reviewed & findings discussed with the treatment team. He is visible on the unit, attending group sessions. He is taking & tolerating his medication regimen. He says he is doing much better than when first admitted to the unit. He says besides medication management, he is happy that he will be connected with a place for counseling sessions after discharge. He says he will from here onwards start dealing with all the issues that were contributing to his mental health issues. He denies any new issues. He does not appear to be responding to any internal stimuli. No disruptive behavior on the unit. Staff continues to provide support & encouragement.   Principal Problem: MDD (major depressive disorder), recurrent episode, severe (HCC)  Diagnosis:   Patient Active Problem List   Diagnosis Date Noted  . MDD (major depressive disorder), recurrent episode, severe (HCC) [F33.2] 06/21/2017  . HIV (human immunodeficiency virus infection) (HCC) [B20] 05/22/2017  . Macular rash [R21] 05/22/2017  . Healthcare maintenance [Z00.00] 05/22/2017  . Migraine without aura, without mention of intractable migraine without mention of status migrainosus [G43.009] 03/14/2014  . Episodic tension type headache [G44.219] 03/14/2014  . Obesity, unspecified [E66.9] 03/14/2014  . Insomnia, unspecified [G47.00] 03/14/2014    Total Time spent with patient: 15 minutes  Past Psychiatric History: See H&P  Past Medical History:  Past Medical History:  Diagnosis Date  . Asthma   . Headache(784.0)   . HIV infection [facility])     Past Surgical History:  Procedure Laterality Date  . CIRCUMCISION  1998  . HERNIA REPAIR     Done between the ages of 85 or 8 years   . NO PAST SURGERIES     Family History:  Family History  Problem Relation Age of Onset  . Stroke Mother   . Hypertension Mother   . Diabetes Father    Family Psychiatric  History: See H&P  Social History:  History  Alcohol Use  . Yes    Comment: occ     History  Drug Use  . Types: Marijuana    Social History   Social History  . Marital status: Single    Spouse name: N/A  . Number of children: N/A  . Years of education: N/A   Social History Main Topics  . Smoking status: Current Every Day Smoker    Types: Cigarettes  . Smokeless tobacco: Never Used  . Alcohol use Yes     Comment: occ  . Drug use: Yes    Types: Marijuana  . Sexual activity: Yes    Partners: Male    Birth control/ protection: Condom   Other Topics Concern  . None   Social History Narrative  . None   Additional Social History:   Sleep: Good  Appetite:  Good  Current Medications: Current Facility-Administered Medications  Medication Dose Route Frequency Provider Last Rate  Last Dose  . acetaminophen (TYLENOL) tablet 650 mg  650 mg Oral Q6H PRN Kerry HoughSimon, Spencer E, PA-C      . alum & mag hydroxide-simeth (MAALOX/MYLANTA) 200-200-20 MG/5ML suspension 30 mL  30 mL Oral Q4H PRN Kerry HoughSimon, Spencer E, PA-C      . bictegravir-emtricitabine-tenofovir AF (BIKTARVY) 50-200-25 MG per tablet 1 tablet  1 tablet Oral Daily Kerry HoughSimon, Spencer E, PA-C   1 tablet at 06/26/17 0810  . hydrOXYzine (ATARAX/VISTARIL) tablet 25 mg  25 mg Oral TID PRN Kerry HoughSimon, Spencer E, PA-C   25 mg at 06/24/17 2255  . ibuprofen (ADVIL,MOTRIN) tablet 800 mg  800 mg Oral TID PRN Kerry HoughSimon, Spencer E, PA-C       . magnesium hydroxide (MILK OF MAGNESIA) suspension 30 mL  30 mL Oral Daily PRN Kerry HoughSimon, Spencer E, PA-C      . traZODone (DESYREL) tablet 100 mg  100 mg Oral QHS PRN Cobos, Rockey SituFernando A, MD   100 mg at 06/25/17 2233  . venlafaxine XR (EFFEXOR-XR) 24 hr capsule 75 mg  75 mg Oral Q breakfast Money, Gerlene Burdockravis B, FNP   75 mg at 06/26/17 [id number]   Lab Results: No results found for this or any previous visit (from the past 48 hour(s)).  Blood Alcohol level:  Lab Results  Component Value Date   ETH <10 06/20/2017   Metabolic Disorder Labs: No results found for: HGBA1C, MPG No results found for: PROLACTIN Lab Results  Component Value Date   CHOL 122 05/22/2017   TRIG 86 05/22/2017   HDL 21 (L) 05/22/2017   CHOLHDL 5.8 (H) 05/22/2017   Physical Findings: AIMS: Facial and Oral Movements Muscles of Facial Expression: None, normal Lips and Perioral Area: None, normal Jaw: None, normal Tongue: None, normal,Extremity Movements Upper (arms, wrists, hands, fingers): None, normal Lower (legs, knees, ankles, toes): None, normal, Trunk Movements Neck, shoulders, hips: None, normal, Overall Severity Severity of abnormal movements (highest score from questions above): None, normal Incapacitation due to abnormal movements: None, normal Patient's awareness of abnormal movements (rate only patient's report): No Awareness, Dental Status Current problems with teeth and/or dentures?: No Does patient usually wear dentures?: No  CIWA:    COWS:     Musculoskeletal: Strength & Muscle Tone: within normal limits Gait & Station: normal Patient leans: N/A  Psychiatric Specialty Exam: Physical Exam  Nursing note and vitals reviewed. Constitutional: He is oriented to person, place, and time. He appears well-developed and well-nourished.  Cardiovascular: Normal rate.   Respiratory: Effort normal.  Musculoskeletal: Normal range of motion.  Neurological: He is alert and oriented to person, place, and time.   Skin: Skin is warm.    Review of Systems  Constitutional: Negative.   HENT: Negative.   Eyes: Negative.   Respiratory: Negative.   Cardiovascular: Negative.   Gastrointestinal: Negative.   Genitourinary: Negative.   Musculoskeletal: Negative.   Skin: Negative.   Neurological: Negative.   Endo/Heme/Allergies: Negative.   Psychiatric/Behavioral: Positive for depression. The patient is nervous/anxious.     Blood pressure (!) 114/45, pulse 83, temperature 98.3 F (36.8 C), temperature source Oral, resp. rate 18, height 6\' 4"  (1.93 m), weight 103.9 kg (229 lb).Body mass index is 27.87 kg/m.  General Appearance: Casual  Eye Contact:  Good  Speech:  Clear and Coherent and Normal Rate  Volume:  Normal  Mood:  Depressed  Affect:  Flat  Thought Process:  Goal Directed and Descriptions of Associations: Intact  Orientation:  Full (Time, Place, and Person)  Thought  Content:  WDL  Suicidal Thoughts:  No  Homicidal Thoughts:  No  Memory:  Immediate;   Good Recent;   Good Remote;   Good  Judgement:  Fair  Insight:  Good  Psychomotor Activity:  Normal  Concentration:  Concentration: Good and Attention Span: Good  Recall:  Good  Fund of Knowledge:  Good  Language:  Good  Akathisia:  No  Handed:  Right  AIMS (if indicated):     Assets:  Communication Skills Desire for Improvement Financial Resources/Insurance Housing Physical Health Social Support Transportation  ADL's:  Intact  Cognition:  WNL  Sleep:  Number of Hours: 6   Problems Addressed: MDD severe Insomnia HIV  Treatment Plan Summary: Daily contact with patient to assess and evaluate symptoms and progress in treatment.  Will continue today 06/26/17 plan as below except where it is noted.  Major depression: -Continue Effexor-XR 75 mg PO Daily for mood stability  Insomnia: -Continue Trazodone 100 mg PO QHS PRN for insomnia  Anxiety: -Continue Vistaril 25 mg PO TID PRN for anxiety.  HIV  infection: -Continue Biktarvy 50-200-25 mg PO Daily for HIV.  - Continue 15 minutes observation for safety concerns  - Encouraged to participate in milieu therapy and group therapy counseling sessions and also work with coping skills  -  Develop treatment plan to decrease risk of relapse upon discharge and to reduce the need for readmission  Sanjuana Kava, NP, PMHNP, FNP-BC 06/26/2017, 1:35 PM Patient ID: Gerald Lee, male   DOB: [birth year], 21 y.o.   MRN: [id number]

## 2017-06-26 NOTE — Progress Notes (Signed)
Adult Psychoeducational Group Note  Date:  06/26/2017 Time:  11:55 PM  Group Topic/Focus:  Wrap-Up Group:   The focus of this group is to help patients review their daily goal of treatment and discuss progress on daily workbooks.  Participation Level:  Active  Participation Quality:  Attentive  Affect:  Appropriate  Cognitive:  Alert and Appropriate  Insight: Good  Engagement in Group:  Engaged  Modes of Intervention:  Activity  Additional Comments:  Pt rated his day a 10. Goal is to control his emotions and express his self in a more positive way towards others.  Gerald MeadKiara M Jacquita Lee 06/26/2017, 11:55 PM

## 2017-06-27 DIAGNOSIS — A539 Syphilis, unspecified: Secondary | ICD-10-CM

## 2017-06-27 DIAGNOSIS — A749 Chlamydial infection, unspecified: Secondary | ICD-10-CM

## 2017-06-27 MED ORDER — VENLAFAXINE HCL ER 75 MG PO CP24: 75.0000 mg | ORAL_CAPSULE | Freq: Every day | ORAL | 0 refills | Status: DC

## 2017-06-27 MED ORDER — BICTEGRAVIR-EMTRICITAB-TENOFOV 50-200-25 MG PO TABS: 1.0000 | ORAL_TABLET | Freq: Every day | ORAL | 0 refills | Status: DC

## 2017-06-27 MED ORDER — TRAZODONE HCL 100 MG PO TABS: 100.0000 mg | ORAL_TABLET | Freq: Every evening | ORAL | 0 refills | Status: DC | PRN

## 2017-06-27 MED ORDER — HYDROXYZINE HCL 25 MG PO TABS: 25.0000 mg | ORAL_TABLET | Freq: Three times a day (TID) | ORAL | 0 refills | Status: DC | PRN

## 2017-06-27 NOTE — [facility] Suicide Risk Assessment (Signed)
[facility]   Principal Problem: MDD (major depressive disorder), recurrent episode, severe (HCC) Discharge Diagnoses:  Patient Active Problem List   Diagnosis Date Noted  . MDD (major depressive disorder), recurrent episode, severe (HCC) [F33.2] 06/21/2017  . HIV (human immunodeficiency virus infection) (HCC) [B20] 05/22/2017  . Macular rash [R21] 05/22/2017  . Healthcare maintenance [Z00.00] 05/22/2017  . Migraine without aura, without mention of intractable migraine without mention of status migrainosus [G43.009] 03/14/2014  . Episodic tension type headache [G44.219] 03/14/2014  . Obesity, unspecified [E66.9] 03/14/2014  . Insomnia, unspecified [G47.00] 03/14/2014    Total Time spent with patient: 30 minutes  Musculoskeletal: Strength & Muscle Tone: within normal limits Gait & Station: normal Patient leans: N/A  Psychiatric Specialty Exam: ROS  Blood pressure 111/62, pulse 83, temperature 98.1 F (36.7 C), temperature source Oral, resp. rate 18, height 6\' 4"  (1.93 m), weight 103.9 kg (229 lb).Body mass index is 27.87 kg/m.  General Appearance: improved grooming   Eye Contact::  Good  Speech:  Normal Rate  Volume:  Normal  Mood:  improved , denies feeling depressed at this time  Affect:  more reactive  Thought Process:  Linear and Descriptions of Associations: Intact  Orientation:  Full (Time, Place, and Person)  Thought Content:  no hallucinations, no delusions, not internally preoccupied   Suicidal Thoughts:  No denies any suicidal or self injurious ideations, denies any homicidal or violent ideations   Homicidal Thoughts:  No  Memory:  recent and remote grossly intact   Judgement:  Other:  improved   Insight:  improved   Psychomotor Activity:  Normal  Concentration:  Good  Recall:  Good  Fund of Knowledge:Good  Language: Good  Akathisia:  Negative  Handed:  Right  AIMS (if indicated):     Assets:  Communication Skills Desire for  Improvement Resilience  Sleep:  Number of Hours: 5.75  Cognition: WNL  ADL's:  Intact   Mental Status Per Nursing Assessment::   On Admission:  Self-harm thoughts  Demographic Factors:  21 year old single male   Loss Factors: Recent HIV diagnosis, recent break up   Historical Factors: No prior psychiatric admissions, history of self cutting, history of one suicide attempt at age 21.  Risk Reduction Factors:   Sense of responsibility to family, Living with another person, especially a relative and Positive coping skills or problem solving skills  Continued Clinical Symptoms:  At this time patient is alert, attentive, well related, pleasant , mood improved and presents euthymic, affect appropriate, reactive, no thought disorder, no suicidal or self injurious ideations, no homicidal or violent ideations, no psychotic symptoms, future oriented. Denies medication side effects. Behavior on unit calm and in good control.   Cognitive Features That Contribute To Risk:  No gross cognitive deficits noted upon discharge. Is alert , attentive, and oriented x 3   Suicide Risk:  Mild:  Suicidal ideation of limited frequency, intensity, duration, and specificity.  There are no identifiable plans, no associated intent, mild dysphoria and related symptoms, good self-control (both objective and subjective assessment), few other risk factors, and identifiable protective factors, including available and accessible social support.  Follow-up Information    [facility] Follow up on 07/03/2017.   Specialty:  Family Medicine Why:  Therapy assessment at 3:00pm. Medication management appointment scheduled for the same day at 5:30pm with Dr. Midge AverPavelock. Contact information: [address] [place] [address] [phone]  Plan Of Care/Follow-up recommendations:  Activity:  as tolerated Diet:  Regular Tests:  NA Other:  See below  Patient is  expressing readiness for discharge, and there are no grounds for involuntary commitment  Patient states he is going to go live with a friend Follow up as above  Follows up at [facility] - states he has appt 11/15.   Craige Cotta, MD 06/27/2017, 11:13 AM

## 2017-06-27 NOTE — Progress Notes (Signed)
D: Patient pleasant and cooperative with care this shift and is noted to interact well with peers in the milieu. Pt noted to sing, laugh and joke with peers in the milieu. Pt did request sleep aid and anti-anxiety medication, which were both effective. A: Encourage staff/peer interaction, medication compliance, and group participation. Administer medications as ordered, maintain Q 15 minute safety checks.R: Pt compliant with medications and attended group session. Pt denies SI at this time and verbally contracts for safety. No signs/symptoms of distress noted.

## 2017-06-27 NOTE — Progress Notes (Signed)
Patient verbalizes readiness for discharge. Follow up plan explained, AVS, transition record and SRA given along with prescriptions. All belongings returned. Patient verbalizes understanding. Denies SI/HI and assures this Clinical research associatewriter he will seek assistance should that change. Patient discharged ambulatory and in stable condition to sister.
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
## 2017-06-27 NOTE — Progress Notes (Signed)
D: Patient observed up and visible in the milieu. Patient verbalizes to this Clinical research associatewriter he is doing well and verbalizes no complaints, concersn. Patient's affect anxious, mood congruent but pleasant. Per self inventory and discussions with writer, rates depression, hopelessness and anxiety all at a 0/10. Rates sleep as good, appetite as good, energy as normal and concentration as good.  States goal for today is "ready to leave, think positive." Denies pain, physical problems.   A: Medicated per orders, no prns requested or required. Level III obs in place for safety. Emotional support offered and self inventory reviewed. Encouraged completion of Suicide Safety Plan and programming participation. Discussed POC with MD, SW.   R: Patient verbalizes understanding of POC. Patient denies SI/HI/AVH and remains safe on level III obs. Will continue to monitor closely and make verbal contact frequently. Per treatment team, patient identified for discharge.

## 2017-06-27 NOTE — Progress Notes (Signed)
Recreation Therapy Notes  Animal-Assisted Activity (AAA) Program Checklist/Progress Notes Patient Eligibility Criteria Checklist & Daily Group note for Rec TxIntervention  Date: 10.30.2018 Time: 2:45pm [address]   AAA/T Program Assumption of Risk Form signed by Patient/ or Parent Legal Guardian Yes  Patient is free of allergies or sever asthma Yes  Patient reports no fear of animals Yes  Patient reports no history of cruelty to animals Yes  Patient understands his/her participation is voluntary Yes  Behavioral Response: Did not attend.   Marykay Lexenise L Kentley Cedillo, LRT/CTRS       Briyanna Billingham L 06/27/2017 3:07 PM

## 2017-06-27 NOTE — Progress Notes (Signed)
  [facility]  Will you be returning to the same living situation after discharge:  No. Pt discharging to friend's home At discharge, do you have transportation home?: Yes,  Pt mother to pick up Do you have the ability to pay for your medications: Yes,  Pt provided with prescriptions  Release of information consent forms completed and in the chart;  Patient's signature needed at discharge.  Patient to Follow up at: Follow-up Information    [facility] Follow up on 07/03/2017.   Specialty:  Family Medicine Why:  Therapy assessment at 3:00pm. Medication management appointment scheduled for the same day at 5:30pm with Dr. Midge AverPavelock. Contact information: [address] [place] [address] [phone]           Next level [facility] care provider [facility]:no  Safety Planning and Suicide Prevention discussed: Yes,  with mother; see SPE note  Have you used any form of tobacco in the last 30 days? (Cigarettes, Smokeless Tobacco, Cigars, and/or Pipes): Yes  Has patient been referred to the Quitline?: Patient refused referral  Patient has been referred for addiction treatment: Yes  Verdene LennertLauren C Bernadette Armijo, LCSW 06/27/2017, 9:01 AM

## 2017-07-11 ENCOUNTER — Encounter: Payer: Self-pay | Admitting: Infectious Diseases

## 2017-07-11 ENCOUNTER — Ambulatory Visit (INDEPENDENT_AMBULATORY_CARE_PROVIDER_SITE_OTHER): Payer: Medicaid Other | Admitting: Licensed Clinical Social Worker

## 2017-07-11 ENCOUNTER — Ambulatory Visit (INDEPENDENT_AMBULATORY_CARE_PROVIDER_SITE_OTHER): Payer: Medicaid Other | Admitting: Infectious Diseases

## 2017-07-11 VITALS — BP 111/73 | HR 74 | Wt 250.0 lb

## 2017-07-11 DIAGNOSIS — Z23 Encounter for immunization: Secondary | ICD-10-CM

## 2017-07-11 DIAGNOSIS — B20 Human immunodeficiency virus [HIV] disease: Secondary | ICD-10-CM

## 2017-07-11 DIAGNOSIS — F331 Major depressive disorder, recurrent, moderate: Secondary | ICD-10-CM

## 2017-07-11 DIAGNOSIS — F321 Major depressive disorder, single episode, moderate: Secondary | ICD-10-CM

## 2017-07-11 DIAGNOSIS — Z8619 Personal history of other infectious and parasitic diseases: Secondary | ICD-10-CM | POA: Insufficient documentation

## 2017-07-11 DIAGNOSIS — Z0000 Encounter for general adult medical examination without abnormal findings: Secondary | ICD-10-CM

## 2017-07-11 NOTE — Progress Notes (Signed)
Gerald Lee  1995-12-24 [id number]   Brief Narrative: Gerald Lee is a 21 y.o. AA male diagnosed with HIV infection. Diagnosed in ER September 2018. Started right away on Biktarvy. Also diagnosed with syphilis at the same time. Was assessed for neurosyphilis with severe headaches but LP negative.   HIV Resistance Mutations: none  Previous OIs: none  Past STIs: syphilis, chlamydia at entry to care 2018  PCP - Fleet ContrasAvbuere, Edwin, MD    Patient Active Problem List   Diagnosis Date Noted  . History of syphilis 07/11/2017  . 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  . HIV (human immunodeficiency virus infection) (HCC) 05/22/2017  . Healthcare maintenance 05/22/2017  . Migraine without aura 03/14/2014  . Episodic tension type headache 03/14/2014  . Obesity, unspecified 03/14/2014  . Insomnia, unspecified 03/14/2014      Medication List        Accurate as of 07/11/17 12:11 PM. Always use your most recent med list.          bictegravir-emtricitabine-tenofovir AF 50-200-25 MG Tabs tablet Commonly known as:  BIKTARVY Take 1 tablet by mouth daily. For HIV   hydrOXYzine 25 MG tablet Commonly known as:  ATARAX/VISTARIL Take 1 tablet (25 mg total) by mouth 3 (three) times daily as needed for anxiety.   traZODone 100 MG tablet Commonly known as:  DESYREL Take 1 tablet (100 mg total) by mouth at bedtime as needed for sleep.   venlafaxine XR 75 MG 24 hr capsule Commonly known as:  EFFEXOR-XR Take 1 capsule (75 mg total) by mouth daily with breakfast. For mood control       Subjective: Gerald Lee presents to clinic today for routine follow up care for his HIV infection.  HPI:  HIV =  Curently taking Biktarvy. No reported side effects. Has only missed one dose since last visit due to a busy day but has made modifications since then to help accommodate for these days. Endorses no complaints today suggestive of associated opportunistic infection  or advancing HIV disease such as fevers, night sweats, weight loss, anorexia, cough, SOB, nausea, vomiting, diarrhea, headache, sensory changes, lymphadenopathy or oral thrush. Not currently sexually active and not currently with any partner(s).   History of Syphilis = 04/2017 with macular rash and RPR 1:64. LP done to r/o neurosyphilis with complaints of severe headaches and black out spells - negative. Was treated with bicillin x 3 for late latent. Had penile lesion in 2016 that likely was chancer as it was negative for HSV.  Rash has resolved.   Headaches = ongoing. Reports some improvement after starting antidepressants and trazodone. Denies flashes of light or other signs aura. Does report some episodes where he has "black outs" during severe headaches. Takes ibuprofen 800 mg for these episodes but has not needed to take anything since [facility]. He is concerned about possibility of seizures as some of his family members have a history.   Depression with SI = [facility] with SI and worsening depression. Was started on Effexor, Trazodone and Hydroxyzine and reports his mood is overall much improved. Sleeping well and feels much better. No current SI or intention; some thoughts on occasion but feels he is in a "much better place" and [facility].   Review of Systems: Review of Systems  Constitutional: Negative for chills, fever, malaise/fatigue and weight loss.  HENT: Negative for sore throat.  No dental problems  Respiratory: Negative for cough and sputum production.   Cardiovascular: Negative for chest pain and leg swelling.  Gastrointestinal: Negative for abdominal pain, diarrhea and vomiting.  Genitourinary: Negative for dysuria and flank pain.  Musculoskeletal: Negative for joint pain, myalgias and neck pain.  Skin: Negative for rash.  Neurological: Positive for headaches. Negative for dizziness and tingling.    Psychiatric/Behavioral: Negative for depression and substance abuse. The patient is not nervous/anxious and does not have insomnia.     Past Medical History:  Diagnosis Date  . Asthma   . Headache(784.0)   . HIV infection (HCC)     Social History   Tobacco Use  . Smoking status: Current Every Day Smoker    Types: Cigarettes  . Smokeless tobacco: Never Used  Substance Use Topics  . Alcohol use: Yes    Comment: occ  . Drug use: Yes    Types: Marijuana    Family History  Problem Relation Age of Onset  . Stroke Mother   . Hypertension Mother   . Diabetes Father     No Known Allergies  Objective:  Vitals:   07/11/17 1111  BP: 111/73  Pulse: 74  Weight: 250 lb (113.4 kg)   Body mass index is 30.43 kg/m.  Physical Exam  Constitutional: He is oriented to person, place, and time and well-developed, well-nourished, and in no distress.  Sister present with patient today.   HENT:  Mouth/Throat: Oropharynx is clear and moist. No oral lesions. Normal dentition. No dental caries.  Eyes: No scleral icterus.  Cardiovascular: Normal rate, regular rhythm and normal heart sounds.  Pulmonary/Chest: Effort normal and breath sounds normal.  Abdominal: Soft. He exhibits no distension. There is no tenderness.  Lymphadenopathy:    He has no cervical adenopathy.  Neurological: He is alert and oriented to person, place, and time.  Skin: Skin is warm and dry. No rash noted.  Psychiatric: Memory, affect and judgment normal. His mood appears not anxious. He does not exhibit a depressed mood.  In good spirits and smiling. Good eye contact.     Lab Results Lab Results  Component Value Date   WBC 6.6 06/20/2017   HGB 13.9 06/20/2017   HCT 41.6 06/20/2017   MCV 82.4 06/20/2017   PLT 194 06/20/2017    Lab Results  Component Value Date   CREATININE 1.17 06/20/2017   BUN 10 06/20/2017   NA 139 06/20/2017   K 3.7 06/20/2017   CL 106 06/20/2017   CO2 25 06/20/2017    Lab  Results  Component Value Date   ALT 15 (L) 06/20/2017   AST 19 06/20/2017   ALKPHOS 89 06/20/2017   BILITOT 1.2 06/20/2017    Lab Results  Component Value Date   CHOL 122 05/22/2017   HDL 21 (L) 05/22/2017   TRIG 86 05/22/2017   CHOLHDL 5.8 (H) 05/22/2017   HIV 1 RNA Quant (Copies/mL)  Date Value  05/22/2017 10,400 (H)   CD4 T Cell Abs (/uL)  Date Value  05/17/2017 770   No results found for: HAV Lab Results  Component Value Date   HEPBSAG NON-REACTIVE 05/22/2017   HEPBSAB NON-REACTIVE 05/22/2017   No results found for: HCVAB Lab Results  Component Value Date   CHLAMYDIAWP Negative 05/22/2017   CHLAMYDIAWP **POSITIVE** (A) 05/22/2017   N Negative 05/22/2017   N Negative 05/22/2017   No results found for: GCPROBEAPT No results found for: QUANTGOLD No results found for: RPR    Problem  List Items Addressed This Visit      Other   Healthcare maintenance    Hep B series #1 today. Will administer #2 at return visit. Pneumococcal vaccine and TDap given while inpatient. Would benefit from HPV and menveo at upcoming appointments.       Relevant Orders   Hepatitis B vaccine adult IM (Completed)   History of syphilis    Rash has resolved after appropriate treatment. Will recheck RPR today. Counseling on safe sex and prevention of reinfection provided today.       HIV (human immunodeficiency virus infection) (HCC) - Primary    Doing well on Biktarvy with no side effects. Will continue and check VL today. Continued HIV education, safe sex counseling today.  Return to clinic in 6 weeks for continued monitoring. BMET today to check kidney function on medications. He is having difficulty paying rent with multiple health needs lately - he still maintains employment however he does not have enough of a paycheck for rent of $580 due Friday. I introduced him to [place] with [facility] today who referred him to [employer] for assistance. Will have him enroll formally with [facility].  Dental release signed today.       Relevant Orders   HIV 1 RNA quant-no reflex-bld   RPR   Basic metabolic panel   Moderate episode of recurrent major depressive disorder (HCC)    Improved - managed on current regimen by [facility] provider. Will have him meet with Cordelia Pen today.          Rexene Alberts, MSN, NP-C [facility] Pager: [phone]  07/11/17 12:11 PM

## 2017-07-11 NOTE — Assessment & Plan Note (Addendum)
Rash has resolved after appropriate treatment. Will recheck RPR today. Counseling on safe sex and prevention of reinfection provided today.

## 2017-07-11 NOTE — Patient Instructions (Signed)
Continue taking your Biktarvy once a day.   Would encourage you to make an appointment with you primary care provider if you still suffer from headaches after you have given your Effexor and trazodone some more time to work.   Will start your Hepatitis B vaccine series today.   Will have you back in 6 weeks to check back with us and get your second hep b shot.    Please sign up with MyChart to access your labs and set up email communication with our clinic for non-urgent medical concerns.

## 2017-07-11 NOTE — Assessment & Plan Note (Signed)
Improved - [facility]. Will have him meet with Cordelia PenSherry today.

## 2017-07-11 NOTE — Assessment & Plan Note (Signed)
Hep B series #1 today. Will administer #2 at return visit. Pneumococcal vaccine and TDap given while inpatient. Would benefit from HPV and menveo at upcoming appointments.

## 2017-07-11 NOTE — Assessment & Plan Note (Signed)
Encouraged FU with primary team for management as he may need prophylaxis.

## 2017-07-11 NOTE — Assessment & Plan Note (Addendum)
Doing well on Biktarvy with no side effects. Will continue and check VL today. Continued HIV education, safe sex counseling today.  Return to clinic in 6 weeks for continued monitoring. BMET today to check kidney function on medications. He is having difficulty paying rent with multiple health needs lately - he still maintains employment however he does not have enough of a paycheck for rent of $580 due Friday. I introduced him to [place] with [facility] today who referred him to [employer] for assistance. Will have him enroll formally with [facility]. Dental release signed today.

## 2017-07-11 NOTE — BH Specialist Note (Signed)
Integrated Behavioral Health Initial Visit  MRN: [id number] Name: Gerald Lee  Number of Integrated Behavioral Health Clinician visits:: 1/6 Session Start time: 12:00 pm  Session End time: 12:18 pm Total time: 18 mins  Type of Service: Integrated Behavioral Health- Individual/Family Interpretor:No. Interpretor Name and Language: N/A   Warm Hand Off Completed.       SUBJECTIVE: Gerald Herterhomas A Onofre is a 21 y.o. male accompanied by Sibling (sister) Patient was referred by Rexene AlbertsStephanie Dixon due to admission for sucidal ideation at the end of Oct.  Patient reports the following symptoms/concerns: Patient reported that [facility] due to suicidal ideations and self-mutilation (cutting legs with knife).  Patient described the trigger as break-up of a romantic relationship, new HIV diagnosis and having gender identity issues.  Patient stated that he was prescribed Effexor, Trazadone, and Vistaril and stated that the Trazadone is working well for sleep.  Before this hospitalization the patient reported having a diagnosis of GAD.  Patient reported that upon discharge [facility] but is waiting for a psychiatric appointment there.  Additionally he stated that [facility].  Patient reported future-oriented worry and thinking about his HIV diagnosis daily and having thoughts of death due to HIV.  Patient reported feeling comfortable with his level of medical information about HIV but reported that he can get more knowledge.  Patient reported having support for his diagnosis after telling his mother, sister, father, aunt, and cousins.  Duration of problem: since September; Severity of problem: moderate  OBJECTIVE: Mood: Depressed and Affect: Appropriate Risk of harm to self or others: No plan to harm self or others  ASSESSMENT: Patient is currently experiencing difficulty adjusting to his  new HIV diagnosis and experiencing relationship stress and mood instability and may benefit from behavioral health services and medication management.  GOALS ADDRESSED: Patient will: 1. Reduce symptoms of: depression and stress 2. Increase knowledge and/or ability of: coping skills, healthy habits and stress reduction  3. Demonstrate ability to: Increase healthy adjustment to current life circumstances  INTERVENTIONS: Interventions utilized: Supportive Counseling and Psychoeducation and/or Health Education  Linked medical information and knowledge of antiretrovirals as a coping strategy to challenge automatic thoughts, to decrease thoughts about death due to HIV.   PLAN: 1. Behavioral recommendations: Attend [facility] at [facility].  If cannot attend call them to reschedule appointment. 2. Patient was provided business card and [facility] if additional assistance is needed.   Vergia AlbertsSherry Cyprian [place], [facility]

## 2017-07-12 LAB — BASIC METABOLIC PANEL
BUN: 10 mg/dL (ref 7–25)
CO2: 29 mmol/L (ref 20–32)
CREATININE: 0.97 mg/dL (ref 0.60–1.35)
Calcium: 9.6 mg/dL (ref 8.6–10.3)
Chloride: 102 mmol/L (ref 98–110)
Glucose, Bld: 103 mg/dL — ABNORMAL HIGH (ref 65–99)
Potassium: 4.2 mmol/L (ref 3.5–5.3)
Sodium: 138 mmol/L (ref 135–146)

## 2017-07-12 LAB — RPR: RPR Ser Ql: REACTIVE — AB

## 2017-07-12 LAB — FLUORESCENT TREPONEMAL AB(FTA)-IGG-BLD: Fluorescent Treponemal ABS: REACTIVE — AB

## 2017-07-12 LAB — RPR TITER

## 2017-07-13 LAB — HIV-1 RNA QUANT-NO REFLEX-BLD
HIV 1 RNA QUANT: NOT DETECTED {copies}/mL
HIV-1 RNA QUANT, LOG: NOT DETECTED {Log_copies}/mL

## 2017-07-13 MED FILL — BIKTARVY 50-200-25 MG TABS: 50-200-25 | 30 days supply | Qty: 30 | Fill #2

## 2017-07-22 ENCOUNTER — Encounter (HOSPITAL_COMMUNITY): Payer: Self-pay

## 2017-07-22 ENCOUNTER — Emergency Department (HOSPITAL_COMMUNITY)
Admission: EM | Admit: 2017-07-22 | Discharge: 2017-07-22 | Disposition: A | Discharge status: Home / Self Care | Payer: Medicaid Other | Attending: Emergency Medicine | Admitting: Emergency Medicine

## 2017-07-22 DIAGNOSIS — R310 Gross hematuria: Secondary | ICD-10-CM | POA: Diagnosis not present

## 2017-07-22 DIAGNOSIS — J45909 Unspecified asthma, uncomplicated: Secondary | ICD-10-CM | POA: Diagnosis not present

## 2017-07-22 DIAGNOSIS — Z21 Asymptomatic human immunodeficiency virus [HIV] infection status: Secondary | ICD-10-CM | POA: Insufficient documentation

## 2017-07-22 DIAGNOSIS — F17210 Nicotine dependence, cigarettes, uncomplicated: Secondary | ICD-10-CM | POA: Insufficient documentation

## 2017-07-22 DIAGNOSIS — Z79899 Other long term (current) drug therapy: Secondary | ICD-10-CM | POA: Diagnosis not present

## 2017-07-22 DIAGNOSIS — R319 Hematuria, unspecified: Secondary | ICD-10-CM | POA: Diagnosis present

## 2017-07-22 LAB — URINALYSIS, ROUTINE W REFLEX MICROSCOPIC
Bilirubin Urine: NEGATIVE
Glucose, UA: NEGATIVE mg/dL
HGB URINE DIPSTICK: NEGATIVE
Ketones, ur: NEGATIVE mg/dL
LEUKOCYTES UA: NEGATIVE
Nitrite: NEGATIVE
Protein, ur: NEGATIVE mg/dL
SPECIFIC GRAVITY, URINE: 1.029 (ref 1.005–1.030)
pH: 6 (ref 5.0–8.0)

## 2017-07-22 NOTE — ED Provider Notes (Signed)
[facility] Provider Note   CSN: [id number] Arrival date & time: 07/22/17  1328     History   Chief Complaint No chief complaint on file.   HPI Gerald Lee is a 21 y.o. male.  Patient with history of HIV, asthma, compliant with medications, no blood thinners presents after transient hematuria that resolved after 2 days. Patient was told he may get episodic from his medicines however want to be checked out. No fevers, no dysuria, no flank pain, no abdominal pain, no difficulty with urination. Patient has outpatient follow.no history of kidney disease.      Past Medical History:  Diagnosis Date  . Asthma   . Headache(784.0)   . HIV infection [facility](HCC)     Patient Active Problem List   Diagnosis Date Noted  . History of syphilis 07/11/2017  . 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  . HIV (human immunodeficiency virus infection) (HCC) 05/22/2017  . Healthcare maintenance 05/22/2017  . Migraine without aura 03/14/2014  . Episodic tension type headache 03/14/2014  . Obesity, unspecified 03/14/2014  . Insomnia, unspecified 03/14/2014    Past Surgical History:  Procedure Laterality Date  . CIRCUMCISION  1998  . HERNIA REPAIR     Done between the ages of 89 or 8 years   . NO PAST SURGERIES         Home Medications    Prior to Admission medications   Medication Sig Start Date End Date Taking? Authorizing Provider  bictegravir-emtricitabine-tenofovir AF (BIKTARVY) 50-200-25 MG TABS tablet Take 1 tablet by mouth daily. For HIV 06/28/17   Money, Gerlene Burdockravis B, FNP  hydrOXYzine (ATARAX/VISTARIL) 25 MG tablet Take 1 tablet (25 mg total) by mouth 3 (three) times daily as needed for anxiety. 06/27/17   Money, Gerlene Burdockravis B, FNP  traZODone (DESYREL) 100 MG tablet Take 1 tablet (100 mg total) by mouth at bedtime as needed for sleep. 06/27/17   Money, Gerlene Burdockravis B, FNP  venlafaxine XR (EFFEXOR-XR) 75 MG 24 hr capsule  Take 1 capsule (75 mg total) by mouth daily with breakfast. For mood control 06/28/17   Money, Gerlene Burdockravis B, FNP    Family History Family History  Problem Relation Age of Onset  . Stroke Mother   . Hypertension Mother   . Diabetes Father     Social History Social History   Tobacco Use  . Smoking status: Current Every Day Smoker    Types: Cigarettes  . Smokeless tobacco: Never Used  Substance Use Topics  . Alcohol use: Yes    Comment: occ  . Drug use: Yes    Types: Marijuana     Allergies   Patient has no known allergies.   Review of Systems Review of Systems  Constitutional: Negative for chills and fever.  HENT: Negative for congestion.   Eyes: Negative for visual disturbance.  Respiratory: Negative for shortness of breath.   Cardiovascular: Negative for chest pain.  Gastrointestinal: Negative for abdominal pain and vomiting.  Genitourinary: Positive for hematuria. Negative for dysuria and flank pain.  Musculoskeletal: Negative for back pain, neck pain and neck stiffness.  Skin: Negative for rash.  Neurological: Negative for light-headedness and headaches.     Physical Exam Updated Vital Signs BP (!) 138/56   Pulse 83   Temp 98.2 F (36.8 C) (Oral)   Resp 18   SpO2 98%   Physical Exam  Constitutional: He is oriented to person, place, and time. He appears well-developed and well-nourished.  HENT:  Head: Normocephalic and atraumatic.  Eyes: Conjunctivae are normal. Right eye exhibits no discharge. Left eye exhibits no discharge.  Neck: No tracheal deviation present.  Cardiovascular: Normal rate.  Pulmonary/Chest: Effort normal.  Abdominal: Soft. He exhibits no distension. There is no tenderness. There is no guarding.  Musculoskeletal: He exhibits no edema.  Neurological: He is alert and oriented to person, place, and time.  Skin: Skin is warm. No rash noted.  Psychiatric: He has a normal mood and affect.  Nursing note and vitals reviewed.    ED  Treatments / Results  Labs (all labs ordered are listed, but only abnormal results are displayed) Labs Reviewed  URINALYSIS, ROUTINE W REFLEX MICROSCOPIC - Abnormal; Notable for the following components:      Result Value   APPearance CLOUDY (*)    All other components within normal limits    EKG  EKG Interpretation None       Radiology No results found.  Procedures Procedures (including critical care time)  Medications Ordered in ED Medications - No data to display   Initial Impression / Assessment and Plan / ED Course  I have reviewed the triage vital signs and the nursing notes.  Pertinent labs & imaging results that were available during my care of the patient were reviewed by me and considered in my medical decision making (see chart for details).    Patient presents after transient asymptomatic hematuria. No symptoms currently. Patient has outpatient follow-up and reasons to return given. Urinalysis reviewed overall unremarkable.  Final Clinical Impressions(s) / ED Diagnoses   Final diagnoses:  Hematuria, gross    ED Discharge Orders    None       Blane OharaZavitz, Sheryll Dymek, MD 07/22/17 1544

## 2017-07-22 NOTE — Discharge Instructions (Signed)
Follow-up with her regular doctors. See a clinician if you are unable to urinate, flank pain, lgross blood in the urine persists, fevers, discomfort with urination or other concerns.

## 2017-07-22 NOTE — ED Triage Notes (Signed)
Patient complains of hematuria x 2 days, thinks related to his HIV meds, no pain, no distress. Alert and oriented

## 2017-07-28 ENCOUNTER — Encounter (HOSPITAL_COMMUNITY): Payer: Self-pay | Admitting: Nurse Practitioner

## 2017-07-28 ENCOUNTER — Emergency Department (HOSPITAL_COMMUNITY)
Admission: EM | Admit: 2017-07-28 | Discharge: 2017-07-29 | Disposition: A | Discharge status: Home / Self Care | Payer: Medicaid Other | Attending: Emergency Medicine | Admitting: Emergency Medicine

## 2017-07-28 DIAGNOSIS — J45909 Unspecified asthma, uncomplicated: Secondary | ICD-10-CM | POA: Diagnosis not present

## 2017-07-28 DIAGNOSIS — R42 Dizziness and giddiness: Secondary | ICD-10-CM | POA: Diagnosis not present

## 2017-07-28 DIAGNOSIS — F17210 Nicotine dependence, cigarettes, uncomplicated: Secondary | ICD-10-CM | POA: Diagnosis not present

## 2017-07-28 DIAGNOSIS — Z79899 Other long term (current) drug therapy: Secondary | ICD-10-CM | POA: Insufficient documentation

## 2017-07-28 LAB — BASIC METABOLIC PANEL
Anion gap: 7 (ref 5–15)
BUN: 13 mg/dL (ref 6–20)
CO2: 25 mmol/L (ref 22–32)
Calcium: 8.8 mg/dL — ABNORMAL LOW (ref 8.9–10.3)
Chloride: 102 mmol/L (ref 101–111)
Creatinine, Ser: 1.13 mg/dL (ref 0.61–1.24)
GFR calc Af Amer: >60 mL/min (ref >60)
GLUCOSE: 103 mg/dL — AB (ref 65–99)
POTASSIUM: 3.6 mmol/L (ref 3.5–5.1)
Sodium: 134 mmol/L — ABNORMAL LOW (ref 135–145)

## 2017-07-28 LAB — CBC
HCT: 44.8 % (ref 39.0–52.0)
Hemoglobin: 14.9 g/dL (ref 13.0–17.0)
MCH: 28.4 pg (ref 26.0–34.0)
MCHC: 33.3 g/dL (ref 30.0–36.0)
MCV: 85.5 fL (ref 78.0–100.0)
Platelets: 189 10*3/uL (ref 150–400)
RBC: 5.24 MIL/uL (ref 4.22–5.81)
RDW: 15 % (ref 11.5–15.5)
WBC: 8.4 10*3/uL (ref 4.0–10.5)

## 2017-07-28 LAB — I-STAT TROPONIN, ED: Troponin i, poc: 0 ng/mL (ref 0.00–0.08)

## 2017-07-28 MED ORDER — SODIUM CHLORIDE 0.9 % IV BOLUS (SEPSIS)
1000.0000 mL | Freq: Once | INTRAVENOUS | Status: AC
  Administered 2017-07-28: 1000 mL via INTRAVENOUS

## 2017-07-28 NOTE — ED Provider Notes (Signed)
[facility]-EMERGENCY DEPT Provider Note   CSN: [id number] Arrival date & time: 07/28/17  2038     History   Chief Complaint No chief complaint on file.   HPI Gerald Lee is a 21 y.o. male.  Patient presents to the emergency department with chief complaint of lightheadedness and dizziness.  Patient reports that he has had these symptoms for many years.  Over the past week he feels that his symptoms have been worse.  He states that the symptoms are particularly bad when going from sitting to standing.  He states that he feels like he is going to pass out.  He has been evaluated by his PCP for the same, but has not had a diagnosis made.  States that he was more concerned about this episode because he is living alone now.   The history is provided by the patient. No language interpreter was used.    Past Medical History:  Diagnosis Date  . Asthma   . Headache(784.0)   . HIV infection [facility](HCC)     Patient Active Problem List   Diagnosis Date Noted  . History of syphilis 07/11/2017  . 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  . HIV (human immunodeficiency virus infection) (HCC) 05/22/2017  . Healthcare maintenance 05/22/2017  . Migraine without aura 03/14/2014  . Episodic tension type headache 03/14/2014  . Obesity, unspecified 03/14/2014  . Insomnia, unspecified 03/14/2014    Past Surgical History:  Procedure Laterality Date  . CIRCUMCISION  1998  . HERNIA REPAIR     Done between the ages of 89 or 8 years   . NO PAST SURGERIES         Home Medications    Prior to Admission medications   Medication Sig Start Date End Date Taking? Authorizing Provider  bictegravir-emtricitabine-tenofovir AF (BIKTARVY) 50-200-25 MG TABS tablet Take 1 tablet by mouth daily. For HIV 06/28/17   Money, Gerlene Burdockravis B, FNP  hydrOXYzine (ATARAX/VISTARIL) 25 MG tablet Take 1 tablet (25 mg total) by mouth 3 (three) times daily as needed for  anxiety. 06/27/17   Money, Gerlene Burdockravis B, FNP  traZODone (DESYREL) 100 MG tablet Take 1 tablet (100 mg total) by mouth at bedtime as needed for sleep. 06/27/17   Money, Gerlene Burdockravis B, FNP  venlafaxine XR (EFFEXOR-XR) 75 MG 24 hr capsule Take 1 capsule (75 mg total) by mouth daily with breakfast. For mood control 06/28/17   Money, Gerlene Burdockravis B, FNP    Family History Family History  Problem Relation Age of Onset  . Stroke Mother   . Hypertension Mother   . Diabetes Father     Social History Social History   Tobacco Use  . Smoking status: Current Every Day Smoker    Types: Cigarettes  . Smokeless tobacco: Never Used  Substance Use Topics  . Alcohol use: Yes    Comment: occ  . Drug use: Yes    Types: Marijuana     Allergies   Patient has no known allergies.   Review of Systems Review of Systems  All other systems reviewed and are negative.    Physical Exam Updated Vital Signs BP 120/78 (BP Location: Left Arm)   Pulse 79   Temp 98.4 F (36.9 C) (Oral)   Resp 12   Ht 6\' 4"  (1.93 m)   Wt 111.1 kg (245 lb)   SpO2 100%   BMI 29.82 kg/m   Physical Exam  Constitutional: He is oriented to person, place, and time.  He appears well-developed and well-nourished.  HENT:  Head: Normocephalic and atraumatic.  Eyes: Conjunctivae and EOM are normal. Pupils are equal, round, and reactive to light. Right eye exhibits no discharge. Left eye exhibits no discharge. No scleral icterus.  Neck: Normal range of motion. Neck supple. No JVD present.  Cardiovascular: Normal rate, regular rhythm and normal heart sounds. Exam reveals no gallop and no friction rub.  No murmur heard. Pulmonary/Chest: Effort normal and breath sounds normal. No respiratory distress. He has no wheezes. He has no rales. He exhibits no tenderness.  Abdominal: Soft. He exhibits no distension and no mass. There is no tenderness. There is no rebound and no guarding.  Musculoskeletal: Normal range of motion. He exhibits no edema or  tenderness.  Neurological: He is alert and oriented to person, place, and time.  Skin: Skin is warm and dry.  Psychiatric: He has a normal mood and affect. His behavior is normal. Judgment and thought content normal.  Nursing note and vitals reviewed.    ED Treatments / Results  Labs (all labs ordered are listed, but only abnormal results are displayed) Labs Reviewed  BASIC METABOLIC PANEL - Abnormal; Notable for the following components:      Result Value   Sodium 134 (*)    Glucose, Bld 103 (*)    Calcium 8.8 (*)    All other components within normal limits  CBC  I-STAT TROPONIN, ED    EKG  EKG Interpretation None       Radiology No results found.  Procedures Procedures (including critical care time)  Medications Ordered in ED Medications  sodium chloride 0.9 % bolus 1,000 mL (1,000 mLs Intravenous New Bag/Given 07/28/17 2354)     Initial Impression / Assessment and Plan / ED Course  I have reviewed the triage vital signs and the nursing notes.  Pertinent labs & imaging results that were available during my care of the patient were reviewed by me and considered in my medical decision making (see chart for details).     Patient with lightheadedness and dizziness.  This is not a new problem for the patient.  He states the symptoms are worsened with position changes.  He is noted to be mildly orthostatic in the ED tonight.  We will give a liter of fluids, and will reassess.  Laboratory workup is reassuring.  No evidence of concerning arrhythmias.  EKG shows diffuse ST elevations, which could be pericarditis versus early repolarization.  Patient denies any pain in his chest.  Denies any positional pain.  Denies recent illness.  Patient feels improved after fluids.  Comfortable with discharge to home.  Recommend PCP follow-up.  Final Clinical Impressions(s) / ED Diagnoses   Final diagnoses:  Lightheadedness    ED Discharge Orders    None       Roxy HorsemanBrowning,  Mistina Coatney, PA-C 07/29/17 0115    Devoria AlbeKnapp, Iva, MD 07/29/17 [phone]

## 2017-07-28 NOTE — ED Triage Notes (Signed)
Pt states he is progressively having lightheadedness and headaches. Requesting further evaluation.

## 2017-07-28 NOTE — ED Notes (Signed)
ED Provider at bedside. 

## 2017-08-14 ENCOUNTER — Other Ambulatory Visit: Payer: Self-pay

## 2017-08-14 ENCOUNTER — Encounter (HOSPITAL_COMMUNITY): Payer: Self-pay | Admitting: Emergency Medicine

## 2017-08-14 ENCOUNTER — Ambulatory Visit (HOSPITAL_COMMUNITY)
Admission: EM | Admit: 2017-08-14 | Discharge: 2017-08-14 | Disposition: A | Discharge status: Home / Self Care | Payer: Medicaid Other | Attending: Urgent Care | Admitting: Urgent Care

## 2017-08-14 DIAGNOSIS — Z21 Asymptomatic human immunodeficiency virus [HIV] infection status: Secondary | ICD-10-CM

## 2017-08-14 DIAGNOSIS — M791 Myalgia, unspecified site: Secondary | ICD-10-CM

## 2017-08-14 DIAGNOSIS — R52 Pain, unspecified: Secondary | ICD-10-CM | POA: Diagnosis not present

## 2017-08-14 DIAGNOSIS — A539 Syphilis, unspecified: Secondary | ICD-10-CM

## 2017-08-14 DIAGNOSIS — R6883 Chills (without fever): Secondary | ICD-10-CM | POA: Diagnosis not present

## 2017-08-14 DIAGNOSIS — B20 Human immunodeficiency virus [HIV] disease: Secondary | ICD-10-CM

## 2017-08-14 MED ORDER — KETOROLAC TROMETHAMINE 60 MG/2ML IM SOLN
60.0000 mg | Freq: Once | INTRAMUSCULAR | Status: AC
  Administered 2017-08-14: 60 mg via INTRAMUSCULAR

## 2017-08-14 MED ORDER — KETOROLAC TROMETHAMINE 60 MG/2ML IM SOLN
INTRAMUSCULAR | Status: AC
Start: 2017-08-14 — End: ?
  Filled 2017-08-14: qty 2

## 2017-08-14 NOTE — ED Triage Notes (Signed)
Pt states "ive been breaking fevers for the last two nights, woke up in a sweat in the bed, I also feel queasy. My back and my legs are hurting, it feels like when I was first diagnosed with HIV."

## 2017-08-14 NOTE — Discharge Instructions (Signed)
Check with Dr. Durwin Noraixon tomorrow about taking Tylenol for your body aches. In the meantime, hydrate well and rest.

## 2017-08-14 NOTE — ED Provider Notes (Signed)
  MRN: [id number] DOB: December 22, 1995  Subjective:   Gerald Lee is a 21 y.o. male presenting for 2 day history of subjective fever, chills, sweats, body aches (legs and low back). Has tried hydrating well, resting. He has not taken ibuprofen due to excessive use of this. Patient is being managed with ID clinic, Dr. Durwin Noraixon. His last cbc, bmet were unremarkable on 07/28/2017. HIV virus not detected 07/11/2017. Last treponemal ABS was reactive, RPR titer of 1:16 on 07/11/2017, was advised that he did not need treatment. Denies sinus pain, ear pain, throat pain, cough, chest pain, n/v, abdominal pain, rashes. He has an appt with Dr. Durwin Noraixon tomorrow. Has been advised not to take Tylenol or ibuprofen despite having normal liver enzymes and kidney function.  Gerald Lee has No Known Allergies.  Gerald Lee  has a past medical history of Asthma, Headache(784.0), and HIV infection (HCC). Also  has a past surgical history that includes Hernia repair; Circumcision (1998); and No past surgeries.  Objective:   Vitals: BP (!) 130/52   Pulse 86   Temp 98.6 F (37 C)   Resp 16   SpO2 100%   Physical Exam  Constitutional: He is oriented to person, place, and time. He appears well-developed and well-nourished.  HENT:  Mouth/Throat: Oropharynx is clear and moist.  Eyes: No scleral icterus.  Cardiovascular: Normal rate, regular rhythm and intact distal pulses. Exam reveals no gallop and no friction rub.  No murmur heard. Pulmonary/Chest: No respiratory distress. He has no wheezes. He has no rales.  Neurological: He is alert and oriented to person, place, and time.  Skin: Skin is warm and dry. No rash noted.  Psychiatric: He has a normal mood and affect.   Assessment and Plan :   Body aches  Chills  HIV disease (HCC)  Syphilis   Suspect that patient is undergoing viral syndrome. Recommended supportive care, check with PCP, Dr. Durwin Noraixon tomorrow.    Wallis BambergMani, Tifany Hirsch, [place] JerseyPA-C 08/14/17 1630

## 2017-08-15 ENCOUNTER — Ambulatory Visit
Admission: RE | Admit: 2017-08-15 | Discharge: 2017-08-15 | Disposition: A | Discharge status: Home / Self Care | Payer: Medicaid Other | Source: Ambulatory Visit | Attending: Infectious Diseases | Admitting: Infectious Diseases

## 2017-08-15 ENCOUNTER — Ambulatory Visit (INDEPENDENT_AMBULATORY_CARE_PROVIDER_SITE_OTHER): Payer: Medicaid Other | Admitting: Infectious Diseases

## 2017-08-15 ENCOUNTER — Encounter: Payer: Self-pay | Admitting: Infectious Diseases

## 2017-08-15 VITALS — BP 99/63 | HR 89 | Temp 99.1°F | Ht 72.0 in | Wt 253.0 lb

## 2017-08-15 DIAGNOSIS — B20 Human immunodeficiency virus [HIV] disease: Secondary | ICD-10-CM | POA: Diagnosis not present

## 2017-08-15 DIAGNOSIS — R509 Fever, unspecified: Secondary | ICD-10-CM | POA: Diagnosis present

## 2017-08-15 DIAGNOSIS — Z8619 Personal history of other infectious and parasitic diseases: Secondary | ICD-10-CM

## 2017-08-15 DIAGNOSIS — M546 Pain in thoracic spine: Secondary | ICD-10-CM

## 2017-08-15 DIAGNOSIS — Z0000 Encounter for general adult medical examination without abnormal findings: Secondary | ICD-10-CM | POA: Diagnosis not present

## 2017-08-15 LAB — CBC WITH DIFFERENTIAL/PLATELET
Basophils Absolute: 56 cells/uL (ref 0–200)
Basophils Relative: 0.6 %
EOS PCT: 1.5 %
Eosinophils Absolute: 140 cells/uL (ref 15–500)
HEMATOCRIT: 42.1 % (ref 38.5–50.0)
HEMOGLOBIN: 14.1 g/dL (ref 13.2–17.1)
Lymphs Abs: 2632 cells/uL (ref 850–3900)
MCH: 27.7 pg (ref 27.0–33.0)
MCHC: 33.5 g/dL (ref 32.0–36.0)
MCV: 82.7 fL (ref 80.0–100.0)
MPV: 10.4 fL (ref 7.5–12.5)
Monocytes Relative: 9.3 %
NEUTROS PCT: 60.3 %
Neutro Abs: 5608 cells/uL (ref 1500–7800)
Platelets: 236 10*3/uL (ref 140–400)
RBC: 5.09 10*6/uL (ref 4.20–5.80)
RDW: 14 % (ref 11.0–15.0)
Total Lymphocyte: 28.3 %
WBC mixed population: 865 cells/uL (ref 200–950)
WBC: 9.3 10*3/uL (ref 3.8–10.8)

## 2017-08-15 NOTE — Progress Notes (Signed)
Gerald Lee  June 13, 1996 [id number] PCP: Fleet ContrasAvbuere, Edwin, MD   Reason for Visit: Sick Visit   Brief Narrative: Gerald Lee is a 21 y.o. AA male with HIV infection. Diagnosed in ER September 2018 with CD4 nadir 780. Started immediately on Biktarvy. Also diagnosed with syphilis at the same time. Was assessed for neurosyphilis with severe headaches but LP negative. High Risk: MSM. Previous OIs: none Genotype: sensitive   Patient Active Problem List   Diagnosis Date Noted  . Back pain, acute 08/16/2017  . Fever 08/16/2017  . History of syphilis 07/11/2017  . 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  . HIV (human immunodeficiency virus infection) (HCC) 05/22/2017  . Healthcare maintenance 05/22/2017  . Migraine without aura 03/14/2014  . Obesity, unspecified 03/14/2014  . Insomnia, unspecified 03/14/2014   HPI:  HIV = states he has had excellent adherence to his Biktarvy. Reports no missed doses since we saw him last. He was virologically suppressed at last venipuncture. Last CD4 770. His mother is with him today and is asking for education about HIV prognosis.   Back Pain / Fevers = this is a new problem that started suddenly Saturday. He has also had associated fatigue, decreased appetite, subjective fevers, chills and night sweats x 2 days. These fevers have abated with tylenol PO and Ketorolac he received in ED. No known mechanism of injury to explain this. [facility] at the advice of our team for assessment for possible influenza, however they did not check this for him. ED visit ~2 weeks prior with complaints of hematuria - this was noted x 2-3 days and resolved spontaneously; was told it may be r/t his Bitarvy. He denies any URI symptoms such as cough, nasal congestion, rhinorrhea, SOB, sputum formation, sinus pressure/pain. He has had 'walking pneumonia' in the past and concerned about this being cause.   History of Syphilis = 04/2017 with  macular rash and RPR 1:64. LP done to r/o neurosyphilis with complaints of severe headaches and black out spells - negative. Tx with Bicillin. FU RPR 07/11/17 with appropriate 4-fold drop in RPR 1:16. He was told by urgent care provider that this could be the cause of some of his symptoms and he is concerned this is the case.   Review of Systems  Constitutional: Positive for chills, diaphoresis and malaise/fatigue. Negative for fever and weight loss.  HENT: Negative for congestion, ear pain, sinus pain and sore throat.        No dental problems  Eyes: Negative for blurred vision, discharge and redness.  Respiratory: Negative for cough and sputum production.   Cardiovascular: Negative for chest pain and leg swelling.  Gastrointestinal: Negative for abdominal pain, diarrhea and vomiting.  Genitourinary: Negative for dysuria and flank pain.  Musculoskeletal: Positive for back pain. Negative for joint pain, myalgias and neck pain.  Skin: Negative for rash.  Neurological: Positive for headaches. Negative for dizziness and tingling.  Psychiatric/Behavioral: Negative for depression and substance abuse. The patient is not nervous/anxious and does not have insomnia.     Past Medical History:  Diagnosis Date  . Asthma   . Headache(784.0)   . HIV infection (HCC)     Social History   Tobacco Use  . Smoking status: Current Every Day Smoker    Types: Cigarettes  . Smokeless tobacco: Never Used  Substance Use Topics  . Alcohol use: Yes    Comment: occ  . Drug use: Yes    Types: Marijuana  Family History  Problem Relation Age of Onset  . Stroke Mother   . Hypertension Mother   . Diabetes Father     No Known Allergies  Objective:  Vitals:   08/15/17 1017  BP: 99/63  Pulse: 89  Temp: 99.1 F (37.3 C)  TempSrc: Oral  Weight: 253 lb (114.8 kg)  Height: 6' (1.829 m)   Body mass index is 34.31 kg/m.  Physical Exam  Constitutional: He is oriented to person, place, and time.    Mother is present today. He is lying on exam table and appears fatigued/uncomfortable.   HENT:  Right Ear: External ear normal.  Left Ear: External ear normal.  Nose: Nose normal.  Mouth/Throat: Oropharynx is clear and moist. No oral lesions. Normal dentition. No dental caries.  Eyes: Pupils are equal, round, and reactive to light. No scleral icterus.  Cardiovascular: Normal rate, regular rhythm and normal heart sounds.  Pulmonary/Chest: Effort normal. He has rales (right posterior lung field).  Abdominal: Soft. Bowel sounds are normal. He exhibits no distension. There is no tenderness.  Musculoskeletal:       Lumbar back: He exhibits decreased range of motion and tenderness. He exhibits no edema and no deformity.  Pain along spinous muscles over lower thoracic/upper lumbar region.   Lymphadenopathy:    He has no cervical adenopathy.  Neurological: He is alert and oriented to person, place, and time.  Skin: Skin is warm and dry. No rash noted.  Psychiatric: Memory, affect and judgment normal. His mood appears not anxious. He does not exhibit a depressed mood.  + CVA tenderness with percussion   Lab Results Lab Results  Component Value Date   WBC 9.3 08/15/2017   HGB 14.1 08/15/2017   HCT 42.1 08/15/2017   MCV 82.7 08/15/2017   PLT 236 08/15/2017    Lab Results  Component Value Date   CREATININE 1.13 07/28/2017   BUN 13 07/28/2017   NA 134 (L) 07/28/2017   K 3.6 07/28/2017   CL 102 07/28/2017   CO2 25 07/28/2017    Lab Results  Component Value Date   ALT 15 (L) 06/20/2017   AST 19 06/20/2017   ALKPHOS 89 06/20/2017   BILITOT 1.2 06/20/2017    Lab Results  Component Value Date   CHOL 122 05/22/2017   HDL 21 (L) 05/22/2017   TRIG 86 05/22/2017   CHOLHDL 5.8 (H) 05/22/2017   HIV 1 RNA Quant  Date Value  07/11/2017 <20 NOT DETECTED copies/mL  05/22/2017 10,400 Copies/mL (H)   CD4 T Cell Abs (/uL)  Date Value  05/17/2017 770   No results found for: HAV Lab  Results  Component Value Date   HEPBSAG NON-REACTIVE 05/22/2017   HEPBSAB NON-REACTIVE 05/22/2017   No results found for: HCVAB Lab Results  Component Value Date   CHLAMYDIAWP Negative 05/22/2017   CHLAMYDIAWP **POSITIVE** (A) 05/22/2017   N Negative 05/22/2017   N Negative 05/22/2017   No results found for: GCPROBEAPT No results found for: QUANTGOLD No results found for: RPR    Problem List Items Addressed This Visit      Other   Back pain, acute    Differential includes musculoskeletal strain, kidney stone, pneumonia or atypical prostatitis (hx chlamydia recently). Although he appears on exam to have a musculoskeletal issue there is no identifiable trauma to explain and fevers would not match this either.   Will check CBC and U/A to assess for signs of kidney stones (history of recent hematuria and +  CVA tenderness).   Check CXR with suspected rales over RLL. Advised short term consistent ibuprofen use 600 - 800 mg TID x 5 days to see if this helps with pain.   If worsens or persists will place order for abdominal/pelvis CT w/o contrast to assess GU system for kidney/ureter stones / prostate.       Relevant Orders   Urinalysis, Routine w reflex microscopic   Fever - Primary   Relevant Orders   DG Chest 2 View (Completed)   CBC with Differential/Platelet (Completed)   Urinalysis, Routine w reflex microscopic   Healthcare maintenance    Received influenza and pneumococcal vaccines. He will need HPV series and Menveo at upcoming appointments.       History of syphilis    Appropriate drop in RPR. No active symptoms concerning for reinfection. Will continue to assess seroresponse at next appointment. I do not believe this is contributing to current symptoms. Safe sex counseling provided. Educated on potential for re-infection.       HIV (human immunodeficiency virus infection) (HCC)    Suppressed at last lab draw after 1.5 months on Biktarvy. Continued HIV education to  reinforce adherence. Congratulated on quick suppression. Will check VL again today for reinforcement.   Will loose medicaid at his 22st birthday upcoming September 28, 2017. Discussed he will need to begin RW/ADAP enrollment with [facility] case worker or [facility] financial team to avoid lapse in medications and plan on bringing in ID and proof of income. He will return to clinic in 2 months.       Relevant Orders   HIV 1 RNA quant-no reflex-bld     Rexene AlbertsStephanie Yitzhak Awan, MSN, NP-C [facility] Pager: [phone]  08/16/17 9:44 AM

## 2017-08-15 NOTE — Patient Instructions (Addendum)
Drink lots of water to make sure your urine is pale yellow to clear.   OK to take Ibuprofen 600 - 800 mg up to 3 times a day for back pain short term (5 days).   If your back pain is not better by the end of the week or if it worsens we should consider getting your worked up for kidney stones.   Will check your routine labs today along with your urine and a chest x ray  Please make an appointment to see our financial team at the end of January to start your paperwork for [employer] and [employer] [place]. You will need to bring a months worth of pay stubs and an ID to this appointment.   Back to see Judeth CornfieldStephanie for routine follow up care in 3 months.

## 2017-08-16 ENCOUNTER — Telehealth: Payer: Self-pay | Admitting: *Deleted

## 2017-08-16 DIAGNOSIS — R509 Fever, unspecified: Secondary | ICD-10-CM | POA: Insufficient documentation

## 2017-08-16 DIAGNOSIS — M549 Dorsalgia, unspecified: Secondary | ICD-10-CM | POA: Insufficient documentation

## 2017-08-16 NOTE — Assessment & Plan Note (Signed)
Appropriate drop in RPR. No active symptoms concerning for reinfection. Will continue to assess seroresponse at next appointment. I do not believe this is contributing to current symptoms. Safe sex counseling provided. Educated on potential for re-infection.

## 2017-08-16 NOTE — Assessment & Plan Note (Signed)
Differential includes musculoskeletal strain, kidney stone, pneumonia or atypical prostatitis (hx chlamydia recently). Although he appears on exam to have a musculoskeletal issue there is no identifiable trauma to explain and fevers would not match this either.   Will check CBC and U/A to assess for signs of kidney stones (history of recent hematuria and + CVA tenderness).   Check CXR with suspected rales over RLL. Advised short term consistent ibuprofen use 600 - 800 mg TID x 5 days to see if this helps with pain.   If worsens or persists will place order for abdominal/pelvis CT w/o contrast to assess GU system for kidney/ureter stones / prostate.

## 2017-08-16 NOTE — Telephone Encounter (Signed)
Patient advised of Judeth CornfieldStephanie NP's advice and he agreed with this plan. Wendall MolaJacqueline Cockerham

## 2017-08-16 NOTE — Assessment & Plan Note (Addendum)
Suppressed at last lab draw after 1.5 months on Biktarvy. Continued HIV education to reinforce adherence. Congratulated on quick suppression. Will check VL again today for reinforcement.   Will loose medicaid at his 22st birthday upcoming September 28, 2017. Discussed he will need to begin RW/ADAP enrollment with [facility] case worker or [facility] financial team to avoid lapse in medications and plan on bringing in ID and proof of income. He will return to clinic in 2 months.

## 2017-08-16 NOTE — Assessment & Plan Note (Signed)
Received influenza and pneumococcal vaccines. He will need HPV series and Menveo at upcoming appointments.

## 2017-08-16 NOTE — Telephone Encounter (Signed)
-----   Message from Blanchard KelchStephanie N Dixon, NP sent at 08/16/2017 10:37 AM EST ----- Please call Gerald Lee and inform him that his chest x ray was normal and not concerning for pneumonia.  Would advised him to get a thermometer and keep a log of his temperatures this week to see if he has any measured fevers. Otherwise, rest, ibuprofen 3-4 tabs up to three times a day for his aches/pains, lots of fluids to see if he improves.

## 2017-08-17 ENCOUNTER — Telehealth: Payer: Self-pay | Admitting: *Deleted

## 2017-08-17 DIAGNOSIS — R109 Unspecified abdominal pain: Secondary | ICD-10-CM

## 2017-08-17 LAB — HIV-1 RNA QUANT-NO REFLEX-BLD
HIV 1 RNA QUANT: NOT DETECTED {copies}/mL
HIV-1 RNA QUANT, LOG: NOT DETECTED {Log_copies}/mL

## 2017-08-17 NOTE — Addendum Note (Signed)
Addended by: Blanchard KelchIXON, Valisa Karpel N on: 08/17/2017 07:27 PM   Modules accepted: Orders

## 2017-08-17 NOTE — Telephone Encounter (Signed)
Order placed for stat abd/plevis CT without contrast as this is better study when considering possible stones.   I would also still like for him to come by the clinic to check his urine - apparently he was unable to produce a sample earlier this week.   I will call patient in AM if you can let [name] know about stat referral please. Thank you.

## 2017-08-17 NOTE — Telephone Encounter (Signed)
Patient called and left a voice mail stating he would like a call from NP and he wants the ultrasound that was discussed. He stated he is still having back pain.

## 2017-08-18 ENCOUNTER — Other Ambulatory Visit: Payer: Self-pay | Admitting: Infectious Diseases

## 2017-08-18 ENCOUNTER — Telehealth: Payer: Self-pay | Admitting: *Deleted

## 2017-08-18 ENCOUNTER — Ambulatory Visit (HOSPITAL_COMMUNITY)
Admission: RE | Admit: 2017-08-18 | Discharge: 2017-08-18 | Disposition: A | Discharge status: Home / Self Care | Payer: Medicaid Other | Source: Ambulatory Visit | Attending: Infectious Diseases | Admitting: Infectious Diseases

## 2017-08-18 DIAGNOSIS — R109 Unspecified abdominal pain: Secondary | ICD-10-CM | POA: Diagnosis present

## 2017-08-18 DIAGNOSIS — K639 Disease of intestine, unspecified: Secondary | ICD-10-CM | POA: Diagnosis not present

## 2017-08-18 MED ORDER — DOXYCYCLINE HYCLATE 100 MG PO TABS: 100.0000 mg | ORAL_TABLET | Freq: Two times a day (BID) | ORAL | 0 refills | Status: AC

## 2017-08-18 NOTE — Telephone Encounter (Signed)
Per Rexene AlbertsStephanie Dixon, NP [facility] CT scan. He is aware nothing to eat or drink. Carmon GinsbergAshley Rhenner, referral coordinator has approved this test through AT[employer]patient's insurance, Medicaid.

## 2017-08-18 NOTE — Telephone Encounter (Signed)
Done

## 2017-08-19 ENCOUNTER — Telehealth: Payer: Self-pay | Admitting: Infectious Diseases

## 2017-08-19 NOTE — Telephone Encounter (Signed)
Pt called back.  I explained his CT to him.  He is on biktarvy, antidepressant.   I gave him the choice of PO anbx or ED eval/IV anbx.  He chose the po route.  I asked him to call back if he has any problems He asks for work note, he can get this from clinic on 12-26

## 2017-08-19 NOTE — Telephone Encounter (Signed)
Called with report of CT scan  Attempted to call pt- left VM Pt needs flex sig, visit

## 2017-08-23 ENCOUNTER — Telehealth: Payer: Self-pay | Admitting: *Deleted

## 2017-08-23 NOTE — Telephone Encounter (Signed)
Patient informed and he will make his 5 week nurse visit the day he comes in to see ParmaStephanie on 09/11/17. Wendall MolaJacqueline Beverlie Kurihara CMA

## 2017-08-23 NOTE — Telephone Encounter (Signed)
-----   Message from Blanchard KelchStephanie N Dixon, NP sent at 08/18/2017  4:53 PM EST ----- Please call the patient to let Gerald Lee know no kidney stones were seen on CT scan. However he does have some inflammation noted in his colon/rectum. I worry these changes are due to either under-treated chlamydia (had this recently) or re-infection. I have sent in a prescription for Doxycycline he will take twice a day with food for 3 weeks to treat Gerald Lee. He needs to have follow up rectal swab 2 weeks after treatment please.   Please abstain from any sexual intercourse (oral or rectal) until we figure out what's going on with Gerald Lee.

## 2017-08-24 ENCOUNTER — Ambulatory Visit: Payer: Self-pay | Admitting: Infectious Diseases

## 2017-08-25 ENCOUNTER — Encounter: Payer: Self-pay | Admitting: *Deleted

## 2017-08-25 ENCOUNTER — Telehealth: Payer: Self-pay | Admitting: *Deleted

## 2017-08-25 MED ORDER — ONDANSETRON HCL 4 MG PO TABS: 4.0000 mg | ORAL_TABLET | Freq: Two times a day (BID) | ORAL | 0 refills | Status: DC | PRN

## 2017-08-25 NOTE — Telephone Encounter (Signed)
Attempted to call [name] back however mailbox is full.   Would like for him to stop taking the Augmentin and only Doxycycline. Please make sure he is taking this with food to help with nausea.   I will send him in zofran BID for nausea to be taken 30 minutes before antibiotic. He is not considered to be infectious and able to return to work.

## 2017-08-25 NOTE — Telephone Encounter (Signed)
I also was unable to reach patient. No answer and voice mail is full. Gerald MolaJacqueline Dmetrius Ambs

## 2017-08-25 NOTE — Addendum Note (Signed)
Addended by: Blanchard KelchIXON, STEPHANIE N on: 08/25/2017 12:26 PM   Modules accepted: Orders

## 2017-08-25 NOTE — Telephone Encounter (Signed)
Patient walked into clinic for a signed letter from provider. He then had questions regarding nausea medicine. Advised patient that Judeth CornfieldStephanie is in clinic seeing patients and I would ask her to call him when she finishes her morning clinic. Wendall MolaJacqueline Ladine Kiper

## 2017-08-31 MED FILL — BIKTARVY 50-200-25 MG TABS: 50-200-25 | 30 days supply | Qty: 30 | Fill #3

## 2017-08-31 NOTE — Telephone Encounter (Signed)
Patient returned calls from clinic on 12/28. RN relayed advice, information. Patient verbalized understanding, confirmed upcoming appointment. Andree CossHowell, Chaise Mahabir M, RN

## 2017-09-11 ENCOUNTER — Other Ambulatory Visit: Payer: Self-pay

## 2017-09-11 ENCOUNTER — Other Ambulatory Visit (HOSPITAL_COMMUNITY)
Admission: RE | Admit: 2017-09-11 | Discharge: 2017-09-11 | Disposition: A | Discharge status: Home / Self Care | Payer: Medicaid Other | Source: Ambulatory Visit | Attending: Infectious Diseases | Admitting: Infectious Diseases

## 2017-09-11 ENCOUNTER — Encounter: Payer: Self-pay | Admitting: Infectious Diseases

## 2017-09-11 ENCOUNTER — Ambulatory Visit (INDEPENDENT_AMBULATORY_CARE_PROVIDER_SITE_OTHER): Payer: Medicaid Other | Admitting: Infectious Diseases

## 2017-09-11 VITALS — BP 140/79 | HR 78 | Temp 98.0°F | Ht 76.0 in | Wt 251.0 lb

## 2017-09-11 DIAGNOSIS — Z23 Encounter for immunization: Secondary | ICD-10-CM

## 2017-09-11 DIAGNOSIS — Z21 Asymptomatic human immunodeficiency virus [HIV] infection status: Secondary | ICD-10-CM

## 2017-09-11 DIAGNOSIS — Z8619 Personal history of other infectious and parasitic diseases: Secondary | ICD-10-CM

## 2017-09-11 DIAGNOSIS — Z0000 Encounter for general adult medical examination without abnormal findings: Secondary | ICD-10-CM

## 2017-09-11 DIAGNOSIS — B20 Human immunodeficiency virus [HIV] disease: Secondary | ICD-10-CM

## 2017-09-11 DIAGNOSIS — K529 Noninfective gastroenteritis and colitis, unspecified: Secondary | ICD-10-CM | POA: Diagnosis not present

## 2017-09-11 NOTE — Patient Instructions (Addendum)
Continue taking your Doxycyline until you are done.   Will give you your 2nd Hepatitis B vaccine today. #3 at your upcoming visit.   Will start your HPV vaccines today   Please return in 3 months to see Gerald Lee again with lab and nurse visit 2 weeks before to get your #2 HPV.   You look great!

## 2017-09-11 NOTE — Assessment & Plan Note (Signed)
Gerald Lee is 100% improved regarding back pain, myalgias, malaise and fevers since starting on Doxycycline. 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 He is nearly complete with his Doxycycline and I have instructed him to continue with this until complete. Will repeat rectal swab today to ensure cure from previous chlamydia infection.

## 2017-09-11 NOTE — Progress Notes (Signed)
Gerald Lee  08/13/1996 [id number] PCP: Fleet ContrasAvbuere, Edwin, MD   Reason for Visit: Sick Visit   Brief Narrative: Gerald Lee is a 22 y.o. AA male with HIV infection. Diagnosed September 2018 with CD4 nadir 780. Started immediately on Biktarvy. Also diagnosed with syphilis at the same time. Was assessed for neurosyphilis with severe headaches but LP negative. High Risk: MSM. Previous OIs: none Genotype: 02/2017 - sensitive   Patient Active Problem List   Diagnosis Date Noted  . History of syphilis 07/11/2017  . 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  . HIV (human immunodeficiency virus infection) (HCC) 05/22/2017  . Healthcare maintenance 05/22/2017  . Migraine without aura 03/14/2014  . Obesity, unspecified 03/14/2014  . Insomnia, unspecified 03/14/2014   HPI:  Gerald Lee is feeling "100% better" since his last visit with me. He is nearly done with his doxycline and his back pain, fevers and body aches are gone. He has done very well with his Biktarvy and has not missed any doses since last visit. He is excited to tell me he is getting his own place and a car very soon. He is also excited he will be seen in the dental clinic soon. He has returned to work full time and has tolerated this well. Reports no complaints today suggestive of associated opportunistic infection or advancing HIV disease such as fevers, night sweats, weight loss, anorexia, cough, SOB, nausea, vomiting, diarrhea, headache, sensory changes, lymphadenopathy or oral thrush. He will lost his Medicaid at the end of January with his 22st birthday and would like to know what he needs to do for next steps regarding paying for his HIV care/meds.   His depression is under excellent control and is very happy with current doses of medications. Only has infrequent episodes of sadness but has a great support system and "supportive group of women" that help with these moments. He continues to [facility] with [facility] regularly.   Review of Systems  Constitutional: Negative for chills, diaphoresis, fever, malaise/fatigue and weight loss.  HENT: Negative for congestion, ear pain, sinus pain and sore throat.        No dental problems  Eyes: Negative for blurred vision, discharge and redness.  Respiratory: Negative for cough and sputum production.   Cardiovascular: Negative for chest pain and leg swelling.  Gastrointestinal: Negative for abdominal pain, diarrhea and vomiting.  Genitourinary: Negative for dysuria and flank pain.  Musculoskeletal: Negative for back pain, joint pain, myalgias and neck pain.  Skin: Negative for rash.  Neurological: Positive for headaches. Negative for dizziness and tingling.  Psychiatric/Behavioral: Negative for depression and substance abuse. The patient is not nervous/anxious and does not have insomnia.     Past Medical History:  Diagnosis Date  . Asthma   . Headache(784.0)   . HIV infection (HCC)     Social History   Tobacco Use  . Smoking status: Light Tobacco Smoker    Packs/day: 0.10    Types: Cigarettes  . Smokeless tobacco: Never Used  . Tobacco comment: "only when super stressed"  Substance Use Topics  . Alcohol use: Yes    Comment: occ  . Drug use: Yes    Frequency: 3.0 times per week    Types: Marijuana    Family History  Problem Relation Age of Onset  . Stroke Mother   . Hypertension Mother   . Diabetes Father     No Known Allergies  Objective:  Vitals:   09/11/17 1011  BP: 140/79  Pulse: 78  Temp: 98 F (36.7 C)  TempSrc: Oral  Weight: 251 lb (113.9 kg)  Height: 6\' 4"  (1.93 m)   Body mass index is 30.55 kg/m.  Physical Exam  Constitutional: He is oriented to person, place, and time and well-developed, well-nourished, and in no distress.  Pleasant and in good spirits. Smiling on exam table.   HENT:  Mouth/Throat: No oral lesions. Normal dentition. No dental caries.  Eyes: No scleral icterus.  Cardiovascular:  Normal rate, regular rhythm and normal heart sounds.  Pulmonary/Chest: Effort normal and breath sounds normal.  Abdominal: Soft. He exhibits no distension. There is no tenderness.  Lymphadenopathy:    He has no cervical adenopathy.  Neurological: He is alert and oriented to person, place, and time.  Skin: Skin is warm and dry. No rash noted.  Psychiatric: Mood and affect normal.    Lab Results Lab Results  Component Value Date   WBC 9.3 08/15/2017   HGB 14.1 08/15/2017   HCT 42.1 08/15/2017   MCV 82.7 08/15/2017   PLT 236 08/15/2017    Lab Results  Component Value Date   CREATININE 1.13 07/28/2017   BUN 13 07/28/2017   NA 134 (L) 07/28/2017   K 3.6 07/28/2017   CL 102 07/28/2017   CO2 25 07/28/2017    Lab Results  Component Value Date   ALT 15 (L) 06/20/2017   AST 19 06/20/2017   ALKPHOS 89 06/20/2017   BILITOT 1.2 06/20/2017    Lab Results  Component Value Date   CHOL 122 05/22/2017   HDL 21 (L) 05/22/2017   TRIG 86 05/22/2017   CHOLHDL 5.8 (H) 05/22/2017   HIV 1 RNA Quant  Date Value  08/15/2017 <20 NOT DETECTED copies/mL  07/11/2017 <20 NOT DETECTED copies/mL  05/22/2017 10,400 Copies/mL (H)   CD4 T Cell Abs (/uL)  Date Value  05/17/2017 770   No results found for: HAV Lab Results  Component Value Date   HEPBSAG NON-REACTIVE 05/22/2017   HEPBSAB NON-REACTIVE 05/22/2017   No results found for: HCVAB Lab Results  Component Value Date   CHLAMYDIAWP Negative 05/22/2017   CHLAMYDIAWP **POSITIVE** (A) 05/22/2017   N Negative 05/22/2017   N Negative 05/22/2017   No results found for: GCPROBEAPT No results found for: QUANTGOLD No results found for: RPR    Problem List Items Addressed This Visit      Digestive   RESOLVED: Proctocolitis, infectious     Gerald Lee is 100% improved regarding back pain, myalgias, malaise and fevers since starting on Doxycycline. 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 He is nearly complete with his Doxycycline and I have instructed him to continue with this until complete. Will repeat rectal swab today to ensure cure from previous chlamydia infection.          Other   Healthcare maintenance    Hep B #2 and HPV #1 today. He will return for HPV #2 at his lab visit. Hep B #3 at upcoming visit with titers to be checked around the summer time. He has already received flu vaccine and pneumovax. Recommend Menveo at next appointment as well. Prevnar in October 2019.       History of syphilis    Continue to follow titers and monitor for re-infection at next lab draw.      Relevant  Orders   RPR   HIV (human immunodeficiency virus infection) (HCC) - Primary (Chronic)    His HIV is wonderfuly controlled on Biktarvy. He has done extremely well with his adherence and undetectable for 2 months now. Discussed U=U concept regarding essentially zero transmissibility risk of his HIV infection; also discussed safe sex counseling and prevention of other STIs which we discussed today that he can be reinfected with easily and as frequently as he puts himself at risk. He can return in 3 months for routine follow up with labs prior to.        Relevant Orders   HIV 1 RNA quant-no reflex-bld   T-helper cell (CD4)- ([facility] clinic only)   HPV 9-valent vaccine,Recombinat (Completed)   Hepatitis B vaccine adult IM (Completed)    Other Visit Diagnoses    History of chlamydia       Relevant Orders   Cytology (oral, anal, urethral) ancillary only     Rexene Alberts, MSN, NP-C [facility] Pager: [phone]  09/11/17 10:19 AM

## 2017-09-11 NOTE — Assessment & Plan Note (Addendum)
Continue to follow titers and monitor for re-infection at next lab draw.

## 2017-09-11 NOTE — Assessment & Plan Note (Addendum)
His HIV is wonderfuly controlled on Biktarvy. He has done extremely well with his adherence and undetectable for 2 months now. Discussed U=U concept regarding essentially zero transmissibility risk of his HIV infection; also discussed safe sex counseling and prevention of other STIs which we discussed today that he can be reinfected with easily and as frequently as he puts himself at risk. He can return in 3 months for routine follow up with labs prior to.

## 2017-09-11 NOTE — Assessment & Plan Note (Signed)
Hep B #2 and HPV #1 today. He will return for HPV #2 at his lab visit. Hep B #3 at upcoming visit with titers to be checked around the summer time. He has already received flu vaccine and pneumovax. Recommend Menveo at next appointment as well. Prevnar in October 2019.

## 2017-09-12 LAB — CYTOLOGY, (ORAL, ANAL, URETHRAL) ANCILLARY ONLY
CHLAMYDIA, DNA PROBE: NEGATIVE
Neisseria Gonorrhea: NEGATIVE

## 2017-09-14 ENCOUNTER — Other Ambulatory Visit: Payer: Self-pay | Admitting: Infectious Diseases

## 2017-09-26 MED FILL — BIKTARVY 50-200-25 MG TABS: 50-200-25 | 30 days supply | Qty: 30 | Fill #4

## 2017-09-29 ENCOUNTER — Other Ambulatory Visit: Payer: Self-pay | Admitting: Pharmacist

## 2017-10-02 ENCOUNTER — Other Ambulatory Visit: Payer: Self-pay | Admitting: Infectious Diseases

## 2017-10-06 ENCOUNTER — Encounter: Payer: Self-pay | Admitting: Infectious Diseases

## 2017-10-13 ENCOUNTER — Emergency Department (HOSPITAL_COMMUNITY)
Admission: EM | Admit: 2017-10-13 | Discharge: 2017-10-13 | Disposition: A | Discharge status: Home / Self Care | Payer: Medicaid Other | Attending: Emergency Medicine | Admitting: Emergency Medicine

## 2017-10-13 ENCOUNTER — Encounter (HOSPITAL_COMMUNITY): Payer: Self-pay | Admitting: *Deleted

## 2017-10-13 ENCOUNTER — Other Ambulatory Visit: Payer: Self-pay

## 2017-10-13 DIAGNOSIS — F17210 Nicotine dependence, cigarettes, uncomplicated: Secondary | ICD-10-CM | POA: Insufficient documentation

## 2017-10-13 DIAGNOSIS — Z202 Contact with and (suspected) exposure to infections with a predominantly sexual mode of transmission: Secondary | ICD-10-CM | POA: Diagnosis present

## 2017-10-13 DIAGNOSIS — F1210 Cannabis abuse, uncomplicated: Secondary | ICD-10-CM | POA: Diagnosis not present

## 2017-10-13 DIAGNOSIS — B20 Human immunodeficiency virus [HIV] disease: Secondary | ICD-10-CM | POA: Insufficient documentation

## 2017-10-13 DIAGNOSIS — R369 Urethral discharge, unspecified: Secondary | ICD-10-CM | POA: Diagnosis not present

## 2017-10-13 DIAGNOSIS — J45909 Unspecified asthma, uncomplicated: Secondary | ICD-10-CM | POA: Diagnosis not present

## 2017-10-13 DIAGNOSIS — Z711 Person with feared health complaint in whom no diagnosis is made: Secondary | ICD-10-CM | POA: Insufficient documentation

## 2017-10-13 LAB — URINALYSIS, ROUTINE W REFLEX MICROSCOPIC
BILIRUBIN URINE: NEGATIVE
Glucose, UA: NEGATIVE mg/dL
Hgb urine dipstick: NEGATIVE
Ketones, ur: NEGATIVE mg/dL
Leukocytes, UA: NEGATIVE
Nitrite: NEGATIVE
Protein, ur: NEGATIVE mg/dL
SPECIFIC GRAVITY, URINE: 1.027 (ref 1.005–1.030)
pH: 6 (ref 5.0–8.0)

## 2017-10-13 NOTE — ED Notes (Signed)
Pt would not like to be seen any more and would like to go to his clinic in the morning.

## 2017-10-13 NOTE — ED Triage Notes (Signed)
Wants to bhe checked for  Std his partner has a penile discharge  He does not have any symptoms yet  He is hiv

## 2017-10-13 NOTE — ED Notes (Signed)
Pt in room A8

## 2017-10-13 NOTE — ED Provider Notes (Signed)
Gerald Lee Provider Note   CSN: [id number] Arrival date & time: 10/13/17  1608     History   Chief Complaint Chief Complaint  Patient presents with  . Exposure to STD    HPI Gerald Lee is a 22 y.o. male presenting with his partner who is having penile discharge after they had intercourse requesting to also be evaluated.  Patient denies any symptoms.  He is known HIV positive and reports nondetectable load at this time currently being followed and on medications.  HPI  Past Medical History:  Diagnosis Date  . Asthma   . Headache(784.0)   . HIV infection [facility])     Patient Active Problem List   Diagnosis Date Noted  . History of syphilis 07/11/2017  . 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  . HIV (human immunodeficiency virus infection) (HCC) 05/22/2017  . Healthcare maintenance 05/22/2017  . Migraine without aura 03/14/2014  . Obesity, unspecified 03/14/2014  . Insomnia, unspecified 03/14/2014    Past Surgical History:  Procedure Laterality Date  . CIRCUMCISION  1998  . HERNIA REPAIR     Done between the ages of 42 or 8 years   . NO PAST SURGERIES         Home Medications    Prior to Admission medications   Medication Sig Start Date End Date Taking? Authorizing Provider  bictegravir-emtricitabine-tenofovir AF (BIKTARVY) 50-200-25 MG TABS tablet Take 1 tablet by mouth daily. For HIV 06/28/17   Money, Gerlene Burdock, FNP  hydrOXYzine (ATARAX/VISTARIL) 25 MG tablet Take 1 tablet (25 mg total) by mouth 3 (three) times daily as needed for anxiety. 06/27/17   Money, Gerlene Burdock, FNP  ondansetron (ZOFRAN) 4 MG tablet Take 1 tablet (4 mg total) by mouth every 12 (twelve) hours as needed (30 minutes before antibiotic if needed to prevent nausea). 08/25/17   Blanchard Kelch, NP  traZODone (DESYREL) 50 MG tablet TAKE 1 TABLET BY MOUTH EVERYDAY AT BEDTIME 08/31/17   [provider]  venlafaxine  XR (EFFEXOR-XR) 75 MG 24 hr capsule Take 1 capsule (75 mg total) by mouth daily with breakfast. For mood control 06/28/17   Money, Gerlene Burdock, FNP    Family History Family History  Problem Relation Age of Onset  . Stroke Mother   . Hypertension Mother   . Diabetes Father     Social History Social History   Tobacco Use  . Smoking status: Light Tobacco Smoker    Packs/day: 0.10    Types: Cigarettes  . Smokeless tobacco: Never Used  . Tobacco comment: "only when super stressed"  Substance Use Topics  . Alcohol use: Yes    Comment: occ  . Drug use: Yes    Frequency: 3.0 times per week    Types: Marijuana     Allergies   Patient has no known allergies.   Review of Systems Review of Systems  Constitutional: Negative for chills and fever.  Genitourinary: Negative for decreased urine volume, difficulty urinating, dysuria, hematuria, penile pain, penile swelling, scrotal swelling and testicular pain.  Musculoskeletal: Negative for myalgias, neck pain and neck stiffness.  Skin: Negative for color change, pallor, rash and wound.     Physical Exam Updated Vital Signs BP 129/78 (BP Location: Left Arm)   Pulse 75   Temp 98.6 F (37 C) (Oral)   Resp 16   Ht 6\' 4"  (1.93 m)   Wt 113.4 kg (250 lb)   SpO2 100%  BMI 30.43 kg/m   Physical Exam  Constitutional: He appears well-developed and well-nourished. No distress.  Well appearing, nontoxic afebrile sitting comfortably in chair in no acute distress.  HENT:  Head: Normocephalic and atraumatic.  Eyes: Conjunctivae are normal.  Neck: Neck supple.  Cardiovascular: Normal rate.  Pulmonary/Chest: Effort normal. No respiratory distress.  Abdominal: Soft. He exhibits no distension. There is no tenderness.  Genitourinary: Penis normal. No penile tenderness.  Genitourinary Comments: No pain, swelling, or discharge on exam.  Musculoskeletal: Normal range of motion. He exhibits no edema.  Neurological: He is alert.  Skin: Skin  is warm and dry. No rash noted. He is not diaphoretic. No erythema. No pallor.  Psychiatric: He has a normal mood and affect.  Nursing note and vitals reviewed.    ED Treatments / Results  Labs (all labs ordered are listed, but only abnormal results are displayed) Labs Reviewed  URINALYSIS, ROUTINE W REFLEX MICROSCOPIC  RPR  GC/CHLAMYDIA PROBE AMP ([place]    EKG  EKG Interpretation None       Radiology No results found.  Procedures Procedures (including critical care time)  Medications Ordered in ED Medications - No data to display   Initial Impression / Assessment and Plan / ED Course  I have reviewed the triage vital signs and the nursing notes.  Pertinent labs & imaging results that were available during my care of the patient were reviewed by me and considered in my medical decision making (see chart for details).    Patient presenting for STD check after potential exposure and symptomatic partner.  Denies any symptoms. Swabs were obtained and sent for culture.  No empiric treatment at this time given no symptoms. Well-appearing, nontoxic and afebrile.  Discharge home with close PCP follow-up.  Discussed return precautions and advised to return if worsening or new concerning symptoms.   Final Clinical Impressions(s) / ED Diagnoses   Final diagnoses:  Concern about STD in male without diagnosis    ED Discharge Orders    None       Gregary CromerMitchell, Kirstein Baxley B, PA-C 10/13/17 2123    Gwyneth SproutPlunkett, Whitney, MD 10/14/17 1555

## 2017-10-13 NOTE — Discharge Instructions (Signed)
As discussed, you will receive a phone call if any of your results returned positive.  Stay well-hydrated and follow-up with your primary care provider.  Return if experiencing any new concerning symptoms in the meantime.

## 2017-10-15 LAB — RPR, QUANT+TP ABS (REFLEX): TREPONEMA PALLIDUM AB: POSITIVE — AB

## 2017-10-15 LAB — RPR: RPR Ser Ql: REACTIVE — AB

## 2017-10-16 LAB — GC/CHLAMYDIA PROBE AMP (~~LOC~~) NOT AT ARMC
Chlamydia: NEGATIVE
Neisseria Gonorrhea: NEGATIVE

## 2017-10-20 ENCOUNTER — Encounter: Payer: Self-pay | Admitting: Infectious Diseases

## 2017-10-23 ENCOUNTER — Encounter (HOSPITAL_COMMUNITY): Payer: Self-pay | Admitting: Emergency Medicine

## 2017-10-23 ENCOUNTER — Ambulatory Visit (HOSPITAL_COMMUNITY)
Admission: EM | Admit: 2017-10-23 | Discharge: 2017-10-23 | Disposition: A | Discharge status: Home / Self Care | Payer: Medicaid Other | Attending: Family Medicine | Admitting: Family Medicine

## 2017-10-23 ENCOUNTER — Other Ambulatory Visit: Payer: Self-pay

## 2017-10-23 ENCOUNTER — Telehealth (HOSPITAL_COMMUNITY): Payer: Self-pay | Admitting: Family Medicine

## 2017-10-23 DIAGNOSIS — R69 Illness, unspecified: Secondary | ICD-10-CM | POA: Diagnosis not present

## 2017-10-23 DIAGNOSIS — J111 Influenza due to unidentified influenza virus with other respiratory manifestations: Secondary | ICD-10-CM

## 2017-10-23 MED ORDER — HYDROCODONE-HOMATROPINE 5-1.5 MG/5ML PO SYRP: 5.0000 mL | ORAL_SOLUTION | Freq: Four times a day (QID) | ORAL | 0 refills | Status: DC | PRN

## 2017-10-23 MED ORDER — OSELTAMIVIR PHOSPHATE 75 MG PO CAPS: 75.0000 mg | ORAL_CAPSULE | Freq: Two times a day (BID) | ORAL | 0 refills | Status: AC

## 2017-10-23 MED ORDER — OSELTAMIVIR PHOSPHATE 75 MG PO CAPS: 75.0000 mg | ORAL_CAPSULE | Freq: Two times a day (BID) | ORAL | 0 refills | Status: DC

## 2017-10-23 NOTE — Telephone Encounter (Signed)
Resending medications to [facility]. They call and reported eRx never received.

## 2017-10-23 NOTE — ED Provider Notes (Signed)
[facility]   [id number] 10/23/17 Arrival Time: 1011  ASSESSMENT & PLAN:  1. Influenza-like illness     Meds ordered this encounter  Medications  . HYDROcodone-homatropine (HYCODAN) 5-1.5 MG/5ML syrup    Sig: Take 5 mLs by mouth every 6 (six) hours as needed for cough.    Dispense:  90 mL    Refill:  0  . oseltamivir (TAMIFLU) 75 MG capsule    Sig: Take 1 capsule (75 mg total) by mouth 2 (two) times daily for 5 days.    Dispense:  10 capsule    Refill:  0   Work note given. Cough medication sedation precautions. Discussed typical duration of symptoms. OTC symptom care as needed. Ensure adequate fluid intake and rest. May f/u with PCP or here as needed.  Reviewed expectations re: course of current medical issues. Questions answered. Outlined signs and symptoms indicating need for more acute intervention. Patient verbalized understanding. After Visit Summary given.   SUBJECTIVE: History from: patient.  Gerald Lee is a 22 y.o. male with controlled HIV who presents with complaint of nasal congestion, post-nasal drainage, and a persistent dry cough. Onset abrupt, approximately 1-2 days ago. Overall fatigued with body aches. SOB: none. Wheezing: none. Fever: yes, subjective. Overall normal PO intake without n/v. Sick contacts: no. OTC treatment: cough medication without much relief.  Immunization History  Administered Date(s) Administered  . HPV 9-valent 09/11/2017  . Hepatitis B, adult 07/11/2017, 09/11/2017  . Influenza,inj,Quad PF,6+ Mos 05/22/2017  . Pneumococcal Polysaccharide-23 06/22/2017  . Tdap 06/20/2017    Social History   Tobacco Use  Smoking Status Light Tobacco Smoker  . Packs/day: 0.10  . Types: Cigarettes  Smokeless Tobacco Never Used  Tobacco Comment   "only when super stressed"    ROS: As per HPI.   OBJECTIVE:  Vitals:   10/23/17 1058  BP: (!) 131/51  Pulse: 67  Resp: 18  Temp: 98.1 F (36.7 C)  TempSrc: Oral  SpO2:  100%     General appearance: alert; appears fatigued HEENT: nasal congestion; clear runny nose; throat irritation secondary to post-nasal drainage Neck: supple without LAD Lungs: unlabored respirations, symmetrical air entry; cough: moderate; no respiratory distress Skin: warm and dry Psychological: alert and cooperative; normal mood and affect  Imaging: No results found.  No Known Allergies  Past Medical History:  Diagnosis Date  . Asthma   . Headache(784.0)   . HIV infection (HCC)    Family History  Problem Relation Age of Onset  . Stroke Mother   . Hypertension Mother   . Diabetes Father    Social History   Socioeconomic History  . Marital status: Single    Spouse name: Not on file  . Number of children: Not on file  . Years of education: Not on file  . Highest education level: Not on file  Social Needs  . Financial resource strain: Not on file  . Food insecurity - worry: Not on file  . Food insecurity - inability: Not on file  . Transportation needs - medical: Not on file  . Transportation needs - non-medical: Not on file  Occupational History  . Not on file  Tobacco Use  . Smoking status: Light Tobacco Smoker    Packs/day: 0.10    Types: Cigarettes  . Smokeless tobacco: Never Used  . Tobacco comment: "only when super stressed"  Substance and Sexual Activity  . Alcohol use: Yes    Comment: occ  . Drug use: Yes  Frequency: 3.0 times per week    Types: Marijuana  . Sexual activity: Not Currently    Partners: Male    Birth control/protection: Condom    Comment: declined condoms  Other Topics Concern  . Not on file  Social History Narrative  . Not on file           Mardella LaymanHagler, Stormi Vandevelde, MD 10/23/17 1121

## 2017-10-23 NOTE — ED Triage Notes (Signed)
Onset Saturday of not feeling well.  Patient complains of generalized aching and frequent coughing.

## 2017-10-23 NOTE — Discharge Instructions (Signed)

## 2017-11-09 ENCOUNTER — Other Ambulatory Visit: Payer: Self-pay

## 2017-11-09 MED ORDER — BICTEGRAVIR-EMTRICITAB-TENOFOV 50-200-25 MG PO TABS: 1.0000 | ORAL_TABLET | Freq: Every day | ORAL | 0 refills | Status: DC

## 2017-11-09 MED FILL — BIKTARVY 50-200-25 MG TABS: 50-200-25 | 30 days supply | Qty: 30 | Fill #5

## 2017-11-20 ENCOUNTER — Encounter: Payer: Self-pay | Admitting: Infectious Diseases

## 2017-11-20 ENCOUNTER — Ambulatory Visit (INDEPENDENT_AMBULATORY_CARE_PROVIDER_SITE_OTHER): Payer: Medicaid Other

## 2017-11-20 ENCOUNTER — Other Ambulatory Visit: Payer: Self-pay

## 2017-11-20 DIAGNOSIS — B20 Human immunodeficiency virus [HIV] disease: Secondary | ICD-10-CM

## 2017-11-20 DIAGNOSIS — Z23 Encounter for immunization: Secondary | ICD-10-CM

## 2017-11-20 DIAGNOSIS — Z8619 Personal history of other infectious and parasitic diseases: Secondary | ICD-10-CM

## 2017-11-21 LAB — T-HELPER CELL (CD4) - (RCID CLINIC ONLY)
CD4 % Helper T Cell: 28 % — ABNORMAL LOW (ref 33–55)
CD4 T Cell Abs: 1450 /uL (ref 400–2700)

## 2017-11-21 LAB — FLUORESCENT TREPONEMAL AB(FTA)-IGG-BLD: Fluorescent Treponemal ABS: REACTIVE — AB

## 2017-11-21 LAB — RPR: RPR: REACTIVE — AB

## 2017-11-21 LAB — RPR TITER: RPR Titer: 1:2 {titer} — ABNORMAL HIGH

## 2017-11-23 LAB — HIV-1 RNA QUANT-NO REFLEX-BLD
HIV 1 RNA Quant: <20 copies/mL
HIV-1 RNA Quant, Log: <1.3 Log copies/mL

## 2017-12-04 ENCOUNTER — Encounter: Payer: Self-pay | Admitting: Infectious Diseases

## 2017-12-04 ENCOUNTER — Ambulatory Visit (INDEPENDENT_AMBULATORY_CARE_PROVIDER_SITE_OTHER): Payer: Self-pay | Admitting: Infectious Diseases

## 2017-12-04 VITALS — BP 113/74 | HR 78 | Temp 98.6°F | Ht 76.0 in | Wt 268.0 lb

## 2017-12-04 DIAGNOSIS — Z8619 Personal history of other infectious and parasitic diseases: Secondary | ICD-10-CM

## 2017-12-04 DIAGNOSIS — Z0000 Encounter for general adult medical examination without abnormal findings: Secondary | ICD-10-CM

## 2017-12-04 DIAGNOSIS — Z21 Asymptomatic human immunodeficiency virus [HIV] infection status: Secondary | ICD-10-CM

## 2017-12-04 NOTE — Assessment & Plan Note (Signed)
Seropositive at 1:2. No indications for repeat treatment. Continue to monitor. Educated he can become re-infected with this again.

## 2017-12-04 NOTE — Assessment & Plan Note (Signed)
Needs HB#3 today (we are out at the moment but he will call this week to schedule a nurse visit). HPV#3 and Menveo #1 at next visit.

## 2017-12-04 NOTE — Patient Instructions (Addendum)
Please call back in 1 week to see if we got the Hepatitis B vaccine in for you. Can come for a nurse visit to receive it.   Next time you come we need to do your 3rd HPV vaccine and will start your meningococcal vaccine (this protects against meningitis).   Please come back in 3 months with labs 2 weeks before.   You look wonderful!

## 2017-12-04 NOTE — Assessment & Plan Note (Signed)
He is doing extremely well with managing his HIV. Congratulated him on his efforts. Discussed importance of long-term good adherence to reduce likelihood of mutatoins. Discussed U=U concept in addition to safe sex counseling and prevention of other STIs.  Will give him some information to have his partner set up in our uninsured PrEP clinic  Continue Biktarvy. He will return in 3 months with labs prior to visit.

## 2017-12-04 NOTE — Progress Notes (Signed)
Name: Gerald Lee  DOB: [birth year]  MRN: [id number] PCP: Fleet ContrasAvbuere, Edwin, MD    Patient Active Problem List   Diagnosis Date Noted  . History of syphilis 07/11/2017  . 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  . HIV (human immunodeficiency virus infection) (HCC) 05/22/2017  . Healthcare maintenance 05/22/2017  . Migraine without aura 03/14/2014  . Obesity, unspecified 03/14/2014  . Insomnia, unspecified 03/14/2014    Brief Narrative:  Gerald Lee is a 22 y.o. AA male with HIV infection. Diagnosed September 2018 with CD4 nadir 780. Started immediately on Biktarvy. Also diagnosed with syphilis at the same time. Was assessed for neurosyphilis with severe headaches but LP negative. High Risk: MSM. Previous OIs: none  Previous Regimens:   Biktarvy 2018 --> suppressed   Genotype:   02/2017 - no mutations   Subjective:   Chief Complaint  Patient presents with  . Follow-up    missed one does of Biktarvy in the last month    Gerald Lee is feeling very well today. He is happy about starting a new job soon that will fit with what he needs to keep his health a priority. He has only missed one dose of Biktarvy and recalls falling asleep early that night. He now has an alarm on his phone to ensure that if this happens again it wakes him up. Reports no complaints today suggestive of associated opportunistic infection or advancing HIV disease such as fevers, night sweats, weight loss, anorexia, cough, SOB, nausea, vomiting, diarrhea, headache, sensory changes, lymphadenopathy or oral thrush. He has a steady boyfriend whom would like to get into our PrEP clinic- says he knows the "U=U" stuff but would like the comfort of one more measure to prevent transmission.   His depression is under excellent control and is very happy with current doses of medications. [facility] and continues on his Effexor, Trazodone QHS and hydroxyzine PRN anxiety. Still with some  occasional headaches but they are improved in quantity.   Review of Systems  Constitutional: Negative for chills, diaphoresis, fever, malaise/fatigue and weight loss.  HENT: Negative for congestion, ear pain, sinus pain and sore throat.        No dental problems  Eyes: Negative for blurred vision, discharge and redness.  Respiratory: Negative for cough and sputum production.   Cardiovascular: Negative for chest pain and leg swelling.  Gastrointestinal: Negative for abdominal pain, diarrhea and vomiting.  Genitourinary: Negative for dysuria and flank pain.  Musculoskeletal: Negative for back pain, joint pain, myalgias and neck pain.  Skin: Negative for rash.  Neurological: Positive for headaches. Negative for dizziness and tingling.  Psychiatric/Behavioral: Negative for depression and substance abuse. The patient is not nervous/anxious and does not have insomnia.     Past Medical History:  Diagnosis Date  . Asthma   . Headache(784.0)   . HIV infection (HCC)     Social History   Tobacco Use  . Smoking status: Light Tobacco Smoker    Packs/day: 0.10    Types: Cigarettes, Cigars  . Smokeless tobacco: Never Used  . Tobacco comment: "only when super stressed"  Substance Use Topics  . Alcohol use: Yes    Comment: occ  . Drug use: Yes    Frequency: 3.0 times per week    Types: Marijuana    Family History  Problem Relation Age of Onset  . Stroke Mother   . Hypertension Mother   . Diabetes Father     No Known Allergies  Objective:  Vitals:   12/04/17 0954  BP: 113/74  Pulse: 78  Temp: 98.6 F (37 C)  TempSrc: Oral  Weight: 268 lb (121.6 kg)  Height: 6\' 4"  (1.93 m)   Body mass index is 32.62 kg/m.  Physical Exam  Constitutional: He is oriented to person, place, and time and well-developed, well-nourished, and in no distress.  Pleasant and in good spirits.  HENT:  Mouth/Throat: No oral lesions. Normal dentition. No dental caries.  Eyes: No scleral icterus.    Cardiovascular: Normal rate, regular rhythm and normal heart sounds.  Pulmonary/Chest: Effort normal and breath sounds normal.  Abdominal: Soft. He exhibits no distension. There is no tenderness.  Lymphadenopathy:    He has no cervical adenopathy.  Neurological: He is alert and oriented to person, place, and time.  Skin: Skin is warm and dry. No rash noted.  Psychiatric: Mood and affect normal.    Lab Results Lab Results  Component Value Date   WBC 9.3 08/15/2017   HGB 14.1 08/15/2017   HCT 42.1 08/15/2017   MCV 82.7 08/15/2017   PLT 236 08/15/2017    Lab Results  Component Value Date   CREATININE 1.13 07/28/2017   BUN 13 07/28/2017   NA 134 (L) 07/28/2017   K 3.6 07/28/2017   CL 102 07/28/2017   CO2 25 07/28/2017    Lab Results  Component Value Date   ALT 15 (L) 06/20/2017   AST 19 06/20/2017   ALKPHOS 89 06/20/2017   BILITOT 1.2 06/20/2017    Lab Results  Component Value Date   CHOL 122 05/22/2017   HDL 21 (L) 05/22/2017   LDLCALC 83 05/22/2017   TRIG 86 05/22/2017   CHOLHDL 5.8 (H) 05/22/2017   HIV 1 RNA Quant (copies/mL)  Date Value  11/20/2017 <20 NOT DETECTED  08/15/2017 <20 NOT DETECTED  07/11/2017 <20 NOT DETECTED   CD4 T Cell Abs (/uL)  Date Value  11/20/2017 1,450  05/17/2017 770   No results found for: HAV Lab Results  Component Value Date   HEPBSAG NON-REACTIVE 05/22/2017   HEPBSAB NON-REACTIVE 05/22/2017   No results found for: HCVAB Lab Results  Component Value Date   CHLAMYDIAWP Negative 10/13/2017   N Negative 10/13/2017   No results found for: GCPROBEAPT No results found for: QUANTGOLD No results found for: RPR    Problem List Items Addressed This Visit      Other   HIV (human immunodeficiency virus infection) (HCC) - Primary (Chronic)    He is doing extremely well with managing his HIV. Congratulated him on his efforts. Discussed importance of long-term good adherence to reduce likelihood of mutatoins. Discussed U=U  concept in addition to safe sex counseling and prevention of other STIs.  Will give him some information to have his partner set up in our uninsured PrEP clinic  Continue Biktarvy. He will return in 3 months with labs prior to visit.       Relevant Orders   T-helper cell (CD4)- ([facility] clinic only)   HIV 1 RNA quant-no reflex-bld   CBC   Lipid panel   Comprehensive metabolic panel   Urinalysis   Urine cytology ancillary only   Healthcare maintenance    Needs HB#3 today (we are out at the moment but he will call this week to schedule a nurse visit). HPV#3 and Menveo #1 at next visit.       History of syphilis    Seropositive at 1:2. No indications for repeat treatment. Continue to monitor. Educated  he can become re-infected with this again.       Relevant Orders   RPR     Rexene Alberts, MSN, NP-C [facility] for Infectious Disease [facility] Pager: [phone]  12/04/17 10:19 AM

## 2017-12-06 ENCOUNTER — Ambulatory Visit: Payer: Self-pay

## 2017-12-20 ENCOUNTER — Other Ambulatory Visit: Payer: Self-pay | Admitting: Behavioral Health

## 2017-12-20 DIAGNOSIS — Z21 Asymptomatic human immunodeficiency virus [HIV] infection status: Secondary | ICD-10-CM

## 2017-12-20 MED ORDER — BICTEGRAVIR-EMTRICITAB-TENOFOV 50-200-25 MG PO TABS: 1.0000 | ORAL_TABLET | Freq: Every day | ORAL | 2 refills | Status: DC

## 2018-01-02 ENCOUNTER — Ambulatory Visit: Payer: Self-pay | Admitting: Infectious Diseases

## 2018-01-03 ENCOUNTER — Emergency Department (HOSPITAL_COMMUNITY)
Admission: EM | Admit: 2018-01-03 | Discharge: 2018-01-03 | Disposition: A | Discharge status: Home / Self Care | Payer: Self-pay | Attending: Emergency Medicine | Admitting: Emergency Medicine

## 2018-01-03 ENCOUNTER — Encounter (HOSPITAL_COMMUNITY): Payer: Self-pay | Admitting: Emergency Medicine

## 2018-01-03 ENCOUNTER — Other Ambulatory Visit: Payer: Self-pay

## 2018-01-03 ENCOUNTER — Emergency Department (HOSPITAL_COMMUNITY): Payer: Self-pay

## 2018-01-03 DIAGNOSIS — Z79899 Other long term (current) drug therapy: Secondary | ICD-10-CM | POA: Insufficient documentation

## 2018-01-03 DIAGNOSIS — R519 Headache, unspecified: Secondary | ICD-10-CM

## 2018-01-03 DIAGNOSIS — J45909 Unspecified asthma, uncomplicated: Secondary | ICD-10-CM | POA: Insufficient documentation

## 2018-01-03 DIAGNOSIS — R55 Syncope and collapse: Secondary | ICD-10-CM | POA: Insufficient documentation

## 2018-01-03 DIAGNOSIS — R51 Headache: Secondary | ICD-10-CM | POA: Insufficient documentation

## 2018-01-03 DIAGNOSIS — B20 Human immunodeficiency virus [HIV] disease: Secondary | ICD-10-CM | POA: Insufficient documentation

## 2018-01-03 DIAGNOSIS — F17210 Nicotine dependence, cigarettes, uncomplicated: Secondary | ICD-10-CM | POA: Insufficient documentation

## 2018-01-03 LAB — URINALYSIS, ROUTINE W REFLEX MICROSCOPIC
Bilirubin Urine: NEGATIVE
Glucose, UA: NEGATIVE mg/dL
Hgb urine dipstick: NEGATIVE
KETONES UR: NEGATIVE mg/dL
LEUKOCYTES UA: NEGATIVE
NITRITE: NEGATIVE
PH: 6 (ref 5.0–8.0)
Protein, ur: NEGATIVE mg/dL
Specific Gravity, Urine: 1.018 (ref 1.005–1.030)

## 2018-01-03 LAB — COMPREHENSIVE METABOLIC PANEL
ALBUMIN: 3.9 g/dL (ref 3.5–5.0)
ALT: 21 U/L (ref 17–63)
AST: 29 U/L (ref 15–41)
Alkaline Phosphatase: 87 U/L (ref 38–126)
Anion gap: 7 (ref 5–15)
BUN: 14 mg/dL (ref 6–20)
CO2: 24 mmol/L (ref 22–32)
Calcium: 9.3 mg/dL (ref 8.9–10.3)
Chloride: 108 mmol/L (ref 101–111)
Creatinine, Ser: 1.06 mg/dL (ref 0.61–1.24)
GFR calc Af Amer: >60 mL/min (ref >60)
Glucose, Bld: 73 mg/dL (ref 65–99)
Potassium: 4.6 mmol/L (ref 3.5–5.1)
Sodium: 139 mmol/L (ref 135–145)
Total Bilirubin: 1.2 mg/dL (ref 0.3–1.2)
Total Protein: 7.2 g/dL (ref 6.5–8.1)

## 2018-01-03 LAB — CBC
HEMATOCRIT: 44.9 % (ref 39.0–52.0)
HEMOGLOBIN: 15.2 g/dL (ref 13.0–17.0)
MCH: 29.2 pg (ref 26.0–34.0)
MCHC: 33.9 g/dL (ref 30.0–36.0)
MCV: 86.3 fL (ref 78.0–100.0)
Platelets: 208 10*3/uL (ref 150–400)
RBC: 5.2 MIL/uL (ref 4.22–5.81)
RDW: 13.4 % (ref 11.5–15.5)
WBC: 7.8 10*3/uL (ref 4.0–10.5)

## 2018-01-03 LAB — CK: CK TOTAL: 346 U/L (ref 49–397)

## 2018-01-03 NOTE — ED Notes (Signed)
Pt is aware a urine specimen is needed. Pt stated "I haven't had anything to drink all day so it's gonna be a minute".

## 2018-01-03 NOTE — ED Notes (Signed)
Verbal orders placed.  

## 2018-01-03 NOTE — ED Provider Notes (Signed)
[facility]-EMERGENCY DEPT Provider Note   CSN: [id number] Arrival date & time: 01/03/18  1121     History   Chief Complaint Chief Complaint  Patient presents with  . Overheated    HPI LADISLAO COHENOUR is a 22 y.o. male hx of HIV (compliant with meds, quant undetectable, nl CD 4 count), here with headache, near syncope.  Patient states that he was at work earlier today and had a sudden onset of headache and felt lightheaded and dizzy and almost passed out.  Patient states that he was actually in an air conditioning room at that time and was not out in the heat.  Patient states that he does have headaches but usually not very severe and he is not on any medicines for headaches.  He denies any weakness or numbness or trouble speaking.  Patient states that his mother had mini strokes before.   The history is provided by the patient.    Past Medical History:  Diagnosis Date  . Asthma   . Headache(784.0)   . HIV infection [facility])     Patient Active Problem List   Diagnosis Date Noted  . History of syphilis 07/11/2017  . 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  . HIV (human immunodeficiency virus infection) (HCC) 05/22/2017  . Healthcare maintenance 05/22/2017  . Migraine without aura 03/14/2014  . Obesity, unspecified 03/14/2014  . Insomnia, unspecified 03/14/2014    Past Surgical History:  Procedure Laterality Date  . CIRCUMCISION  1998  . HERNIA REPAIR     Done between the ages of 22 or 8 years   . NO PAST SURGERIES          Home Medications    Prior to Admission medications   Medication Sig Start Date End Date Taking? Authorizing Provider  bictegravir-emtricitabine-tenofovir AF (BIKTARVY) 50-200-25 MG TABS tablet Take 1 tablet by mouth daily. For HIV 12/20/17  Yes Dixon, Gomez Cleverly, NP  HYDROcodone-homatropine (HYCODAN) 5-1.5 MG/5ML syrup Take 5 mLs by mouth every 6 (six) hours as needed for cough. Patient not  taking: Reported on 01/03/2018 10/23/17   Mardella Layman, MD  hydrOXYzine (ATARAX/VISTARIL) 25 MG tablet Take 1 tablet (25 mg total) by mouth 3 (three) times daily as needed for anxiety. 06/27/17   Money, Gerlene Burdock, FNP  ondansetron (ZOFRAN) 4 MG tablet Take 1 tablet (4 mg total) by mouth every 12 (twelve) hours as needed (30 minutes before antibiotic if needed to prevent nausea). Patient not taking: Reported on 01/03/2018 08/25/17   Blanchard Kelch, NP  traZODone (DESYREL) 50 MG tablet TAKE 1 TABLET BY MOUTH EVERYDAY AT BEDTIME 08/31/17   [provider]  venlafaxine XR (EFFEXOR-XR) 75 MG 24 hr capsule Take 1 capsule (75 mg total) by mouth daily with breakfast. For mood control 06/28/17   Money, Gerlene Burdock, FNP    Family History Family History  Problem Relation Age of Onset  . Stroke Mother   . Hypertension Mother   . Diabetes Father     Social History Social History   Tobacco Use  . Smoking status: Light Tobacco Smoker    Packs/day: 0.10    Types: Cigarettes, Cigars  . Smokeless tobacco: Never Used  . Tobacco comment: "only when super stressed"  Substance Use Topics  . Alcohol use: Yes    Comment: occ  . Drug use: Yes    Frequency: 3.0 times per week    Types: Marijuana     Allergies   Ibuprofen  Review of Systems Review of Systems  Neurological: Positive for dizziness and headaches.  All other systems reviewed and are negative.    Physical Exam Updated Vital Signs BP 120/75 (BP Location: Left Arm)   Pulse 79   Temp (!) 97.5 F (36.4 C) (Oral)   Resp 14   SpO2 99%   Physical Exam  Constitutional: He is oriented to person, place, and time. He appears well-developed.  Slightly anxious, well appearing   HENT:  Head: Normocephalic.  Mouth/Throat: Oropharynx is clear and moist.  Eyes: Pupils are equal, round, and reactive to light. Conjunctivae and EOM are normal.  Neck: Normal range of motion. Neck supple.  Cardiovascular: Normal rate, regular rhythm and  normal heart sounds.  Pulmonary/Chest: Effort normal and breath sounds normal. No stridor. No respiratory distress. He has no wheezes.  Abdominal: Soft. Bowel sounds are normal. He exhibits no distension. There is no tenderness.  Musculoskeletal: Normal range of motion.  Neurological: He is alert and oriented to person, place, and time.  CN 2- 12 intact. Nl strength throughout, nl gait   Skin: Skin is warm.  Psychiatric: He has a normal mood and affect.  Nursing note and vitals reviewed.    ED Treatments / Results  Labs (all labs ordered are listed, but only abnormal results are displayed) Labs Reviewed  CBC  COMPREHENSIVE METABOLIC PANEL  CK  URINALYSIS, ROUTINE W REFLEX MICROSCOPIC    EKG None  Radiology Ct Head Wo Contrast  Result Date: 01/03/2018 CLINICAL DATA:  Headache.  Focal neuro defect. EXAM: CT HEAD WITHOUT CONTRAST TECHNIQUE: Contiguous axial images were obtained from the base of the skull through the vertex without intravenous contrast. COMPARISON:  None. FINDINGS: Brain: No evidence for acute hemorrhage, mass lesion, midline shift, hydrocephalus or large infarct. Beam hardening artifact in the temporal lobes particularly on the right side. Vascular: No hyperdense vessel or unexpected calcification. Skull: Normal. Negative for fracture or focal lesion. Sinuses/Orbits: No acute finding. Other: None. IMPRESSION: No acute abnormality. Limited evaluation of the temporal lobes as described. Electronically Signed   By: Richarda Overlie M.D.   On: 01/03/2018 20:24    Procedures Procedures (including critical care time)  Medications Ordered in ED Medications - No data to display   Initial Impression / Assessment and Plan / ED Course  I have reviewed the triage vital signs and the nursing notes.  Pertinent labs & imaging results that were available during my care of the patient were reviewed by me and considered in my medical decision making (see chart for details).    JEROMIAH OHALLORAN is a 22 y.o. male here with headache, near syncope. Likely complex migraine, consider SAH as well. Neurovascular intact. Will get labs, CT head. Offered migraine cocktail but his headache resolved.   9:32 PM Labs and CT head unremarkable. Will refer to neuro outpatient for migraines.    Final Clinical Impressions(s) / ED Diagnoses   Final diagnoses:  None    ED Discharge Orders    None       Charlynne Pander, MD 01/03/18 2132

## 2018-01-03 NOTE — ED Triage Notes (Signed)
Per EMS pt at work and felt "overheated"; current headache. Pt having intermittent "spells of this" since January.

## 2018-01-03 NOTE — Discharge Instructions (Signed)
Take tylenol, motrin for headaches.   See neurologist for follow up   Return to ER if you have worse headaches, vomiting, passing out, weakness.

## 2018-01-11 ENCOUNTER — Ambulatory Visit (INDEPENDENT_AMBULATORY_CARE_PROVIDER_SITE_OTHER): Payer: Self-pay | Admitting: *Deleted

## 2018-01-11 DIAGNOSIS — Z23 Encounter for immunization: Secondary | ICD-10-CM

## 2018-01-11 DIAGNOSIS — B20 Human immunodeficiency virus [HIV] disease: Secondary | ICD-10-CM

## 2018-02-13 ENCOUNTER — Other Ambulatory Visit: Payer: Self-pay

## 2018-02-16 ENCOUNTER — Other Ambulatory Visit: Payer: Medicaid Other

## 2018-02-16 ENCOUNTER — Other Ambulatory Visit (HOSPITAL_COMMUNITY)
Admission: RE | Admit: 2018-02-16 | Discharge: 2018-02-16 | Disposition: A | Discharge status: Home / Self Care | Payer: Medicaid Other | Source: Ambulatory Visit | Attending: Infectious Diseases | Admitting: Infectious Diseases

## 2018-02-16 DIAGNOSIS — Z21 Asymptomatic human immunodeficiency virus [HIV] infection status: Secondary | ICD-10-CM

## 2018-02-16 DIAGNOSIS — Z8619 Personal history of other infectious and parasitic diseases: Secondary | ICD-10-CM

## 2018-02-16 LAB — T-HELPER CELL (CD4) - (RCID CLINIC ONLY)
CD4 T CELL ABS: 1260 /uL (ref 400–2700)
CD4 T CELL HELPER: 35 % (ref 33–55)

## 2018-02-17 LAB — URINALYSIS
Bilirubin Urine: NEGATIVE
Glucose, UA: NEGATIVE
Hgb urine dipstick: NEGATIVE
Leukocytes, UA: NEGATIVE
NITRITE: NEGATIVE
PROTEIN: NEGATIVE
SPECIFIC GRAVITY, URINE: 1.033 (ref 1.001–1.03)
pH: 5.5 (ref 5.0–8.0)

## 2018-02-19 LAB — COMPREHENSIVE METABOLIC PANEL
AG Ratio: 1.8 (calc) (ref 1.0–2.5)
ALBUMIN MSPROF: 4.3 g/dL (ref 3.6–5.1)
ALKALINE PHOSPHATASE (APISO): 92 U/L (ref 40–115)
ALT: 13 U/L (ref 9–46)
AST: 16 U/L (ref 10–40)
BUN/Creatinine Ratio: 9 (calc) (ref 6–22)
BUN: 13 mg/dL (ref 7–25)
CHLORIDE: 104 mmol/L (ref 98–110)
CO2: 24 mmol/L (ref 20–32)
CREATININE: 1.4 mg/dL — AB (ref 0.60–1.35)
Calcium: 9.5 mg/dL (ref 8.6–10.3)
GLOBULIN: 2.4 g/dL (ref 1.9–3.7)
Glucose, Bld: 106 mg/dL — ABNORMAL HIGH (ref 65–99)
POTASSIUM: 4.4 mmol/L (ref 3.5–5.3)
SODIUM: 138 mmol/L (ref 135–146)
TOTAL PROTEIN: 6.7 g/dL (ref 6.1–8.1)
Total Bilirubin: 0.5 mg/dL (ref 0.2–1.2)

## 2018-02-19 LAB — CBC
HCT: 42.6 % (ref 38.5–50.0)
Hemoglobin: 14.3 g/dL (ref 13.2–17.1)
MCH: 28.2 pg (ref 27.0–33.0)
MCHC: 33.6 g/dL (ref 32.0–36.0)
MCV: 84 fL (ref 80.0–100.0)
MPV: 10.7 fL (ref 7.5–12.5)
Platelets: 239 10*3/uL (ref 140–400)
RBC: 5.07 10*6/uL (ref 4.20–5.80)
RDW: 12.8 % (ref 11.0–15.0)
WBC: 6.4 10*3/uL (ref 3.8–10.8)

## 2018-02-19 LAB — RPR: RPR Ser Ql: NONREACTIVE

## 2018-02-19 LAB — LIPID PANEL
CHOL/HDL RATIO: 2.7 (calc) (ref <5.0)
CHOLESTEROL: 109 mg/dL (ref <200)
HDL: 40 mg/dL — ABNORMAL LOW (ref >40)
LDL CHOLESTEROL (CALC): 57 mg/dL
NON-HDL CHOLESTEROL (CALC): 69 mg/dL (ref <130)
Triglycerides: 42 mg/dL (ref <150)

## 2018-02-19 LAB — HIV-1 RNA QUANT-NO REFLEX-BLD
HIV 1 RNA Quant: <20 copies/mL
HIV-1 RNA Quant, Log: <1.3 Log copies/mL

## 2018-02-19 LAB — URINE CYTOLOGY ANCILLARY ONLY
CHLAMYDIA, DNA PROBE: NEGATIVE
NEISSERIA GONORRHEA: NEGATIVE

## 2018-02-27 ENCOUNTER — Encounter: Payer: Self-pay | Admitting: Infectious Diseases

## 2018-02-27 ENCOUNTER — Telehealth: Payer: Self-pay | Admitting: Infectious Diseases

## 2018-02-27 ENCOUNTER — Other Ambulatory Visit: Payer: Self-pay | Admitting: *Deleted

## 2018-02-27 ENCOUNTER — Ambulatory Visit (INDEPENDENT_AMBULATORY_CARE_PROVIDER_SITE_OTHER): Payer: Self-pay | Admitting: Infectious Diseases

## 2018-02-27 VITALS — BP 123/81 | HR 71 | Temp 98.6°F | Wt 290.0 lb

## 2018-02-27 DIAGNOSIS — G43009 Migraine without aura, not intractable, without status migrainosus: Secondary | ICD-10-CM

## 2018-02-27 DIAGNOSIS — Z8619 Personal history of other infectious and parasitic diseases: Secondary | ICD-10-CM

## 2018-02-27 DIAGNOSIS — B20 Human immunodeficiency virus [HIV] disease: Secondary | ICD-10-CM

## 2018-02-27 DIAGNOSIS — Z23 Encounter for immunization: Secondary | ICD-10-CM

## 2018-02-27 DIAGNOSIS — Z6835 Body mass index (BMI) 35.0-35.9, adult: Secondary | ICD-10-CM

## 2018-02-27 DIAGNOSIS — Z21 Asymptomatic human immunodeficiency virus [HIV] infection status: Secondary | ICD-10-CM

## 2018-02-27 DIAGNOSIS — Z0000 Encounter for general adult medical examination without abnormal findings: Secondary | ICD-10-CM

## 2018-02-27 DIAGNOSIS — E6609 Other obesity due to excess calories: Secondary | ICD-10-CM

## 2018-02-27 MED ORDER — TRAZODONE HCL 50 MG PO TABS: 50.0000 mg | ORAL_TABLET | Freq: Every evening | ORAL | 2 refills | Status: DC | PRN

## 2018-02-27 MED ORDER — VENLAFAXINE HCL ER 75 MG PO CP24: 75.0000 mg | ORAL_CAPSULE | Freq: Every day | ORAL | 2 refills | Status: DC

## 2018-02-27 NOTE — Assessment & Plan Note (Signed)
RPR sero reverted to non reactive. Counseled about risk for re-current infection in the future with exposure in the setting of unprotected oral/rectal sex.

## 2018-02-27 NOTE — Progress Notes (Signed)
Name: Gerald Lee  DOB: [birth year]  MRN: [id number] PCP: [name], MD    Patient Active Problem List   Diagnosis Date Noted  . History of syphilis 07/11/2017  . 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  . HIV (human immunodeficiency virus infection) (HCC) 05/22/2017  . Healthcare maintenance 05/22/2017  . Migraine without aura 03/14/2014  . Obesity, unspecified 03/14/2014  . Insomnia, unspecified 03/14/2014    Brief Narrative:  Gerald Lee is a 22 y.o. AA male with HIV infection. Diagnosed September 2018 with CD4 nadir 780. Started immediately on Biktarvy. Also diagnosed with syphilis at the same time. Was assessed for neurosyphilis with severe headaches but LP negative. High Risk: MSM. Previous OIs: none  Previous Regimens:   Biktarvy 2018 --> suppressed   Genotype:   02/2017 - no mutations   Subjective:   Chief Complaint  Patient presents with  . Follow-up    hiv  . Migraine  . Weight Gain    Gerald Lee is feeling well today. He has not missed any doses of his Biktarvy since our last meeting and has no concerns with medication s/e or access. He has an appointmen with Marcelino Duster after our visit today to re-enroll for HMAP. Reports no complaints today suggestive of associated opportunistic infection or advancing HIV disease such as fevers, night sweats, weight loss, anorexia, cough, SOB, nausea, vomiting, diarrhea, headache, sensory changes, lymphadenopathy or oral thrush.   His depression and anxiety are under excellent control however he is not taking his Effexor daily due to "no insurance." He reports he does eat out frequently and snacks often especially in the setting of stress at work. No longer with physical job duties and stays at a desk throughout the day now. Has gained approx 40# over the last 5 months.   He is worried about his migraines. He had to go the ED about a month and a half a go after he passed out in the setting of one. He  became very hot, dizzy and instant pounding frontal headache. Was in a 'big fog' due to the pain. He thinks they are associated with stress. Has been told in the past he has had vertigo symptoms and he feels these have worsened in the time he has not taken his hydroxyzine and Effexor. During inpatient [facility] admission noted to have orthostatic hypotension. He estimates he gets migraines once a week.   Review of Systems  Constitutional: Negative for chills, diaphoresis, fever, malaise/fatigue and weight loss.  HENT: Negative for congestion, ear pain, sinus pain and sore throat.        No dental problems  Eyes: Negative for blurred vision, discharge and redness.  Respiratory: Negative for cough and sputum production.   Cardiovascular: Negative for chest pain and leg swelling.  Gastrointestinal: Negative for abdominal pain, diarrhea and vomiting.  Genitourinary: Negative for dysuria and flank pain.  Musculoskeletal: Negative for back pain, joint pain, myalgias and neck pain.  Skin: Negative for rash.  Neurological: Positive for headaches. Negative for dizziness and tingling.  Psychiatric/Behavioral: Negative for depression and substance abuse. The patient is not nervous/anxious and does not have insomnia.     Past Medical History:  Diagnosis Date  . Asthma   . Headache(784.0)   . HIV infection (HCC)     Social History   Tobacco Use  . Smoking status: Light Tobacco Smoker    Packs/day: 0.10    Types: Cigarettes, Cigars  . Smokeless tobacco: Never Used  . Tobacco comment: "  only when super stressed"  Substance Use Topics  . Alcohol use: Yes    Comment: occ  . Drug use: Yes    Frequency: 3.0 times per week    Types: Marijuana    Family History  Problem Relation Age of Onset  . Stroke Mother   . Hypertension Mother   . Diabetes Father     Allergies  Allergen Reactions  . Ibuprofen Other (See Comments)    Per patient "can not take because it will shut down my kidneys"     Objective:  Vitals:   02/27/18 0949  BP: 123/81  Pulse: 71  Temp: 98.6 F (37 C)  TempSrc: Oral  Weight: 290 lb (131.5 kg)   Body mass index is 35.3 kg/m.  Physical Exam  Constitutional: He is oriented to person, place, and time and well-developed, well-nourished, and in no distress.  Pleasant and in good spirits.  HENT:  Mouth/Throat: No oral lesions. Normal dentition. No dental caries.  Eyes: No scleral icterus.  Cardiovascular: Normal rate, regular rhythm and normal heart sounds.  Pulmonary/Chest: Effort normal and breath sounds normal.  Abdominal: Soft. He exhibits no distension. There is no tenderness.  Lymphadenopathy:    He has no cervical adenopathy.  Neurological: He is alert and oriented to person, place, and time.  Skin: Skin is warm and dry. No rash noted.  Psychiatric: Mood and affect normal.   Lab Results Lab Results  Component Value Date   WBC 6.4 02/16/2018   HGB 14.3 02/16/2018   HCT 42.6 02/16/2018   MCV 84.0 02/16/2018   PLT 239 02/16/2018    Lab Results  Component Value Date   CREATININE 1.40 (H) 02/16/2018   BUN 13 02/16/2018   NA 138 02/16/2018   K 4.4 02/16/2018   CL 104 02/16/2018   CO2 24 02/16/2018    Lab Results  Component Value Date   ALT 13 02/16/2018   AST 16 02/16/2018   ALKPHOS 87 01/03/2018   BILITOT 0.5 02/16/2018    Lab Results  Component Value Date   CHOL 109 02/16/2018   HDL 40 (L) 02/16/2018   LDLCALC 57 02/16/2018   TRIG 42 02/16/2018   CHOLHDL 2.7 02/16/2018   HIV 1 RNA Quant (copies/mL)  Date Value  02/16/2018 <20 NOT DETECTED  11/20/2017 <20 NOT DETECTED  08/15/2017 <20 NOT DETECTED   CD4 T Cell Abs (/uL)  Date Value  02/16/2018 1,260  11/20/2017 1,450  05/17/2017 770   Lab Results  Component Value Date   HAV REACTIVE (A) 05/22/2017   Lab Results  Component Value Date   HEPBSAG NON-REACTIVE 05/22/2017   HEPBSAB NON-REACTIVE 05/22/2017   No results found for: HCVAB Lab Results   Component Value Date   CHLAMYDIAWP Negative 02/16/2018   N Negative 02/16/2018   No results found for: GCPROBEAPT No results found for: QUANTGOLD No results found for: RPR    Problem List Items Addressed This Visit      Cardiovascular and Mediastinum   Migraine without aura (Chronic)    No primary care provider d/t no insurance and unable to afford neurology assessment. I think the severity of his migraines have increased after stopping consistent Effexor - we discussed that this is on his HMAP formulary and will be provided under this program. He was not aware of this until now. I will sent in his Effexor and in 3 months if he is not doing better with decreased migraine frequency we can consider starting a prophylactic medication.  Would avoid BP lowering option however with his report of orthostatic hypotension.  Will send in for 9 tablets of sumitriptan for him to [facility] with GoodRx coupon for abortive therapy. Counseled.       Relevant Medications   venlafaxine XR (EFFEXOR-XR) 75 MG 24 hr capsule   traZODone (DESYREL) 50 MG tablet     Other   Obesity, unspecified (Chronic)    Wt Readings from Last 3 Encounters:  02/27/18 290 lb (131.5 kg)  12/04/17 268 lb (121.6 kg)  10/13/17 250 lb (113.4 kg)   Discussed today regarding weight gain trend since February of this year. There is some emerging information suggesting possible link with integrase drug class and weight gain - I informed him of this today but ultimately feel this is multifactorial for him. Offered consideration of medication switch. He would like to try to implement dietary and lifestyle changes to see if this can help him reduce his weight. Counseled today on setting small goals after he evaluates his diet. He will focus on reducing portions and eliminating snacks.       HIV (human immunodeficiency virus infection) (HCC) (Chronic)    He has been very well controlled on Biktarvy and undetectable for nearly a year now.  Very proud of how he is doing and discussed labs with him today. He is proud of himself. We will continue this for him for now. If he decides to switch his medication d/t the possibility of weigh gain we can offer him that. Discussed U=U concept in addition to safe sex counseling and prevention of other STIs.  He can return in 3 months to re-evaluate his migraines. Will repeat VL in 6 months.       History of syphilis    RPR sero reverted to non reactive. Counseled about risk for re-current infection in the future with exposure in the setting of unprotected oral/rectal sex.       Healthcare maintenance    HPV#3 and Menveo #1 today - he will return for RN visit in 2 months for second dose of Menveo. Due for Prevnar in October of this year.        Other Visit Diagnoses    Human immunodeficiency virus (HIV) disease (HCC)    -  Primary   Relevant Orders   HPV 9-valent vaccine,Recombinat (Completed)   MENINGOCOCCAL MCV4O(MENVEO) (Completed)     Rexene Alberts, MSN, NP-C [facility] Pager: [phone]  02/27/18 10:19 AM

## 2018-02-27 NOTE — Assessment & Plan Note (Signed)
HPV#3 and Menveo #1 today - he will return for RN visit in 2 months for second dose of Menveo. Due for Prevnar in October of this year.

## 2018-02-27 NOTE — Patient Instructions (Addendum)
Continue your Susanne BordersBiktarvy - this may be contributing to weight gain. Try introducing diet and exercise changes that we discussed today. If you need some help we can consider changing your medication to something less likely to contribute to weight gain.   We completed your HPV vaccine today.   Make an appointment in 2 months for a quick nurse visit for your second meningitis vaccine.   Will see you back in 3 months - we can check labs for you at this visit    Please sign up with MyChart to access your labs and set up email communication with our clinic for non-urgent medical concerns.    Vertigo Vertigo means that you feel like you are moving when you are not. Vertigo can also make you feel like things around you are moving when they are not. This feeling can come and go at any time. Vertigo often goes away on its own. Follow these instructions at home:  Avoid making fast movements.  Avoid driving.  Avoid using heavy machinery.  Avoid doing any task or activity that might cause danger to you or other people if you would have a vertigo attack while you are doing it.  Sit down right away if you feel dizzy or have trouble with your balance.  Take over-the-counter and prescription medicines only as told by your doctor.  Follow instructions from your doctor about which positions or movements you should avoid.  Drink enough fluid to keep your pee (urine) clear or pale yellow.  Keep all follow-up visits as told by your doctor. This is important. Contact a doctor if:  Medicine does not help your vertigo.  You have a fever.  Your problems get worse or you have new symptoms.  Your family or friends see changes in your behavior.  You feel sick to your stomach (nauseous) or you throw up (vomit).  You have a "pins and needles" feeling or you are numb in part of your body. Get help right away if:  You have trouble moving or talking.  You are always dizzy.  You pass out  (faint).  You get very bad headaches.  You feel weak or have trouble using your hands, arms, or legs.  You have changes in your hearing.  You have changes in your seeing (vision).  You get a stiff neck.  Bright light starts to bother you. This information is not intended to replace advice given to you by your health care provider. Make sure you discuss any questions you have with your health care provider. Document Released: 05/24/2008 Document Revised: 01/21/2016 Document Reviewed: 12/08/2014 Elsevier Interactive Patient Education  [employer]2018 Elsevier Inc.

## 2018-02-27 NOTE — Assessment & Plan Note (Signed)
No primary care provider d/t no insurance and unable to afford neurology assessment. I think the severity of his migraines have increased after stopping consistent Effexor - we discussed that this is on his HMAP formulary and will be provided under this program. He was not aware of this until now. I will sent in his Effexor and in 3 months if he is not doing better with decreased migraine frequency we can consider starting a prophylactic medication. Would avoid BP lowering option however with his report of orthostatic hypotension.  Will send in for 9 tablets of sumitriptan for [facility] to [facility] with [facility] coupon for abortive therapy. Counseled.

## 2018-02-27 NOTE — Telephone Encounter (Signed)
Effexor is an option listed that works well with preventing migraines - In looking back I think his migraines were under much better control when he was taking this every day. I [facility] this [facility] [address] for [facility] to cover it. Also sent Trazodone in there as well. If at 3 month follow up of regular use of this medication there is no improvement will add something else.   I can send in Sumatriptan (Imitrex) for him to have on hand to "abort" a migraine should he get a bad one. There is a coupon through [employer] he can download online to where he can get 9 tablets for $15. This is not a preventative medication meant to be taken daily - only when he gets a migraine that hits. Will take one tablet and then repeat in 2 hours IF no desired relief.   Would you mind calling Gerald Lee about migraine plan? Thank you! Can you also find out which [facility] I can sent the Imitrex into?

## 2018-02-27 NOTE — Assessment & Plan Note (Signed)
Wt Readings from Last 3 Encounters:  02/27/18 290 lb (131.5 kg)  12/04/17 268 lb (121.6 kg)  10/13/17 250 lb (113.4 kg)   Discussed today regarding weight gain trend since February of this year. There is some emerging information suggesting possible link with integrase drug class and weight gain - I informed him of this today but ultimately feel this is multifactorial for him. Offered consideration of medication switch. He would like to try to implement dietary and lifestyle changes to see if this can help him reduce his weight. Counseled today on setting small goals after he evaluates his diet. He will focus on reducing portions and eliminating snacks.

## 2018-02-27 NOTE — Assessment & Plan Note (Signed)
He has been very well controlled on Biktarvy and undetectable for nearly a year now. Very proud of how he is doing and discussed labs with him today. He is proud of himself. We will continue this for him for now. If he decides to switch his medication d/t the possibility of weigh gain we can offer him that. Discussed U=U concept in addition to safe sex counseling and prevention of other STIs.  He can return in 3 months to re-evaluate his migraines. Will repeat VL in 6 months.

## 2018-02-28 NOTE — Telephone Encounter (Signed)
Called the patient to let him know that the medication was called in to pharmacy and should be ready for pick up and also explain the Imitraex Rx as the break through. He did not answer the line and was unable to leave a message as his voicemail is not set up.

## 2018-03-02 MED ORDER — SUMATRIPTAN SUCCINATE 50 MG PO TABS: 50.0000 mg | ORAL_TABLET | Freq: Once | ORAL | 2 refills | Status: DC

## 2018-03-02 NOTE — Telephone Encounter (Signed)
rx sent in. Thank you!

## 2018-03-02 NOTE — Addendum Note (Signed)
Addended by: Blanchard KelchIXON, STEPHANIE N on: 03/02/2018 12:16 PM   Modules accepted: Orders

## 2018-03-02 NOTE — Telephone Encounter (Signed)
Patient returned call. RN relayed Stephanie's message, will ask [facility] again about trazodone/effexor (stated that it was not available Wednesday). He will not be able to pick up Imitrex yet, but may be able to get it next week. [facility] for the Imitrex prescription. Andree CossHowell, Michele Kerlin M, RN

## 2018-03-21 ENCOUNTER — Encounter: Payer: Self-pay | Admitting: Infectious Diseases

## 2018-03-30 ENCOUNTER — Encounter (HOSPITAL_COMMUNITY): Payer: Self-pay | Admitting: *Deleted

## 2018-03-30 ENCOUNTER — Emergency Department (HOSPITAL_COMMUNITY)
Admission: EM | Admit: 2018-03-30 | Discharge: 2018-03-30 | Disposition: A | Discharge status: Home / Self Care | Payer: Self-pay | Attending: Emergency Medicine | Admitting: Emergency Medicine

## 2018-03-30 DIAGNOSIS — J45909 Unspecified asthma, uncomplicated: Secondary | ICD-10-CM | POA: Insufficient documentation

## 2018-03-30 DIAGNOSIS — Z79899 Other long term (current) drug therapy: Secondary | ICD-10-CM | POA: Insufficient documentation

## 2018-03-30 DIAGNOSIS — F17210 Nicotine dependence, cigarettes, uncomplicated: Secondary | ICD-10-CM | POA: Insufficient documentation

## 2018-03-30 DIAGNOSIS — G43809 Other migraine, not intractable, without status migrainosus: Secondary | ICD-10-CM | POA: Insufficient documentation

## 2018-03-30 MED ORDER — SODIUM CHLORIDE 0.9 % IV BOLUS
1000.0000 mL | Freq: Once | INTRAVENOUS | Status: AC
  Administered 2018-03-30: 1000 mL via INTRAVENOUS

## 2018-03-30 MED ORDER — PROCHLORPERAZINE EDISYLATE 10 MG/2ML IJ SOLN
10.0000 mg | Freq: Once | INTRAMUSCULAR | Status: AC
  Administered 2018-03-30: 10 mg via INTRAVENOUS
  Filled 2018-03-30: qty 2

## 2018-03-30 MED ORDER — KETOROLAC TROMETHAMINE 30 MG/ML IJ SOLN
30.0000 mg | Freq: Once | INTRAMUSCULAR | Status: AC
  Administered 2018-03-30: 30 mg via INTRAVENOUS
  Filled 2018-03-30: qty 1

## 2018-03-30 MED ORDER — DIPHENHYDRAMINE HCL 50 MG/ML IJ SOLN
25.0000 mg | Freq: Once | INTRAMUSCULAR | Status: AC
Start: 2018-03-30 — End: 2018-03-30
  Administered 2018-03-30: 25 mg via INTRAVENOUS
  Filled 2018-03-30: qty 1

## 2018-03-30 NOTE — ED Triage Notes (Signed)
Pt in c/o migraine since this morning, reports black spots, dizziness and nausea, photophobia, history of same, took ibuprofen this morning with no improvement

## 2018-03-30 NOTE — Discharge Instructions (Signed)
Return to ED for worsening symptoms, head injuries or falls, numbness in your arms or legs, vision loss, neck pain with fever.

## 2018-03-30 NOTE — ED Provider Notes (Signed)
[facility] Provider Note   CSN: [id number] Arrival date & time: 03/30/18  1123     History   Chief Complaint Chief Complaint  Patient presents with  . Migraine    HPI [name] A Gustafson is a 22 y.o. male with a past medical history of HIV (undetectable viral load as of 01/2018, compliant with medication) who presents to ED for evaluation of migraine that began upon waking up this morning approximately 4 hours ago.  He reports photophobia, phonophobia, nausea, seeing black spots.  He has a history of migraines and states that this feels similar.  His migraines are usually triggered by extreme weather such as cold or hot.  He tried taking ibuprofen with only mild improvement in his symptoms.  He does report a family history of migraines and denies any personal family history of aneurysms.  Denies any head injuries or falls, numbness in arms or legs, fever, neck pain, chest pain, shortness of breath.  HPI  Past Medical History:  Diagnosis Date  . Asthma   . Headache(784.0)   . HIV infection [facility](HCC)     Patient Active Problem List   Diagnosis Date Noted  . History of syphilis 07/11/2017  . 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  . HIV (human immunodeficiency virus infection) (HCC) 05/22/2017  . Healthcare maintenance 05/22/2017  . Migraine without aura 03/14/2014  . Obesity, unspecified 03/14/2014  . Insomnia, unspecified 03/14/2014    Past Surgical History:  Procedure Laterality Date  . CIRCUMCISION  1998  . HERNIA REPAIR     Done between the ages of 89 or 8 years   . NO PAST SURGERIES          Home Medications    Prior to Admission medications   Medication Sig Start Date End Date Taking? Authorizing Provider  bictegravir-emtricitabine-tenofovir AF (BIKTARVY) 50-200-25 MG TABS tablet Take 1 tablet by mouth daily. For HIV 12/20/17   Blanchard Kelchixon, Stephanie N, NP  hydrOXYzine (ATARAX/VISTARIL) 25 MG tablet Take  1 tablet (25 mg total) by mouth 3 (three) times daily as needed for anxiety. 06/27/17   Money, Gerlene Burdockravis B, FNP  ondansetron (ZOFRAN) 4 MG tablet Take 1 tablet (4 mg total) by mouth every 12 (twelve) hours as needed (30 minutes before antibiotic if needed to prevent nausea). 08/25/17   Blanchard Kelchixon, Stephanie N, NP  SUMAtriptan (IMITREX) 50 MG tablet Take 1 tablet (50 mg total) by mouth once for 1 dose. May repeat in 2 hours if headache persists or recurs. No more than 4 tablets in 24 hour period. 03/02/18 03/02/18  Blanchard Kelchixon, Stephanie N, NP  traZODone (DESYREL) 50 MG tablet Take 1-2 tablets (50-100 mg total) by mouth at bedtime as needed for sleep. 02/27/18   Blanchard Kelchixon, Stephanie N, NP  venlafaxine XR (EFFEXOR-XR) 75 MG 24 hr capsule Take 1 capsule (75 mg total) by mouth daily with breakfast. For mood control 02/27/18   Blanchard Kelchixon, Stephanie N, NP    Family History Family History  Problem Relation Age of Onset  . Stroke Mother   . Hypertension Mother   . Diabetes Father     Social History Social History   Tobacco Use  . Smoking status: Light Tobacco Smoker    Packs/day: 0.10    Types: Cigarettes, Cigars  . Smokeless tobacco: Never Used  . Tobacco comment: "only when super stressed"  Substance Use Topics  . Alcohol use: Yes    Comment: occ  . Drug use: Yes  Frequency: 3.0 times per week    Types: Marijuana     Allergies   Ibuprofen   Review of Systems Review of Systems  Constitutional: Negative for appetite change, chills and fever.  HENT: Negative for ear pain, rhinorrhea, sneezing and sore throat.   Eyes: Positive for photophobia and visual disturbance.  Respiratory: Negative for cough, chest tightness, shortness of breath and wheezing.   Cardiovascular: Negative for chest pain and palpitations.  Gastrointestinal: Positive for nausea. Negative for abdominal pain, blood in stool, constipation, diarrhea and vomiting.  Genitourinary: Negative for dysuria, hematuria and urgency.  Musculoskeletal:  Negative for myalgias.  Skin: Negative for rash.  Neurological: Positive for headaches. Negative for dizziness, weakness and light-headedness.     Physical Exam Updated Vital Signs BP 121/67 (BP Location: Left Arm)   Pulse 73   Temp 98.5 F (36.9 C) (Oral)   Resp 18   SpO2 99%   Physical Exam  Constitutional: He is oriented to person, place, and time. He appears well-developed and well-nourished. No distress.  HENT:  Head: Normocephalic and atraumatic.  Nose: Nose normal.  Eyes: Pupils are equal, round, and reactive to light. Conjunctivae and EOM are normal. Right eye exhibits no discharge. Left eye exhibits no discharge. No scleral icterus.  Neck: Normal range of motion. Neck supple.  No meningismus.  Cardiovascular: Normal rate, regular rhythm, normal heart sounds and intact distal pulses. Exam reveals no gallop and no friction rub.  No murmur heard. Pulmonary/Chest: Effort normal and breath sounds normal. No respiratory distress.  Abdominal: Soft. Bowel sounds are normal. He exhibits no distension. There is no tenderness. There is no guarding.  Musculoskeletal: Normal range of motion. He exhibits no edema.  Neurological: He is alert and oriented to person, place, and time. No cranial nerve deficit or sensory deficit. He exhibits normal muscle tone. Coordination normal.  Pupils reactive. No facial asymmetry noted. Cranial nerves appear grossly intact. Sensation intact to light touch on face, BUE and BLE. Strength 5/5 in BUE and BLE.   Skin: Skin is warm and dry. No rash noted.  Psychiatric: He has a normal mood and affect.  Nursing note and vitals reviewed.    ED Treatments / Results  Labs (all labs ordered are listed, but only abnormal results are displayed) Labs Reviewed - No data to display  EKG None  Radiology No results found.  Procedures Procedures (including critical care time)  Medications Ordered in ED Medications  sodium chloride 0.9 % bolus 1,000 mL  (1,000 mLs Intravenous New Bag/Given 03/30/18 1410)  prochlorperazine (COMPAZINE) injection 10 mg (10 mg Intravenous Given 03/30/18 1411)  diphenhydrAMINE (BENADRYL) injection 25 mg (25 mg Intravenous Given 03/30/18 1411)  ketorolac (TORADOL) 30 MG/ML injection 30 mg (30 mg Intravenous Given 03/30/18 1411)     Initial Impression / Assessment and Plan / ED Course  I have reviewed the triage vital signs and the nursing notes.  Pertinent labs & imaging results that were available during my care of the patient were reviewed by me and considered in my medical decision making (see chart for details).     22 year old male with past medical history of HIV, compliant with medication, migraine headaches presents to ED for evaluation of migraine that began this morning.  Reports photophobia, phonophobia, seeing black spots, nausea.  These are all typical of usual migraines.  No deficits on neurological exam noted.  No meningismus noted.  He is afebrile.  No personal family history of aneurysms.  Patient was given migraine  cocktail and fluids with significant improvement in his symptoms.  He is requesting discharge home. There are no headache characteristics that are lateralizing or concerning for increased ICP, infectious or vascular cause of his symptoms.    Will advise him to return to ED for any severe worsening symptoms.  Portions of this note were generated with Scientist, clinical (histocompatibility and immunogenetics). Dictation errors may occur despite best attempts at proofreading.   Final Clinical Impressions(s) / ED Diagnoses   Final diagnoses:  Other migraine without status migrainosus, not intractable    ED Discharge Orders    None       Dietrich Pates, PA-C 03/30/18 1442    Margarita Grizzle, MD 03/30/18 1556

## 2018-04-01 ENCOUNTER — Other Ambulatory Visit: Payer: Self-pay | Admitting: Infectious Diseases

## 2018-04-01 DIAGNOSIS — Z21 Asymptomatic human immunodeficiency virus [HIV] infection status: Secondary | ICD-10-CM

## 2018-05-01 ENCOUNTER — Ambulatory Visit: Payer: Self-pay

## 2018-05-03 ENCOUNTER — Telehealth: Payer: Self-pay | Admitting: *Deleted

## 2018-05-03 NOTE — Telephone Encounter (Signed)
Patient called, asked if he could walk in today for STI testing, stating his partner was just treated for gonorrhea at the emergency room.  RN advised that we only test routinely, gave patient options for STI testing in [place]. He will contact [facility], as he is meeting his case manager today. Andree Coss, RN

## 2018-05-15 ENCOUNTER — Other Ambulatory Visit: Payer: Self-pay | Admitting: Infectious Diseases

## 2018-05-15 DIAGNOSIS — Z21 Asymptomatic human immunodeficiency virus [HIV] infection status: Secondary | ICD-10-CM

## 2018-06-11 ENCOUNTER — Ambulatory Visit (INDEPENDENT_AMBULATORY_CARE_PROVIDER_SITE_OTHER): Payer: Self-pay | Admitting: Infectious Diseases

## 2018-06-11 ENCOUNTER — Encounter: Payer: Self-pay | Admitting: Infectious Diseases

## 2018-06-11 VITALS — BP 123/84 | HR 56 | Temp 98.6°F | Wt 309.0 lb

## 2018-06-11 DIAGNOSIS — Z6835 Body mass index (BMI) 35.0-35.9, adult: Secondary | ICD-10-CM

## 2018-06-11 DIAGNOSIS — Z113 Encounter for screening for infections with a predominantly sexual mode of transmission: Secondary | ICD-10-CM

## 2018-06-11 DIAGNOSIS — Z0000 Encounter for general adult medical examination without abnormal findings: Secondary | ICD-10-CM

## 2018-06-11 DIAGNOSIS — M25561 Pain in right knee: Secondary | ICD-10-CM

## 2018-06-11 DIAGNOSIS — Z23 Encounter for immunization: Secondary | ICD-10-CM

## 2018-06-11 DIAGNOSIS — Z21 Asymptomatic human immunodeficiency virus [HIV] infection status: Secondary | ICD-10-CM

## 2018-06-11 DIAGNOSIS — M25562 Pain in left knee: Secondary | ICD-10-CM | POA: Insufficient documentation

## 2018-06-11 DIAGNOSIS — E6609 Other obesity due to excess calories: Secondary | ICD-10-CM

## 2018-06-11 NOTE — Assessment & Plan Note (Signed)
Weight up 64# since November 2018. He would like to continue Biktarvy despite this knowing that it may partly be due to the bictegrivir.

## 2018-06-11 NOTE — Patient Instructions (Addendum)
Wonderful to see you today.   Continue taking your biktarvy every day - will check your labs today.  Please sign up with MyChart to access your labs and set up email communication with our clinic for non-urgent medical concerns and to see your lab results from today.   Will give you your flu shot and second meningitis shot today.   Would like to see you back in 4 months for follow up please.

## 2018-06-11 NOTE — Assessment & Plan Note (Signed)
Joints are not swollen or warm today. Likely d/t increased activity and poor foot-ware. Discussed using inserts for arch support and conservative measures for pain control with rest/ice and tylenol as needed. Will screen for gonorrhea with site specific swabs and serum RPR to exclude STI involvement.

## 2018-06-11 NOTE — Assessment & Plan Note (Addendum)
Flu vaccine today and Menveo #2. HPV series up to date.  Will administer Prevnar vaccine next visit.  Check HBsAb next lab draw for immunity following vaccine series.

## 2018-06-11 NOTE — Assessment & Plan Note (Signed)
Positive exposure treatment history. Will repeat rectal/oropharyngeal and urine GC/C today as well as serum RPR. If titer is positive will need to call [facility] or [facility] where he was tested to compare.

## 2018-06-11 NOTE — Progress Notes (Signed)
Name: Gerald Lee  DOB: [birth year]  MRN: [id number] PCP: [name], MD    Patient Active Problem List   Diagnosis Date Noted  . Screening for STDs (sexually transmitted diseases) 06/11/2018  . History of syphilis 07/11/2017  . 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  . HIV (human immunodeficiency virus infection) (HCC) 05/22/2017  . Healthcare maintenance 05/22/2017  . Migraine without aura 03/14/2014  . Obesity, unspecified 03/14/2014  . Insomnia, unspecified 03/14/2014    Brief Narrative:  Gerald Lee is a 22 y.o. AA male with HIV infection. Diagnosed September 2018 with CD4 nadir 780. Started immediately on Biktarvy. Also diagnosed with syphilis at the same time. Was assessed for neurosyphilis with severe headaches but LP negative. High Risk: MSM. Previous OIs: none  Previous Regimens:   Biktarvy 2018 --> suppressed   Genotype:   02/2017 - no mutations   Subjective:   Chief Complaint  Patient presents with  . Follow-up    Gerald Lee is feeling well today but just tired. He has started a new job recently at [employer]. He tells me he has been sick the last time we saw each other- he had some STI exposures (gonorrhea/chlamydia syphilis) with open relationship and new partner between he and his fiance. Did not know that these infections could be passed back and forth and that it could be asymptomatic in some people for a period of time. He [facility] - received 3 shots and 4 pills. Would like test of cure today.   Only concern today is bilateral knee pain which he is wondering due to HIV or STD. He also tells me it may be due to wearing high-heels/dancing as well as a more active job. No falls or swelling in knees only describes stiffness/ache.   He has missed a few doses of his Biktarvy due to external stressors with losing his job. He has no concerns with medication s/e or access (up to date on RW/[facility]  applications). Reports no complaints today suggestive of associated opportunistic infection or advancing HIV disease such as fevers, night sweats, weight loss, anorexia, cough, SOB, nausea, vomiting, diarrhea, headache, sensory changes, lymphadenopathy or oral thrush. He is meeting with Gerald Lee today.   Review of Systems  Constitutional: Negative for chills, diaphoresis, fever, malaise/fatigue and weight loss.  HENT: Negative for congestion, ear pain, sinus pain and sore throat.        No dental problems  Eyes: Negative for blurred vision, discharge and redness.  Respiratory: Negative for cough and sputum production.   Cardiovascular: Negative for chest pain and leg swelling.  Gastrointestinal: Negative for abdominal pain, diarrhea and vomiting.  Genitourinary: Negative for dysuria and flank pain.  Musculoskeletal: Negative for back pain, joint pain, myalgias and neck pain.  Skin: Negative for rash.  Neurological: Positive for headaches. Negative for dizziness and tingling.  Psychiatric/Behavioral: Negative for depression and substance abuse. The patient is not nervous/anxious and does not have insomnia.     Past Medical History:  Diagnosis Date  . Asthma   . Headache(784.0)   . HIV infection (HCC)     Social History   Tobacco Use  . Smoking status: Light Tobacco Smoker    Packs/day: 0.10    Types: Cigarettes, Cigars  . Smokeless tobacco: Never Used  . Tobacco comment: "only when super stressed"  Substance Use Topics  . Alcohol use: Yes    Comment: occ  . Drug use: Yes  Frequency: 3.0 times per week    Types: Marijuana    Family History  Problem Relation Age of Onset  . Stroke Mother   . Hypertension Mother   . Diabetes Father     Allergies  Allergen Reactions  . Ibuprofen Other (See Comments)    Per patient "can not take because it will shut down my kidneys"    Objective:  Vitals:   06/11/18 1000  BP: 123/84  Pulse: (!) 56  Temp: 98.6 F (37 C)  TempSrc:  Oral  Weight: (!) 309 lb (140.2 kg)   Body mass index is 37.61 kg/m.  Physical Exam  Constitutional: He is oriented to person, place, and time and well-developed, well-nourished, and in no distress.  Pleasant and in good spirits. Appears tired today.   HENT:  Mouth/Throat: No oral lesions. Normal dentition. No dental caries.  Eyes: No scleral icterus.  Cardiovascular: Normal rate, regular rhythm and normal heart sounds.  Pulmonary/Chest: Effort normal and breath sounds normal.  Abdominal: Soft. He exhibits no distension. There is no tenderness.  Lymphadenopathy:    He has no cervical adenopathy.  Neurological: He is alert and oriented to person, place, and time.  Skin: Skin is warm and dry. No rash noted.  Psychiatric: Mood and affect normal.   Lab Results Lab Results  Component Value Date   WBC 6.4 02/16/2018   HGB 14.3 02/16/2018   HCT 42.6 02/16/2018   MCV 84.0 02/16/2018   PLT 239 02/16/2018    Lab Results  Component Value Date   CREATININE 1.40 (H) 02/16/2018   BUN 13 02/16/2018   NA 138 02/16/2018   K 4.4 02/16/2018   CL 104 02/16/2018   CO2 24 02/16/2018    Lab Results  Component Value Date   ALT 13 02/16/2018   AST 16 02/16/2018   ALKPHOS 87 01/03/2018   BILITOT 0.5 02/16/2018    Lab Results  Component Value Date   CHOL 109 02/16/2018   HDL 40 (L) 02/16/2018   LDLCALC 57 02/16/2018   TRIG 42 02/16/2018   CHOLHDL 2.7 02/16/2018   HIV 1 RNA Quant (copies/mL)  Date Value  02/16/2018 <20 NOT DETECTED  11/20/2017 <20 NOT DETECTED  08/15/2017 <20 NOT DETECTED   CD4 T Cell Abs (/uL)  Date Value  02/16/2018 1,260  11/20/2017 1,450  05/17/2017 770   Lab Results  Component Value Date   HAV REACTIVE (A) 05/22/2017   Lab Results  Component Value Date   HEPBSAG NON-REACTIVE 05/22/2017   HEPBSAB NON-REACTIVE 05/22/2017    Problem List Items Addressed This Visit      Unprioritized   Healthcare maintenance    Flu vaccine today and Menveo #2. HPV  series up to date.  Will administer Prevnar vaccine next visit.  Check HBsAb next lab draw for immunity following vaccine series.       HIV (human immunodeficiency virus infection) (HCC) - Primary (Chronic)    Missed a few doses and was counseled on importance of taking medications daily. Will check VL/CD4 knowing he may have some low level virus today. He will return in 4 months for routine care. Safe sex counseling provided today.       Relevant Orders   HIV-1 RNA quant-no reflex-bld   T-helper cell (CD4)- ([facility] clinic only)   Obesity, unspecified (Chronic)    Weight up 64# since November 2018. He would like to continue Biktarvy despite this knowing that it may partly be due to the bictegrivir.  Screening for STDs (sexually transmitted diseases)    Positive exposure treatment history. Will repeat rectal/oropharyngeal and urine GC/C today as well as serum RPR. If titer is positive will need to call [facility] or [facility] where he was tested to compare.        Other Visit Diagnoses    Screen for STD (sexually transmitted disease)       Relevant Orders   Urine cytology ancillary only   Cytology (oral, anal, urethral) ancillary only   RPR   Cytology (oral, anal, urethral) ancillary only      No orders of the defined types were placed in this encounter.  Return in about 4 months (around 10/12/2018).   Rexene Alberts, MSN, NP-C [facility] for Infectious Disease [facility] Pager: [phone]  06/11/18 10:19 AM

## 2018-06-11 NOTE — Assessment & Plan Note (Signed)
Missed a few doses and was counseled on importance of taking medications daily. Will check VL/CD4 knowing he may have some low level virus today. He will return in 4 months for routine care. Safe sex counseling provided today.

## 2018-06-12 LAB — T-HELPER CELL (CD4) - (RCID CLINIC ONLY)
CD4 T CELL HELPER: 37 % (ref 33–55)
CD4 T Cell Abs: 1250 /uL (ref 400–2700)

## 2018-06-12 LAB — CYTOLOGY, (ORAL, ANAL, URETHRAL) ANCILLARY ONLY
CHLAMYDIA, DNA PROBE: NEGATIVE
CHLAMYDIA, DNA PROBE: NEGATIVE
NEISSERIA GONORRHEA: NEGATIVE
NEISSERIA GONORRHEA: NEGATIVE

## 2018-06-12 LAB — URINE CYTOLOGY ANCILLARY ONLY
Chlamydia: NEGATIVE
Neisseria Gonorrhea: NEGATIVE

## 2018-06-12 NOTE — Addendum Note (Signed)
Addended by: Lurlean Leyden on: 06/12/2018 03:58 PM   Modules accepted: Orders

## 2018-06-15 LAB — HIV-1 RNA QUANT-NO REFLEX-BLD
HIV 1 RNA QUANT: NOT DETECTED {copies}/mL
HIV-1 RNA Quant, Log: <1.3 Log copies/mL

## 2018-06-15 LAB — RPR: RPR Ser Ql: NONREACTIVE

## 2018-08-26 ENCOUNTER — Other Ambulatory Visit: Payer: Self-pay

## 2018-08-26 ENCOUNTER — Emergency Department (HOSPITAL_COMMUNITY)
Admission: EM | Admit: 2018-08-26 | Discharge: 2018-08-26 | Disposition: A | Discharge status: Home / Self Care | Payer: Self-pay | Attending: Emergency Medicine | Admitting: Emergency Medicine

## 2018-08-26 DIAGNOSIS — J101 Influenza due to other identified influenza virus with other respiratory manifestations: Secondary | ICD-10-CM | POA: Insufficient documentation

## 2018-08-26 DIAGNOSIS — J45909 Unspecified asthma, uncomplicated: Secondary | ICD-10-CM | POA: Insufficient documentation

## 2018-08-26 DIAGNOSIS — Z79899 Other long term (current) drug therapy: Secondary | ICD-10-CM | POA: Insufficient documentation

## 2018-08-26 DIAGNOSIS — R6889 Other general symptoms and signs: Secondary | ICD-10-CM

## 2018-08-26 DIAGNOSIS — F17210 Nicotine dependence, cigarettes, uncomplicated: Secondary | ICD-10-CM | POA: Insufficient documentation

## 2018-08-26 MED ORDER — OSELTAMIVIR PHOSPHATE 75 MG PO CAPS: 75.0000 mg | ORAL_CAPSULE | Freq: Two times a day (BID) | ORAL | 0 refills | Status: DC

## 2018-08-26 NOTE — ED Triage Notes (Signed)
Pt to ED via EMS with palpatations since 600 this morning. Pt woke up, palpatations started, went back to bed, he woke up 20 minutes ago and the palpatations were still there. Pt developed cough today and sts he's having trouble swallowing his mucous.

## 2018-08-26 NOTE — ED Provider Notes (Signed)
[facility] EMERGENCY DEPARTMENT Provider Note   CSN: [id number] Arrival date & time: 08/26/18  1740     History   Chief Complaint Chief Complaint  Patient presents with  . Palpitations    HPI Gerald Lee is a 22 y.o. male.  The history is provided by the patient. No language interpreter was used.  Palpitations       22 year old male with history of HIV, asthma, recurrent headache presenting with complaints of heart palpitation. Pt brought here via EMS.  Patient report woke up this morning with sensation of heart racing, body aches, throat irritation, and congestion.  He went back to sleep, woke up again later on and symptoms are persistent.  Endorsed generalized weakness, nonproductive cough and having trouble swallowing his mucus.  He mention having history of HIV and is immunocompromise and decided to come here for evaluation.  Patient mentioned that he is well controlled with his HIV with undetectable viral load and high CD4 count.  He denies any nausea vomiting diarrhea or shortness of breath.  Denies any dysuria.  No rash.  He has not had his flu shot.  Past Medical History:  Diagnosis Date  . Asthma   . Headache(784.0)   . HIV infection [facility](HCC)     Patient Active Problem List   Diagnosis Date Noted  . Screening for STDs (sexually transmitted diseases) 06/11/2018  . Knee pain, bilateral 06/11/2018  . History of syphilis 07/11/2017  . 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  . HIV (human immunodeficiency virus infection) (HCC) 05/22/2017  . Healthcare maintenance 05/22/2017  . Migraine without aura 03/14/2014  . Obesity, unspecified 03/14/2014  . Insomnia, unspecified 03/14/2014    Past Surgical History:  Procedure Laterality Date  . CIRCUMCISION  1998  . HERNIA REPAIR     Done between the ages of 89 or 8 years   . NO PAST SURGERIES          Home Medications    Prior to Admission medications     Medication Sig Start Date End Date Taking? Authorizing Provider  BIKTARVY 50-200-25 MG TABS tablet TAKE 1 TABLET BY MOUTH DAILY 05/15/18   Blanchard Kelchixon, Stephanie N, NP  hydrOXYzine (ATARAX/VISTARIL) 25 MG tablet Take 1 tablet (25 mg total) by mouth 3 (three) times daily as needed for anxiety. Patient not taking: Reported on 06/11/2018 06/27/17   Money, Gerlene Burdockravis B, FNP  ondansetron (ZOFRAN) 4 MG tablet Take 1 tablet (4 mg total) by mouth every 12 (twelve) hours as needed (30 minutes before antibiotic if needed to prevent nausea). 08/25/17   Blanchard Kelchixon, Stephanie N, NP  SUMAtriptan (IMITREX) 50 MG tablet Take 1 tablet (50 mg total) by mouth once for 1 dose. May repeat in 2 hours if headache persists or recurs. No more than 4 tablets in 24 hour period. 03/02/18 03/02/18  Blanchard Kelchixon, Stephanie N, NP  traZODone (DESYREL) 50 MG tablet Take 1-2 tablets (50-100 mg total) by mouth at bedtime as needed for sleep. 02/27/18   Blanchard Kelchixon, Stephanie N, NP  venlafaxine XR (EFFEXOR-XR) 75 MG 24 hr capsule Take 1 capsule (75 mg total) by mouth daily with breakfast. For mood control 02/27/18   Blanchard Kelchixon, Stephanie N, NP    Family History Family History  Problem Relation Age of Onset  . Stroke Mother   . Hypertension Mother   . Diabetes Father     Social History Social History   Tobacco Use  . Smoking status: Light Tobacco Smoker  Packs/day: 0.10    Types: Cigarettes, Cigars  . Smokeless tobacco: Never Used  . Tobacco comment: "only when super stressed"  Substance Use Topics  . Alcohol use: Yes    Comment: occ  . Drug use: Yes    Frequency: 3.0 times per week    Types: Marijuana     Allergies   Ibuprofen   Review of Systems Review of Systems  Cardiovascular: Positive for palpitations.  All other systems reviewed and are negative.    Physical Exam Updated Vital Signs BP 124/77 (BP Location: Right Arm)   Pulse (!) 105   Temp 99.2 F (37.3 C) (Oral)   Resp 18   Ht 6\' 4"  (1.93 m)   SpO2 98%   BMI 37.61 kg/m    Physical Exam Vitals signs and nursing note reviewed.  Constitutional:      General: He is not in acute distress.    Appearance: He is well-developed.  HENT:     Head: Atraumatic.     Comments: Ears: TMs normal bilaterally Nose: Normal nares Throat: Uvula midline no tonsillar edema no exudates no trismus  Eyes:     Conjunctiva/sclera: Conjunctivae normal.  Neck:     Musculoskeletal: Neck supple.  Cardiovascular:     Rate and Rhythm: Tachycardia present.     Pulses: Normal pulses.     Heart sounds: Normal heart sounds.  Pulmonary:     Effort: Pulmonary effort is normal.     Breath sounds: Normal breath sounds. No wheezing, rhonchi or rales.  Abdominal:     Palpations: Abdomen is soft.     Tenderness: There is no abdominal tenderness.  Skin:    Findings: No rash.  Neurological:     Mental Status: He is alert and oriented to person, place, and time.      ED Treatments / Results  Labs (all labs ordered are listed, but only abnormal results are displayed) Labs Reviewed - No data to display  EKG None   Date: 08/26/2018  Rate: 101  Rhythm: sinus tachycardia  QRS Axis: normal  Intervals: normal  ST/T Wave abnormalities: normal  Conduction Disutrbances: none  Narrative Interpretation:   Old EKG Reviewed: No significant changes noted     Radiology No results found.  Procedures Procedures (including critical care time)  Medications Ordered in ED Medications - No data to display   Initial Impression / Assessment and Plan / ED Course  I have reviewed the triage vital signs and the nursing notes.  Pertinent labs & imaging results that were available during my care of the patient were reviewed by me and considered in my medical decision making (see chart for details).    BP 124/77 (BP Location: Right Arm)   Pulse (!) 105   Temp 99.2 F (37.3 C) (Oral)   Resp 18   Ht 6\' 4"  (1.93 m)   SpO2 98%   BMI 37.61 kg/m    Final Clinical Impressions(s) / ED  Diagnoses   Final diagnoses:  Flu-like symptoms    ED Discharge Orders         Ordered    oseltamivir (TAMIFLU) 75 MG capsule  Every 12 hours     08/26/18 1800         Patient with symptoms consistent with influenza.  Vitals are stable, low-grade fever.  No signs of dehydration, tolerating PO's.  Lungs are clear. Due to patient's presentation and physical exam a chest x-ray was not ordered bc likely diagnosis of flu.  Discussed the cost versus benefit of Tamiflu treatment with the patient.  The patient understands that symptoms are greater than the recommended 24-48 hour window of treatment.  Patient will be discharged with instructions to orally hydrate, rest, and use over-the-counter medications such as anti-inflammatories ibuprofen and Aleve for muscle aches and Tylenol for fever.  Patient will also be given a cough suppressant.     Fayrene Helper, PA-C 08/26/18 1801    Gwyneth Sprout, MD 08/26/18 Ernestina [place]

## 2018-09-04 ENCOUNTER — Ambulatory Visit: Payer: Self-pay

## 2018-10-01 ENCOUNTER — Emergency Department (HOSPITAL_COMMUNITY)
Admission: EM | Admit: 2018-10-01 | Discharge: 2018-10-01 | Disposition: A | Discharge status: Home / Self Care | Payer: Medicaid Other | Attending: Emergency Medicine | Admitting: Emergency Medicine

## 2018-10-01 ENCOUNTER — Emergency Department (HOSPITAL_COMMUNITY): Payer: Medicaid Other

## 2018-10-01 DIAGNOSIS — F17210 Nicotine dependence, cigarettes, uncomplicated: Secondary | ICD-10-CM | POA: Insufficient documentation

## 2018-10-01 DIAGNOSIS — R059 Cough, unspecified: Secondary | ICD-10-CM

## 2018-10-01 DIAGNOSIS — R05 Cough: Secondary | ICD-10-CM | POA: Diagnosis not present

## 2018-10-01 DIAGNOSIS — J45909 Unspecified asthma, uncomplicated: Secondary | ICD-10-CM | POA: Diagnosis not present

## 2018-10-01 DIAGNOSIS — N390 Urinary tract infection, site not specified: Secondary | ICD-10-CM

## 2018-10-01 DIAGNOSIS — N342 Other urethritis: Secondary | ICD-10-CM

## 2018-10-01 DIAGNOSIS — Z79899 Other long term (current) drug therapy: Secondary | ICD-10-CM | POA: Insufficient documentation

## 2018-10-01 LAB — URINALYSIS, ROUTINE W REFLEX MICROSCOPIC
Glucose, UA: NEGATIVE mg/dL
Hgb urine dipstick: NEGATIVE
Ketones, ur: NEGATIVE mg/dL
NITRITE: NEGATIVE
PROTEIN: 30 mg/dL — AB
SPECIFIC GRAVITY, URINE: 1.03 (ref 1.005–1.030)
pH: 6 (ref 5.0–8.0)

## 2018-10-01 MED ORDER — AZITHROMYCIN 250 MG PO TABS
1000.0000 mg | ORAL_TABLET | Freq: Once | ORAL | Status: AC
Start: 2018-10-01 — End: 2018-10-01
  Administered 2018-10-01: 1000 mg via ORAL
  Filled 2018-10-01: qty 4

## 2018-10-01 MED ORDER — CEFTRIAXONE SODIUM 1 G IJ SOLR
1.0000 g | Freq: Once | INTRAMUSCULAR | Status: AC
  Administered 2018-10-01: 1 g via INTRAMUSCULAR
  Filled 2018-10-01: qty 10

## 2018-10-01 MED ORDER — STERILE WATER FOR INJECTION IJ SOLN
INTRAMUSCULAR | Status: AC
  Administered 2018-10-01: 18:00:00
  Filled 2018-10-01: qty 10

## 2018-10-01 NOTE — Discharge Instructions (Addendum)
Continue taking all medication Return if worse at any time Do not have unprotected sex

## 2018-10-01 NOTE — ED Triage Notes (Signed)
Pt reports cough x 1 week that is productive and green, also reports penile discharge and burning.

## 2018-10-01 NOTE — ED Provider Notes (Signed)
[facility] EMERGENCY DEPARTMENT Provider Note   CSN: [id number] Arrival date & time: 10/01/18  1522     History   Chief Complaint Chief Complaint  Patient presents with  . Cough  . Urinary Tract Infection    HPI Gerald Lee is a 23 y.o. male.  HPI [age]-year-old male history of asthma and HIV infection presents today complaining of cough for 1 week.  He states he had the flu the beginning of January and began having some cough and nasal congestion last week.  He has not definitively had a fever.  He is not feeling short of breath. He also states that he has had some burning at the end of his penis.  Feels like it is with urination.  He also has had unprotected sexual intercourse and has been treated in the past for STIs.  He states that he is taking his HIV medications as prescribed.  He reports that his viral load is low and review previous labs showed on October 14 HIV-1 RNA quantitative was not detected Past Medical History:  Diagnosis Date  . Asthma   . Headache(784.0)   . HIV infection [facility])     Patient Active Problem List   Diagnosis Date Noted  . Screening for STDs (sexually transmitted diseases) 06/11/2018  . Knee pain, bilateral 06/11/2018  . History of syphilis 07/11/2017  . 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  . HIV (human immunodeficiency virus infection) (HCC) 05/22/2017  . Healthcare maintenance 05/22/2017  . Migraine without aura 03/14/2014  . Obesity, unspecified 03/14/2014  . Insomnia, unspecified 03/14/2014    Past Surgical History:  Procedure Laterality Date  . CIRCUMCISION  1998  . HERNIA REPAIR     Done between the ages of 70 or 8 years   . NO PAST SURGERIES          Home Medications    Prior to Admission medications   Medication Sig Start Date End Date Taking? Authorizing Provider  BIKTARVY 50-200-25 MG TABS tablet TAKE 1 TABLET BY MOUTH DAILY 05/15/18   Blanchard Kelch, NP    hydrOXYzine (ATARAX/VISTARIL) 25 MG tablet Take 1 tablet (25 mg total) by mouth 3 (three) times daily as needed for anxiety. Patient not taking: Reported on 06/11/2018 06/27/17   Money, Gerlene Burdock, FNP  ondansetron (ZOFRAN) 4 MG tablet Take 1 tablet (4 mg total) by mouth every 12 (twelve) hours as needed (30 minutes before antibiotic if needed to prevent nausea). 08/25/17   Blanchard Kelch, NP  oseltamivir (TAMIFLU) 75 MG capsule Take 1 capsule (75 mg total) by mouth every 12 (twelve) hours. 08/26/18   Fayrene Helper, PA-C  SUMAtriptan (IMITREX) 50 MG tablet Take 1 tablet (50 mg total) by mouth once for 1 dose. May repeat in 2 hours if headache persists or recurs. No more than 4 tablets in 24 hour period. 03/02/18 03/02/18  Blanchard Kelch, NP  traZODone (DESYREL) 50 MG tablet Take 1-2 tablets (50-100 mg total) by mouth at bedtime as needed for sleep. 02/27/18   Blanchard Kelch, NP  venlafaxine XR (EFFEXOR-XR) 75 MG 24 hr capsule Take 1 capsule (75 mg total) by mouth daily with breakfast. For mood control 02/27/18   Blanchard Kelch, NP    Family History Family History  Problem Relation Age of Onset  . Stroke Mother   . Hypertension Mother   . Diabetes Father     Social History Social History   Tobacco  Use  . Smoking status: Light Tobacco Smoker    Packs/day: 0.10    Types: Cigarettes, Cigars  . Smokeless tobacco: Never Used  . Tobacco comment: "only when super stressed"  Substance Use Topics  . Alcohol use: Yes    Comment: occ  . Drug use: Yes    Frequency: 3.0 times per week    Types: Marijuana     Allergies   Ibuprofen   Review of Systems Review of Systems  All other systems reviewed and are negative.    Physical Exam Updated Vital Signs BP 124/66 (BP Location: Right Arm)   Pulse 92   Temp 98.4 F (36.9 C) (Oral)   Resp 18   SpO2 99%   Physical Exam Vitals signs and nursing note reviewed.  Constitutional:      Appearance: He is obese.  HENT:     Head:  Normocephalic and atraumatic.     Right Ear: Tympanic membrane normal.     Left Ear: Tympanic membrane normal.     Nose: Nose normal.     Mouth/Throat:     Mouth: Mucous membranes are moist.  Eyes:     Pupils: Pupils are equal, round, and reactive to light.  Neck:     Musculoskeletal: Normal range of motion.  Cardiovascular:     Rate and Rhythm: Normal rate and regular rhythm.     Pulses: Normal pulses.  Pulmonary:     Effort: Pulmonary effort is normal.     Comments: Few scattered rhonchi no wheezing no increased work of breathing no rales Abdominal:     General: Abdomen is flat.     Palpations: Abdomen is soft.  Genitourinary:    Penis: Normal.   Musculoskeletal: Normal range of motion.  Skin:    General: Skin is warm.     Capillary Refill: Capillary refill takes less than 2 seconds.  Neurological:     General: No focal deficit present.     Mental Status: He is alert.  Psychiatric:        Mood and Affect: Mood normal.      ED Treatments / Results  Labs (all labs ordered are listed, but only abnormal results are displayed) Labs Reviewed  URINALYSIS, ROUTINE W REFLEX MICROSCOPIC - Abnormal; Notable for the following components:      Result Value   APPearance HAZY (*)    Bilirubin Urine SMALL (*)    Protein, ur 30 (*)    Leukocytes, UA MODERATE (*)    Bacteria, UA RARE (*)    All other components within normal limits  URINE CULTURE  GC/CHLAMYDIA PROBE AMP () NOT [facility]    EKG None  Radiology Dg Chest 2 View  Result Date: 10/01/2018 CLINICAL DATA:  Cough and chest tightness EXAM: CHEST - 2 VIEW COMPARISON:  August 15, 2017 FINDINGS: Lungs are clear. Heart size and pulmonary vascularity are normal. No adenopathy. No pneumothorax. No bone lesions. IMPRESSION: No edema or consolidation. Electronically Signed   By: Bretta BangWilliam  Woodruff III M.D.   On: 10/01/2018 16:21    Procedures Procedures (including critical care time)  Medications Ordered in  ED Medications  cefTRIAXone (ROCEPHIN) injection 1 g (has no administration in time range)  azithromycin (ZITHROMAX) tablet 1,000 mg (has no administration in time range)     Initial Impression / Assessment and Plan / ED Course  I have reviewed the triage vital signs and the nursing notes.  Pertinent labs & imaging results that were available during my  care of the patient were reviewed by me and considered in my medical decision making (see chart for details).     Patient presents complaining of burning with urination.  Urine is consistent with possible UTI versus urethritis.  Patient had urethral swabs obtained and is treated here with 1 g of Rocephin and Zithromax Patient also is complaining of some cough.  Chest x-Rozalynn Buege is clear and he is afebrile.  Final Clinical Impressions(s) / ED Diagnoses   Final diagnoses:  Cough  Urethritis  Urinary tract infection without hematuria, site unspecified    ED Discharge Orders    None       Margarita Grizzle, MD 10/01/18 1731

## 2018-10-01 NOTE — ED Notes (Signed)
Patient verbalizes understanding of discharge instructions. Opportunity for questioning and answers were provided. Armband removed by staff, pt discharged from ED.  

## 2018-10-02 LAB — GC/CHLAMYDIA PROBE AMP (~~LOC~~) NOT AT ARMC
Chlamydia: NEGATIVE
Neisseria Gonorrhea: POSITIVE — AB

## 2018-10-02 LAB — URINE CULTURE: Culture: NO GROWTH

## 2018-10-08 ENCOUNTER — Telehealth: Payer: Self-pay | Admitting: *Deleted

## 2018-10-08 NOTE — Telephone Encounter (Signed)
Received paperwork from [employer] representative. She is unable to fill out SSA form - Medical report on adult with allegation of HIV infection.  Copy made, kept in triage. Original placed in Stephanie's box. Andree CossHowell, Sufyaan Palma M, RN

## 2018-10-09 NOTE — Telephone Encounter (Signed)
Based on review of criteria Gerald Lee will not be eligible for disability r/t his HIV infection. Fortunately he has been very well maintained on his medications and staying very healthy. Half of the 2nd page is not legible.  I still have the form in my box for him if he would like to submit but it will not be approved.  Thank you

## 2018-10-19 NOTE — Telephone Encounter (Signed)
Patient is scheduled 2/25 to renew his RW/ADAP application. RN made note to have him follow up with me to relay information, ask how he would like to proceed.

## 2018-10-23 ENCOUNTER — Ambulatory Visit: Payer: Medicaid Other

## 2018-10-29 ENCOUNTER — Other Ambulatory Visit: Payer: Self-pay

## 2018-10-29 ENCOUNTER — Ambulatory Visit (INDEPENDENT_AMBULATORY_CARE_PROVIDER_SITE_OTHER): Payer: Medicaid Other | Admitting: Infectious Diseases

## 2018-10-29 ENCOUNTER — Encounter: Payer: Self-pay | Admitting: Infectious Diseases

## 2018-10-29 VITALS — BP 122/80 | HR 82 | Temp 98.0°F | Ht 76.0 in | Wt 298.0 lb

## 2018-10-29 DIAGNOSIS — Z23 Encounter for immunization: Secondary | ICD-10-CM

## 2018-10-29 DIAGNOSIS — Z21 Asymptomatic human immunodeficiency virus [HIV] infection status: Secondary | ICD-10-CM

## 2018-10-29 DIAGNOSIS — Z886 Allergy status to analgesic agent status: Secondary | ICD-10-CM

## 2018-10-29 DIAGNOSIS — Z9229 Personal history of other drug therapy: Secondary | ICD-10-CM | POA: Insufficient documentation

## 2018-10-29 DIAGNOSIS — Z720 Tobacco use: Secondary | ICD-10-CM

## 2018-10-29 DIAGNOSIS — F331 Major depressive disorder, recurrent, moderate: Secondary | ICD-10-CM

## 2018-10-29 DIAGNOSIS — Z79899 Other long term (current) drug therapy: Secondary | ICD-10-CM

## 2018-10-29 DIAGNOSIS — Z113 Encounter for screening for infections with a predominantly sexual mode of transmission: Secondary | ICD-10-CM

## 2018-10-29 NOTE — Patient Instructions (Addendum)
Wonderful to see you!   Will check your hiv viral load today and your syphilis test.   Please sign up with MyChart to access your labs and set up email communication with our clinic for non-urgent medical concerns.   Until your pharmacy can fill your Biktarvy again we will give you Dovato to take once a day to keep you going on medications. This is a great one pill once a day option that has 2 similar medications to [place] that I think will work very well for you.   Will have you return in 4 months again. Can do labs 2 weeks before if you are able so we have results to discuss in hand.   Vaccines today: prevnar (pneumonia vaccine).   Please call [facility] @ [phone] to schedule a dental appointment

## 2018-10-29 NOTE — Assessment & Plan Note (Signed)
Prevnar vaccine today.  

## 2018-10-29 NOTE — Assessment & Plan Note (Signed)
Reviewed + gonorrhea labs from February. His symptoms have resolved following treatment and has had no new partners/encounters since. Will check RPR today with high risk sexual behavior.

## 2018-10-29 NOTE — Assessment & Plan Note (Signed)
Well maintained on current medications. Managed by mental health provider.

## 2018-10-29 NOTE — Progress Notes (Signed)
Name: Gerald Lee  DOB: 06-Jun-1996  MRN: [id number] PCP: [name], MD    Patient Active Problem List   Diagnosis Date Noted  . 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 Need for prophylactic vaccination against Streptococcus pneumoniae (pneumococcus) 10/29/2018  . Screening for STDs (sexually transmitted diseases) 06/11/2018  . History of syphilis 07/11/2017  . 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  . HIV (human immunodeficiency virus infection) (HCC) 05/22/2017  . Healthcare maintenance 05/22/2017  . Migraine without aura 03/14/2014  . Obesity, unspecified 03/14/2014  . Insomnia, unspecified 03/14/2014    Brief Narrative:  Gerald Lee is a 23 y.o. AA male with HIV infection. Diagnosed September 2018 with CD4 nadir 780. Started immediately on Biktarvy. High Risk: MSM. Previous OIs: none  Previous Regimens:   Biktarvy 2018 --> suppressed   Genotype:   02/2017 - no mutations   Subjective:  CC: HIV care and follow up. No complaints. Interval history noted for flu in December 19 and urethritis c/w gonorrhea infection in February 2020 s/p treatment.   HPI:  Gerald Lee is doing very well. 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 He has started losing some weight doing this work which he is happy about.   He has been taking his Biktarvy faithfully every day up until 1 week ago when he moved back in with his mom and lost the bottle in transit. Awaiting to hear back from insurance about early dispense. He has had several new sexual partners over the last 3 months since his break up and treated for gonorrhea with resolution of urethritis symptoms a few weeks ago. Condom use has been inconsistent. Since he stopped daily medication he has not  engaged in sex with anyone knowing "the rules" about U=U and concern over transmission in this setting. Reports no complaints today suggestive of associated opportunistic infection or advancing HIV disease such as fevers, night sweats, weight loss, anorexia, cough, SOB, nausea, vomiting, diarrhea, headache, sensory changes, lymphadenopathy or oral thrush.    Review of Systems  Constitutional: Negative for chills, diaphoresis, fever, malaise/fatigue and weight loss.  HENT: Negative for congestion, ear pain, sinus pain and sore throat.        No dental problems - in need of cleaning.   Eyes: Negative for blurred vision, discharge and redness.  Respiratory: Negative for cough and sputum production.   Cardiovascular: Negative for chest pain and leg swelling.  Gastrointestinal: Negative for abdominal pain, diarrhea and vomiting.  Genitourinary: Negative for dysuria and flank pain.  Musculoskeletal: Negative for back pain, joint pain, myalgias and neck pain.  Skin: Negative for rash.  Neurological: Negative for dizziness, tingling and headaches.  Psychiatric/Behavioral: Negative for depression and substance abuse. The patient is not nervous/anxious and does not have insomnia.     Past Medical History:  Diagnosis Date  . Asthma   . Headache(784.0)   . HIV infection (HCC)     Social History   Tobacco Use  . Smoking status: Light Tobacco Smoker    Packs/day: 0.10    Types: Cigarettes, Cigars  . Smokeless tobacco: Never Used  . Tobacco comment: "only when super stressed"  Substance Use Topics  . Alcohol use: Yes    Comment: occ  . Drug use: Yes    Frequency: 7.0 times  per week    Types: Marijuana    Allergies  Allergen Reactions  . Ibuprofen Other (See Comments)    Per patient "can not take because it will shut down my kidneys"    Objective:  Vitals:   10/29/18 1059  BP: 122/80  Pulse: 82  Temp: 98 F (36.7 C)  Weight: 298 lb (135.2 kg)  Height: 6\' 4"  (1.93 m)   Body  mass index is 36.27 kg/m.  Physical Exam  Constitutional: He is oriented to person, place, and time and well-developed, well-nourished, and in no distress.  Pleasant and in good spirits. Appears tired today.   HENT:  Mouth/Throat: No oral lesions. Normal dentition. No dental caries.  Eyes: No scleral icterus.  Cardiovascular: Normal rate, regular rhythm and normal heart sounds.  Pulmonary/Chest: Effort normal and breath sounds normal.  Abdominal: Soft. He exhibits no distension. There is no abdominal tenderness.  Lymphadenopathy:    He has no cervical adenopathy.  Neurological: He is alert and oriented to person, place, and time.  Skin: Skin is warm and dry. No rash noted.  Psychiatric: Mood and affect normal.   Lab Results Lab Results  Component Value Date   WBC 6.4 02/16/2018   HGB 14.3 02/16/2018   HCT 42.6 02/16/2018   MCV 84.0 02/16/2018   PLT 239 02/16/2018    Lab Results  Component Value Date   CREATININE 1.40 (H) 02/16/2018   BUN 13 02/16/2018   NA 138 02/16/2018   K 4.4 02/16/2018   CL 104 02/16/2018   CO2 24 02/16/2018    Lab Results  Component Value Date   ALT 13 02/16/2018   AST 16 02/16/2018   ALKPHOS 87 01/03/2018   BILITOT 0.5 02/16/2018    Lab Results  Component Value Date   CHOL 109 02/16/2018   HDL 40 (L) 02/16/2018   LDLCALC 57 02/16/2018   TRIG 42 02/16/2018   CHOLHDL 2.7 02/16/2018   HIV 1 RNA Quant (copies/mL)  Date Value  06/11/2018 <20 NOT DETECTED  02/16/2018 <20 NOT DETECTED  11/20/2017 <20 NOT DETECTED   CD4 T Cell Abs (/uL)  Date Value  06/11/2018 1,250  02/16/2018 1,260  11/20/2017 1,450   Lab Results  Component Value Date   HAV REACTIVE (A) 05/22/2017   Lab Results  Component Value Date   HEPBSAG NON-REACTIVE 05/22/2017   HEPBSAB NON-REACTIVE 05/22/2017    Problem List Items Addressed This Visit      Unprioritized   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       Relevant Orders   Hepatitis B surface antibody,qualitative   HIV (human immunodeficiency virus infection) (HCC) - Primary (Chronic)    He is doing very well on his Biktarvy and has a lot of personal ownership around taking his medications regularly. We supplied him with 81m supply of Dovato to bridge him until he can fill his Biktarvy again. Will check VL today knowing that if he is low level viremic we have an explanation why.   Provided with [facility] dental contact today to reschedule appointment.  He can return in 4 months with labs prior to if he can. Will set him up on MyChart.        Relevant Orders   RPR   HIV-1 RNA quant-no reflex-bld   HIV-1 RNA quant-no reflex-bld   T-helper cell (CD4)- ([facility] clinic only)   CBC with Differential/Platelet   COMPLETE METABOLIC PANEL  WITH GFR   RPR   Lipid panel   Urine cytology ancillary only   Urinalysis   Pneumococcal conjugate vaccine 13-valent IM (Completed)   Moderate episode of recurrent major depressive disorder (HCC)    Well maintained on current medications. Managed by mental health provider.       Need for prophylactic vaccination against Streptococcus pneumoniae (pneumococcus)    Prevnar vaccine today.       Relevant Orders   Pneumococcal conjugate vaccine 13-valent IM (Completed)   Screening for STDs (sexually transmitted diseases)    Reviewed + gonorrhea labs from February. His symptoms have resolved following treatment and has had no new partners/encounters since. Will check RPR today with high risk sexual behavior.         Rexene Alberts, MSN, NP-C [facility] Infectious [facility] Pager: [phone]  10/29/18

## 2018-10-29 NOTE — Assessment & Plan Note (Signed)
Will repeat hep b surface antibody to capture immunity s/p immunizations.

## 2018-10-29 NOTE — Assessment & Plan Note (Addendum)
He is doing very well on his Susanne Borders and has a lot of personal ownership around taking his medications regularly. We supplied him with 19m supply of Dovato to bridge him until he can fill his Biktarvy again. Will check VL today knowing that if he is low level viremic we have an explanation why.   Provided with [facility] dental contact today to reschedule appointment.  He can return in 4 months with labs prior to if he can. Will set him up on MyChart.

## 2018-10-31 LAB — HIV-1 RNA QUANT-NO REFLEX-BLD
HIV 1 RNA QUANT: DETECTED {copies}/mL — AB
HIV-1 RNA Quant, Log: <1.3 Log copies/mL — AB

## 2018-10-31 LAB — RPR: RPR Ser Ql: NONREACTIVE

## 2019-01-06 ENCOUNTER — Encounter (HOSPITAL_COMMUNITY): Payer: Self-pay | Admitting: Emergency Medicine

## 2019-01-06 ENCOUNTER — Other Ambulatory Visit: Payer: Self-pay

## 2019-01-06 ENCOUNTER — Emergency Department (HOSPITAL_COMMUNITY)
Admission: EM | Admit: 2019-01-06 | Discharge: 2019-01-07 | Disposition: A | Discharge status: Home / Self Care | Payer: Medicaid Other | Attending: Emergency Medicine | Admitting: Emergency Medicine

## 2019-01-06 DIAGNOSIS — Z720 Tobacco use: Secondary | ICD-10-CM | POA: Diagnosis not present

## 2019-01-06 DIAGNOSIS — J45909 Unspecified asthma, uncomplicated: Secondary | ICD-10-CM | POA: Insufficient documentation

## 2019-01-06 DIAGNOSIS — A539 Syphilis, unspecified: Secondary | ICD-10-CM | POA: Diagnosis not present

## 2019-01-06 DIAGNOSIS — Z79899 Other long term (current) drug therapy: Secondary | ICD-10-CM | POA: Diagnosis not present

## 2019-01-06 DIAGNOSIS — R21 Rash and other nonspecific skin eruption: Secondary | ICD-10-CM | POA: Diagnosis present

## 2019-01-06 LAB — CBC WITH DIFFERENTIAL/PLATELET
Abs Immature Granulocytes: 0.02 10*3/uL (ref 0.00–0.07)
Basophils Absolute: 0 10*3/uL (ref 0.0–0.1)
Basophils Relative: 0 %
Eosinophils Absolute: 0 10*3/uL (ref 0.0–0.5)
Eosinophils Relative: 1 %
HCT: 47.3 % (ref 39.0–52.0)
Hemoglobin: 15 g/dL (ref 13.0–17.0)
Immature Granulocytes: 0 %
Lymphocytes Relative: 22 %
Lymphs Abs: 2 10*3/uL (ref 0.7–4.0)
MCH: 28.2 pg (ref 26.0–34.0)
MCHC: 31.7 g/dL (ref 30.0–36.0)
MCV: 89.1 fL (ref 80.0–100.0)
Monocytes Absolute: 0.6 10*3/uL (ref 0.1–1.0)
Monocytes Relative: 7 %
Neutro Abs: 6.2 10*3/uL (ref 1.7–7.7)
Neutrophils Relative %: 70 %
Platelets: 245 10*3/uL (ref 150–400)
RBC: 5.31 MIL/uL (ref 4.22–5.81)
RDW: 13.5 % (ref 11.5–15.5)
WBC: 8.9 10*3/uL (ref 4.0–10.5)
nRBC: 0 % (ref 0.0–0.2)

## 2019-01-06 LAB — COMPREHENSIVE METABOLIC PANEL
ALT: 16 U/L (ref 0–44)
AST: 18 U/L (ref 15–41)
Albumin: 4 g/dL (ref 3.5–5.0)
Alkaline Phosphatase: 113 U/L (ref 38–126)
Anion gap: 10 (ref 5–15)
BUN: 11 mg/dL (ref 6–20)
CO2: 23 mmol/L (ref 22–32)
Calcium: 9.1 mg/dL (ref 8.9–10.3)
Chloride: 101 mmol/L (ref 98–111)
Creatinine, Ser: 1.13 mg/dL (ref 0.61–1.24)
GFR calc Af Amer: >60 mL/min (ref >60)
GFR calc non Af Amer: >60 mL/min (ref >60)
Glucose, Bld: 100 mg/dL — ABNORMAL HIGH (ref 70–99)
Potassium: 3.9 mmol/L (ref 3.5–5.1)
Sodium: 134 mmol/L — ABNORMAL LOW (ref 135–145)
Total Bilirubin: 0.7 mg/dL (ref 0.3–1.2)
Total Protein: 7.1 g/dL (ref 6.5–8.1)

## 2019-01-06 MED ORDER — LIDOCAINE HCL (PF) 1 % IJ SOLN
INTRAMUSCULAR | Status: AC
  Administered 2019-01-06: 22:00:00 5 mL
  Filled 2019-01-06: qty 5

## 2019-01-06 MED ORDER — AZITHROMYCIN 250 MG PO TABS
1000.0000 mg | ORAL_TABLET | Freq: Once | ORAL | Status: AC
  Administered 2019-01-06: 1000 mg via ORAL
  Filled 2019-01-06: qty 4

## 2019-01-06 MED ORDER — PENICILLIN G BENZATHINE 1200000 UNIT/2ML IM SUSP
2.4000 10*6.[IU] | Freq: Once | INTRAMUSCULAR | Status: AC
  Administered 2019-01-06: 2.4 10*6.[IU] via INTRAMUSCULAR
  Filled 2019-01-06: qty 4

## 2019-01-06 MED ORDER — CEFTRIAXONE SODIUM 250 MG IJ SOLR
250.0000 mg | Freq: Once | INTRAMUSCULAR | Status: AC
  Administered 2019-01-06: 22:00:00 250 mg via INTRAMUSCULAR
  Filled 2019-01-06: qty 250

## 2019-01-06 MED ORDER — LACTATED RINGERS IV BOLUS
1000.0000 mL | Freq: Once | INTRAVENOUS | Status: AC
  Administered 2019-01-06: 23:00:00 1000 mL via INTRAVENOUS

## 2019-01-06 NOTE — ED Triage Notes (Addendum)
Patient reports skin rashes at abdomen with fatigue and mild lightheadedness onset 4 days ago . Denies fever or chills . History of HIV , requesting STD screening .

## 2019-01-06 NOTE — ED Notes (Signed)
Unable to start IV access several times , will consult IV team .

## 2019-01-06 NOTE — ED Provider Notes (Signed)
[facility] EMERGENCY DEPARTMENT Provider Note   CSN: [id number] Arrival date & time: 01/06/19  2135   History   [facility] Chief Complaint  Patient presents with  . Rash    HPI Gerald Lee is a 23 y.o. male.   HPI 23 year old male with history of HIV presents with lightheadedness and rash.  Patient states that he smoked marijuana last night and slept all day today.  He woke up this afternoon around 4 and felt lightheaded.  He had not anything to eat or drink.  Denies nausea, vomiting, fever, headache, vision changes, urinary symptoms, or abdominal pain.  Patient states that he has a history of low blood pressure and was concerned so come to the ED for evaluation.  No recent illnesses.  No sick contacts.  Denies chest pain or shortness of breath.  No cough.  Patient does state that he has had a rash on his abdomen for the past 3 to 4 days.  He also endorses a painless lesion on his testicles.  No mucous membrane involvement.  No rash of the palms or soles.  He is sexually active with men only.  He does have a history of syphilis.  Past Medical History:  Diagnosis Date  . Asthma   . Headache(784.0)   . HIV infection [facility](HCC)     Patient Active Problem List   Diagnosis Date Noted  . 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 Need for prophylactic vaccination against Streptococcus pneumoniae (pneumococcus) 10/29/2018  . Screening for STDs (sexually transmitted diseases) 06/11/2018  . History of syphilis 07/11/2017  . 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  . HIV (human immunodeficiency virus infection) (HCC) 05/22/2017  . Healthcare maintenance 05/22/2017  . Migraine without aura 03/14/2014  . Obesity, unspecified 03/14/2014  . Insomnia, unspecified 03/14/2014    Past Surgical History:  Procedure Laterality Date  . CIRCUMCISION  1998  . HERNIA REPAIR     Done between the ages of 89 or 8 years   . NO  PAST SURGERIES          Home Medications    Prior to Admission medications   Medication Sig Start Date End Date Taking? Authorizing Provider  BIKTARVY 50-200-25 MG TABS tablet TAKE 1 TABLET BY MOUTH DAILY 05/15/18   Blanchard Kelchixon, Stephanie N, NP  hydrOXYzine (ATARAX/VISTARIL) 25 MG tablet Take 1 tablet (25 mg total) by mouth 3 (three) times daily as needed for anxiety. Patient not taking: Reported on 06/11/2018 06/27/17   Money, Gerlene Burdockravis B, FNP  ondansetron (ZOFRAN) 4 MG tablet Take 1 tablet (4 mg total) by mouth every 12 (twelve) hours as needed (30 minutes before antibiotic if needed to prevent nausea). 08/25/17   Blanchard Kelchixon, Stephanie N, NP  oseltamivir (TAMIFLU) 75 MG capsule Take 1 capsule (75 mg total) by mouth every 12 (twelve) hours. Patient not taking: Reported on 10/29/2018 08/26/18   Fayrene Helperran, Bowie, PA-C  SUMAtriptan (IMITREX) 50 MG tablet Take 1 tablet (50 mg total) by mouth once for 1 dose. May repeat in 2 hours if headache persists or recurs. No more than 4 tablets in 24 hour period. 03/02/18 03/02/18  Blanchard Kelchixon, Stephanie N, NP  traZODone (DESYREL) 50 MG tablet Take 1-2 tablets (50-100 mg total) by mouth at bedtime as needed for sleep. 02/27/18   Blanchard Kelchixon, Stephanie N, NP  venlafaxine XR (EFFEXOR-XR) 75 MG 24 hr capsule Take 1 capsule (75 mg total) by mouth daily with breakfast. For mood control 02/27/18  Blanchard Kelch, NP    Family History Family History  Problem Relation Age of Onset  . Stroke Mother   . Hypertension Mother   . Diabetes Father     Social History Social History   Tobacco Use  . Smoking status: Light Tobacco Smoker    Packs/day: 0.10    Types: Cigarettes, Cigars  . Smokeless tobacco: Never Used  . Tobacco comment: "only when super stressed"  Substance Use Topics  . Alcohol use: Yes    Comment: occ  . Drug use: Yes    Frequency: 7.0 times per week    Types: Marijuana     Allergies   Ibuprofen   Review of Systems Review of Systems  Constitutional: Negative for  chills and fever.  HENT: Negative for ear pain and sore throat.   Eyes: Negative for pain and visual disturbance.  Respiratory: Negative for cough and shortness of breath.   Cardiovascular: Negative for chest pain and palpitations.  Gastrointestinal: Negative for abdominal pain and vomiting.  Genitourinary: Negative for dysuria and hematuria.  Musculoskeletal: Negative for arthralgias and back pain.  Skin: Positive for rash. Negative for color change.  Neurological: Positive for light-headedness. Negative for seizures and syncope.  All other systems reviewed and are negative.    Physical Exam Updated Vital Signs BP 127/73 (BP Location: Right Arm)   Pulse 85   Temp 98.3 F (36.8 C) (Oral)   SpO2 90%   Physical Exam Vitals signs and nursing note reviewed.  Constitutional:      Appearance: He is well-developed.  HENT:     Head: Normocephalic and atraumatic.  Eyes:     Conjunctiva/sclera: Conjunctivae normal.  Neck:     Musculoskeletal: Neck supple.  Cardiovascular:     Rate and Rhythm: Normal rate and regular rhythm.     Heart sounds: No murmur.  Pulmonary:     Effort: Pulmonary effort is normal. No respiratory distress.     Breath sounds: Normal breath sounds.  Abdominal:     Palpations: Abdomen is soft.     Tenderness: There is no abdominal tenderness.  Genitourinary:    Penis: Normal.      Comments: Painless ulcer on the testicles No penile discharge or erythema No inguinal lymphadenopathy Skin:    General: Skin is warm and dry.     Comments: Papular rash on the abdomen No mucous membrane involvement No rash on the palms or soles  Neurological:     Mental Status: He is alert.      ED Treatments / Results  Labs (all labs ordered are listed, but only abnormal results are displayed) Labs Reviewed  COMPREHENSIVE METABOLIC PANEL - Abnormal; Notable for the following components:      Result Value   Sodium 134 (*)    Glucose, Bld 100 (*)    All other  components within normal limits  CBC WITH DIFFERENTIAL/PLATELET  RPR  GC/CHLAMYDIA PROBE AMP ([place]) NOT AT [facility]    EKG EKG Interpretation  Date/Time:  Sunday Jan 06 2019 22:05:09 EDT Ventricular Rate:  88 PR Interval:    QRS Duration: 98 QT Interval:  340 QTC Calculation: 412 R Axis:   101 Text Interpretation:  Sinus rhythm Borderline right axis deviation ST elevation suggests acute pericarditis Confirmed by Kennis Carina [phone]) on 01/06/2019 10:13:55 PM   Radiology No results found.  Procedures Procedures (including critical care time)  Medications Ordered in ED Medications  lactated ringers bolus 1,000 mL (has no administration in time  range)  penicillin g benzathine (BICILLIN LA) 1200000 UNIT/2ML injection 2.4 Million Units (2.4 Million Units Intramuscular Given 01/06/19 2204)  cefTRIAXone (ROCEPHIN) injection 250 mg (250 mg Intramuscular Given 01/06/19 2228)  azithromycin (ZITHROMAX) tablet 1,000 mg (1,000 mg Oral Given 01/06/19 2228)  lidocaine (PF) (XYLOCAINE) 1 % injection (5 mLs  Given 01/06/19 2228)     Initial Impression / Assessment and Plan / ED Course  I have reviewed the triage vital signs and the nursing notes.  Pertinent labs & imaging results that were available during my care of the patient were reviewed by me and considered in my medical decision making (see chart for details).  23 year old male with history of HIV presents with lightheadedness and rash.  Hemodynamically stable.  Afebrile.  Patient is well-appearing.  Benign abdominal exam.   I do not suspect PCP, TB, CNS abnormalities.  No chest pain or shortness of breath.  Doubt PE or ACS.  EKG sinus rhythm with right axis deviation.  No ischemic changes.  Rate 88.  On exam, he has a maculopapular rash on his abdomen as well as a painless lesion on his testicles.  No lymphadenopathy.  No penile lesions.  No penile discharge.   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  Patient will need repeat dose in 7 days as he is HIV positive.  Bolus of fluids given.    Labs unremarkable.  RPR, GC/committee is sent.  Provided instructions the patient needs to follow-up with his PCP in 7 days for repeat penicillin shot.  Final Clinical Impressions(s) / ED Diagnoses   Final diagnoses:  Syphilis    ED Discharge Orders    None       Vallery Ridge, MD 01/06/19 2249    Sabas Sous, MD 01/06/19 [phone]

## 2019-01-07 LAB — GC/CHLAMYDIA PROBE AMP (~~LOC~~) NOT AT ARMC
Chlamydia: NEGATIVE
Neisseria Gonorrhea: NEGATIVE

## 2019-01-08 LAB — RPR: RPR Ser Ql: REACTIVE — AB

## 2019-01-08 LAB — RPR, QUANT+TP ABS (REFLEX)
Rapid Plasma Reagin, Quant: 1:32 {titer} — ABNORMAL HIGH
T Pallidum Abs: REACTIVE — AB

## 2019-02-22 ENCOUNTER — Other Ambulatory Visit: Payer: Self-pay | Admitting: Infectious Diseases

## 2019-03-11 ENCOUNTER — Other Ambulatory Visit (HOSPITAL_COMMUNITY)
Admission: RE | Admit: 2019-03-11 | Discharge: 2019-03-11 | Disposition: A | Discharge status: Home / Self Care | Payer: Medicaid Other | Source: Ambulatory Visit | Attending: Infectious Diseases | Admitting: Infectious Diseases

## 2019-03-11 ENCOUNTER — Other Ambulatory Visit: Payer: Medicaid Other

## 2019-03-11 ENCOUNTER — Other Ambulatory Visit: Payer: Self-pay

## 2019-03-11 DIAGNOSIS — Z21 Asymptomatic human immunodeficiency virus [HIV] infection status: Secondary | ICD-10-CM | POA: Insufficient documentation

## 2019-03-11 DIAGNOSIS — Z9229 Personal history of other drug therapy: Secondary | ICD-10-CM

## 2019-03-12 LAB — URINALYSIS
Bilirubin Urine: NEGATIVE
Glucose, UA: NEGATIVE
Hgb urine dipstick: NEGATIVE
Ketones, ur: NEGATIVE
Leukocytes,Ua: NEGATIVE
Nitrite: NEGATIVE
Protein, ur: NEGATIVE
Specific Gravity, Urine: 1.021 (ref 1.001–1.03)
pH: 6 (ref 5.0–8.0)

## 2019-03-12 LAB — URINE CYTOLOGY ANCILLARY ONLY
Chlamydia: NEGATIVE
Neisseria Gonorrhea: NEGATIVE

## 2019-03-12 LAB — T-HELPER CELL (CD4) - (RCID CLINIC ONLY)
CD4 % Helper T Cell: 31 % — ABNORMAL LOW (ref 33–65)
CD4 T Cell Abs: 968 /uL (ref 400–1790)

## 2019-03-14 LAB — COMPLETE METABOLIC PANEL WITH GFR
AG Ratio: 1.8 (calc) (ref 1.0–2.5)
ALT: 13 U/L (ref 9–46)
AST: 14 U/L (ref 10–40)
Albumin: 4.6 g/dL (ref 3.6–5.1)
Alkaline phosphatase (APISO): 113 U/L (ref 36–130)
BUN: 12 mg/dL (ref 7–25)
CO2: 29 mmol/L (ref 20–32)
Calcium: 10.2 mg/dL (ref 8.6–10.3)
Chloride: 104 mmol/L (ref 98–110)
Creat: 1.17 mg/dL (ref 0.60–1.35)
GFR, Est African American: 102 mL/min/{1.73_m2} (ref >=60)
GFR, Est Non African American: 88 mL/min/{1.73_m2} (ref >=60)
Globulin: 2.5 g/dL (calc) (ref 1.9–3.7)
Glucose, Bld: 93 mg/dL (ref 65–99)
Potassium: 4.5 mmol/L (ref 3.5–5.3)
Sodium: 140 mmol/L (ref 135–146)
Total Bilirubin: 0.5 mg/dL (ref 0.2–1.2)
Total Protein: 7.1 g/dL (ref 6.1–8.1)

## 2019-03-14 LAB — HIV-1 RNA QUANT-NO REFLEX-BLD
HIV 1 RNA Quant: <20 copies/mL
HIV-1 RNA Quant, Log: <1.3 Log copies/mL

## 2019-03-14 LAB — RPR: RPR Ser Ql: REACTIVE — AB

## 2019-03-14 LAB — CBC WITH DIFFERENTIAL/PLATELET
Absolute Monocytes: 511 cells/uL (ref 200–950)
Basophils Absolute: 43 cells/uL (ref 0–200)
Basophils Relative: 0.6 %
Eosinophils Absolute: 101 cells/uL (ref 15–500)
Eosinophils Relative: 1.4 %
HCT: 48.5 % (ref 38.5–50.0)
Hemoglobin: 16.1 g/dL (ref 13.2–17.1)
Lymphs Abs: 3218 cells/uL (ref 850–3900)
MCH: 28.9 pg (ref 27.0–33.0)
MCHC: 33.2 g/dL (ref 32.0–36.0)
MCV: 87.1 fL (ref 80.0–100.0)
MPV: 10.8 fL (ref 7.5–12.5)
Monocytes Relative: 7.1 %
Neutro Abs: 3326 cells/uL (ref 1500–7800)
Neutrophils Relative %: 46.2 %
Platelets: 242 10*3/uL (ref 140–400)
RBC: 5.57 10*6/uL (ref 4.20–5.80)
RDW: 13.9 % (ref 11.0–15.0)
Total Lymphocyte: 44.7 %
WBC: 7.2 10*3/uL (ref 3.8–10.8)

## 2019-03-14 LAB — RPR TITER: RPR Titer: 1:8 {titer} — ABNORMAL HIGH

## 2019-03-14 LAB — LIPID PANEL
Cholesterol: 132 mg/dL (ref <200)
HDL: 39 mg/dL — ABNORMAL LOW (ref >=40)
LDL Cholesterol (Calc): 78 mg/dL (calc)
Non-HDL Cholesterol (Calc): 93 mg/dL (calc) (ref <130)
Total CHOL/HDL Ratio: 3.4 (calc) (ref <5.0)
Triglycerides: 66 mg/dL (ref <150)

## 2019-03-14 LAB — FLUORESCENT TREPONEMAL AB(FTA)-IGG-BLD: Fluorescent Treponemal ABS: REACTIVE — AB

## 2019-03-14 LAB — HEPATITIS B SURFACE ANTIBODY,QUALITATIVE: Hep B S Ab: REACTIVE — AB

## 2019-03-25 ENCOUNTER — Telehealth: Payer: Self-pay | Admitting: Pharmacy Technician

## 2019-03-25 ENCOUNTER — Encounter: Payer: Self-pay | Admitting: Infectious Diseases

## 2019-03-25 ENCOUNTER — Other Ambulatory Visit: Payer: Self-pay

## 2019-03-25 ENCOUNTER — Other Ambulatory Visit: Payer: Self-pay | Admitting: Infectious Diseases

## 2019-03-25 ENCOUNTER — Ambulatory Visit (INDEPENDENT_AMBULATORY_CARE_PROVIDER_SITE_OTHER): Payer: Medicaid Other | Admitting: Infectious Diseases

## 2019-03-25 DIAGNOSIS — Z8619 Personal history of other infectious and parasitic diseases: Secondary | ICD-10-CM

## 2019-03-25 DIAGNOSIS — R252 Cramp and spasm: Secondary | ICD-10-CM | POA: Diagnosis not present

## 2019-03-25 DIAGNOSIS — Z21 Asymptomatic human immunodeficiency virus [HIV] infection status: Secondary | ICD-10-CM

## 2019-03-25 DIAGNOSIS — M7582 Other shoulder lesions, left shoulder: Secondary | ICD-10-CM

## 2019-03-25 DIAGNOSIS — M778 Other enthesopathies, not elsewhere classified: Secondary | ICD-10-CM

## 2019-03-25 DIAGNOSIS — Z0000 Encounter for general adult medical examination without abnormal findings: Secondary | ICD-10-CM

## 2019-03-25 DIAGNOSIS — F331 Major depressive disorder, recurrent, moderate: Secondary | ICD-10-CM | POA: Diagnosis not present

## 2019-03-25 NOTE — Assessment & Plan Note (Signed)
Recent electrolyte panel reveals no abnormalities.  I suggested he stay well-hydrated throughout the day, stretch and consider starting magnesium 200 to 400 mg nightly to help.

## 2019-03-25 NOTE — Progress Notes (Signed)
Name: Gerald Lee  DOB: February 27, 1996  MRN: [id number] PCP: Nolene Ebbs, MD    Patient Active Problem List   Diagnosis Date Noted  . Tendinitis of left shoulder 03/25/2019  . Muscle cramps 03/25/2019  . 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 History of syphilis 07/11/2017  . Moderate episode of recurrent major depressive disorder ([place]) 06/21/2017  . HIV (human immunodeficiency virus infection) ([place]) 05/22/2017  . Healthcare maintenance 05/22/2017  . Migraine without aura 03/14/2014  . Obesity, unspecified 03/14/2014  . Insomnia, unspecified 03/14/2014    Brief Narrative:  Gerald Lee is a 23 y.o. AA male with HIV infection. Diagnosed September 2018 with CD4 nadir 780.  Hep B sAg (-) High Risk: MSM.  Previous OIs: none  Previous Regimens:   Biktarvy 2018 --> suppressed   Genotype:   02/2017 - no mutations   Subjective:  CC: HIV care and follow up. Interval history noted for +Syphilis rash in May 2020 (Tx RPR 1:32 --> 1:8 now). He has a lot of stress at home right now.    HPI:  Gerald Lee is doing well taking his Biktarvy once daily as instructed. He has remained undetectable since starting him on therapy at diagnosis and continues to tolerate this regimen well without concern for side effects. He does however have concern about accessing his medications. He says that his disability was "cut off" and he is uncertain if he has active Medicaid any longer. He has continued to pick up his medication without change to his copay.  He has had multiple sexual partners and was recently in the ER for treatment of syphilis and screened negative for other STIs based on his risk factors.   He tells me that he has had poor sleep lately d/t caring for his mother. She apparently falls at night and stays up to help her. He has trazodone but does not use it as he is fearful of being in a deep sleep. He is eating normally and has access to food for now but concerned over  financial and housing strain of multiple late bills including rent and utilities. He fears they will be facing homelessness soon.   Largely his physical concerns today include bilateral cramping leg/calf pain that sounds to be severe and awakens him at night. He has to aggressively massage these areas to help work the "muscle knots" out. He is not taking anything regularly to help with pain. He also describes a tightness in his left anterior shoulder. No injury known for any of his pain. None of the above limit his ADLs.    Review of Systems  Constitutional: Negative for chills and fever.  HENT: Negative for tinnitus.   Eyes: Negative for blurred vision and photophobia.  Respiratory: Negative for cough and sputum production.   Cardiovascular: Negative for chest pain.  Gastrointestinal: Negative for diarrhea, nausea and vomiting.  Genitourinary: Negative for dysuria.  Musculoskeletal: Positive for joint pain and myalgias.       As described above  Skin: Negative for rash.  Neurological: Negative for headaches.    Past Medical History:  Diagnosis Date  . Asthma   . Headache(784.0)   . HIV infection ([place])     Social History   Tobacco Use  . Smoking status: Light Tobacco Smoker    Packs/day: 0.10    Types: Cigarettes, Cigars  . Smokeless tobacco: Never Used  . Tobacco comment: "only when super stressed"  Substance Use Topics  . Alcohol use: Yes    Comment:  occ  . Drug use: Yes    Frequency: 7.0 times per week    Types: Marijuana    Allergies  Allergen Reactions  . Ibuprofen Other (See Comments)    Per patient "can not take because it will shut down my kidneys"    Objective:  There were no vitals filed for this visit. There is no height or weight on file to calculate BMI.  Physical Exam Vitals signs reviewed.  Constitutional:      Appearance: Normal appearance. He is obese. He is not ill-appearing.  Cardiovascular:     Rate and Rhythm: Normal rate and regular  rhythm.  Pulmonary:     Effort: Pulmonary effort is normal.  Abdominal:     Tenderness: There is no abdominal tenderness.  Musculoskeletal:        General: No swelling.     Left shoulder: He exhibits tenderness (anterior shoulder as pictured ) and effusion. He exhibits normal range of motion, no swelling, no pain and normal strength.  Neurological:     Mental Status: He is alert.      Lab Results Lab Results  Component Value Date   WBC 7.2 03/11/2019   HGB 16.1 03/11/2019   HCT 48.5 03/11/2019   MCV 87.1 03/11/2019   PLT 242 03/11/2019    Lab Results  Component Value Date   CREATININE 1.17 03/11/2019   BUN 12 03/11/2019   NA 140 03/11/2019   K 4.5 03/11/2019   CL 104 03/11/2019   CO2 29 03/11/2019    Lab Results  Component Value Date   ALT 13 03/11/2019   AST 14 03/11/2019   ALKPHOS 113 01/06/2019   BILITOT 0.5 03/11/2019    Lab Results  Component Value Date   CHOL 132 03/11/2019   HDL 39 (L) 03/11/2019   LDLCALC 78 03/11/2019   TRIG 66 03/11/2019   CHOLHDL 3.4 03/11/2019   HIV 1 RNA Quant (copies/mL)  Date Value  03/11/2019 <20 NOT DETECTED  10/29/2018 <20 DETECTED (A)  06/11/2018 <20 NOT DETECTED   CD4 T Cell Abs (/uL)  Date Value  03/11/2019 968  06/11/2018 1,250  02/16/2018 1,260   Lab Results  Component Value Date   HAV REACTIVE (A) 05/22/2017   Lab Results  Component Value Date   HEPBSAG NON-REACTIVE 05/22/2017   HEPBSAB REACTIVE (A) 03/11/2019    Problem List Items Addressed This Visit      Unprioritized   HIV (human immunodeficiency virus infection) (HCC) - Primary (Chronic)    Gerald Lee continues to do well on his [place].  I will have him meet with Gerald MessierKathy today to ensure his Medicaid is still active and if not begin Gerald Companyyan Lee and HMAP. Refills provided today. He will return in 4 months to check in again on other problems discussed today.  STI testing recently.  Declined condoms.  Safe sex discussions ongoing with his frequent  diagnosis of STIs.       Healthcare maintenance   Moderate episode of recurrent major depressive disorder (HCC)    Seems that he has some situational stressors that may be contributing. He is off daily medication and sites that he is not where he was in the past to need another pill daily. He has a good support system at home. I reminded him of our counseling services should he decide he needs additional support. He will monitor his mood for now. 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 Has had a resolution of rash with appropriate decrease in titer. Will continue to follow for him.       Tendinitis of left shoulder    Shoulder ROM and exam overall benign with exception for what seems to be some "pulling/tightness" over the anterior deltoid. Suspect tendinitis and suggest conservative treatment with rest, ice, anti-inflammatories.        Muscle cramps    Recent electrolyte panel reveals no abnormalities.  I suggested he stay well-hydrated throughout the day, stretch and consider starting magnesium 200 to 400 mg nightly to help.         Rexene AlbertsStephanie Dixon, MSN, NP-C [facility] Pager: [phone]  03/25/19

## 2019-03-25 NOTE — Assessment & Plan Note (Signed)
Shoulder ROM and exam overall benign with exception for what seems to be some "pulling/tightness" over the anterior deltoid. Suspect tendinitis and suggest conservative treatment with rest, ice, anti-inflammatories.

## 2019-03-25 NOTE — Assessment & Plan Note (Signed)
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 Has had a resolution of rash with appropriate decrease in titer. Will continue to follow for him.

## 2019-03-25 NOTE — Assessment & Plan Note (Signed)
Gerald Lee continues to do well on his [place].  I will have him meet with Juliann Pulse today to ensure his Medicaid is still active and if not begin [employer] and HMAP. Refills provided today. He will return in 4 months to check in again on other problems discussed today.  STI testing recently.  Declined condoms.  Safe sex discussions ongoing with his frequent diagnosis of STIs.

## 2019-03-25 NOTE — Assessment & Plan Note (Signed)
Flu shot in the fall

## 2019-03-25 NOTE — Patient Instructions (Signed)
  Please continue taking your Biktarvy every day as you are.  Your labs look perfect and I could not ask for any better results for you.  We will have you meet with our financial team to see what we need to do to keep you on your medications.  Lovena Le from [facility] [employer] will be reaching out to you to assist with some case management services during this rough period for you and your mom.  For your aches and pains I would like for you to start taking some ibuprofen over-the-counter 3 to 4 tablets 3 times a day (every 8 hours) with food and apply ice to your shoulder 15 minutes 1-3 times a day.  Your muscle cramping in your legs I believe are due to at least in part dehydration.  Please drink lots of water throughout the day.   I want your urine to be either clear or very pale yellow color. It may be helpful for you to start taking magnesium citrate supplements 200 to 400 mg at night.  This will help with smooth muscle relaxation and reduce the irritability you might be feeling.  I would start with 200 once a day and then add on top considering some side effects could be loose bowel movements.  Please come back to see Colletta Maryland in 4 months.  We can do your blood work at the visit since you use your MyChart regularly.

## 2019-03-25 NOTE — Telephone Encounter (Addendum)
[facility] Patient Advocate Encounter  Completed and sent [employer] Advancing Access application for Gerald Lee for this patient who is uninsured.  He has applied for HMAP and needs medication to bridge him.  He was denied immedate access because he has used it before.    They have since approved him for assistance for 1 year.  He should only use assistance until he is HMAP approved so that he has a back up in the future.  Patient assistance phone number for follow up is [phone].   ID [id number] BIN [id number] PCN [id number] GRP [id number]  Inez Catalina E. Nadara Mustard [place] [facility] Phone: [phone] Fax:  [phone]

## 2019-03-25 NOTE — Assessment & Plan Note (Signed)
Seems that he has some situational stressors that may be contributing. He is off daily medication and sites that he is not where he was in the past to need another pill daily. He has a good support system at home. I reminded him of our counseling services should he decide he needs additional support. He will monitor his mood for now. ?if this is contributing to some of his m

## 2019-04-05 ENCOUNTER — Encounter (HOSPITAL_COMMUNITY): Payer: Self-pay | Admitting: *Deleted

## 2019-04-05 ENCOUNTER — Other Ambulatory Visit: Payer: Self-pay

## 2019-04-05 ENCOUNTER — Emergency Department (HOSPITAL_COMMUNITY)
Admission: EM | Admit: 2019-04-05 | Discharge: 2019-04-05 | Disposition: A | Discharge status: Home / Self Care | Payer: Medicaid Other | Attending: Emergency Medicine | Admitting: Emergency Medicine

## 2019-04-05 DIAGNOSIS — Z79899 Other long term (current) drug therapy: Secondary | ICD-10-CM | POA: Insufficient documentation

## 2019-04-05 DIAGNOSIS — Z720 Tobacco use: Secondary | ICD-10-CM | POA: Insufficient documentation

## 2019-04-05 DIAGNOSIS — R238 Other skin changes: Secondary | ICD-10-CM

## 2019-04-05 DIAGNOSIS — L539 Erythematous condition, unspecified: Secondary | ICD-10-CM | POA: Diagnosis present

## 2019-04-05 DIAGNOSIS — B20 Human immunodeficiency virus [HIV] disease: Secondary | ICD-10-CM | POA: Diagnosis not present

## 2019-04-05 DIAGNOSIS — L03115 Cellulitis of right lower limb: Secondary | ICD-10-CM | POA: Diagnosis not present

## 2019-04-05 HISTORY — DX: Orthostatic hypotension: I95.1

## 2019-04-05 MED ORDER — CEPHALEXIN 500 MG PO CAPS: 500.0000 mg | ORAL_CAPSULE | Freq: Four times a day (QID) | ORAL | 0 refills | Status: DC

## 2019-04-05 MED ORDER — BACITRACIN ZINC 500 UNIT/GM EX OINT: 1.0000 "application " | TOPICAL_OINTMENT | Freq: Two times a day (BID) | CUTANEOUS | 0 refills | Status: DC

## 2019-04-05 NOTE — ED Provider Notes (Signed)
[facility] EMERGENCY DEPARTMENT Provider Note   CSN: [id number] Arrival date & time: 04/05/19  0710     History   Chief Complaint Chief Complaint  Patient presents with  . Insect Bite    HPI Gerald Lee is a 23 y.o. male with history of HIV, asthma who presents with irritation to his right ankle.  He reports that he began yesterday. 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 He also wears high boots that rubbed in that area.  He also reports he may have been bit by something.  He describes the area is painful and burning.  He denies any other associated symptoms.  No interventions taken prior to arrival.  Most recent CD4 counts 368.     HPI  Past Medical History:  Diagnosis Date  . Asthma   . Headache(784.0)   . HIV infection ([place])   . Orthostatic hypotension     Patient Active Problem List   Diagnosis Date Noted  . Tendinitis of left shoulder 03/25/2019  . Muscle cramps 03/25/2019  . 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 History of syphilis 07/11/2017  . Moderate episode of recurrent major depressive disorder ([place]) 06/21/2017  . HIV (human immunodeficiency virus infection) ([place]) 05/22/2017  . Healthcare maintenance 05/22/2017  . Migraine without aura 03/14/2014  . Obesity, unspecified 03/14/2014  . Insomnia, unspecified 03/14/2014    Past Surgical History:  Procedure Laterality Date  . CIRCUMCISION  1998  . HERNIA REPAIR     Done between the ages of 29 or 5 years   . NO PAST SURGERIES          Home Medications    Prior to Admission medications   Medication Sig Start Date End Date Taking? Authorizing Provider  bacitracin ointment Apply 1 application topically 2 (two) times daily. 04/05/19   [place] Takacs M, PA-C  BIKTARVY 50-200-25 MG TABS tablet TAKE 1 TABLET BY MOUTH DAILY 03/25/19   [place] Callas, NP  cephALEXin (KEFLEX) 500 MG capsule Take 1 capsule (500 mg total) by mouth 4 (four) times daily.  04/05/19   [place] Burgin, Bea Graff, PA-C  hydrOXYzine (ATARAX/VISTARIL) 25 MG tablet Take 1 tablet (25 mg total) by mouth 3 (three) times daily as needed for anxiety. 06/27/17   Money, Lowry Ram, FNP  ondansetron (ZOFRAN) 4 MG tablet Take 1 tablet (4 mg total) by mouth every 12 (twelve) hours as needed (30 minutes before antibiotic if needed to prevent nausea). 08/25/17   [place] Callas, NP  traZODone (DESYREL) 50 MG tablet Take 1-2 tablets (50-100 mg total) by mouth at bedtime as needed for sleep. Patient not taking: Reported on 03/25/2019 02/27/18   [place] Callas, NP  venlafaxine XR (EFFEXOR-XR) 75 MG 24 hr capsule TAKE ONE CAPSULE BY MOUTH DAILY WITH BREAKFAST FOR MOOD CONTROL Patient not taking: Reported on 03/25/2019 02/22/19   [place] Callas, NP    Family History Family History  Problem Relation Age of Onset  . Stroke Mother   . Hypertension Mother   . Diabetes Father     Social History Social History   Tobacco Use  . Smoking status: Light Tobacco Smoker    Packs/day: 0.10    Types: Cigarettes, Cigars  . Smokeless tobacco: Never Used  . Tobacco comment: "only when super stressed"  Substance Use Topics  . Alcohol use: Yes    Comment: occ  . Drug use: Yes    Frequency: 7.0 times per week  Types: Marijuana     Allergies   Ibuprofen   Review of Systems Review of Systems  Constitutional: Negative for fever.  Skin: Positive for rash and wound.     Physical Exam Updated Vital Signs BP 125/80 (BP Location: Right Arm)   Pulse 83   Temp 98.1 F (36.7 C) (Oral)   Resp 19   Ht 6\' 4"  (1.93 m)   Wt 106.6 kg   SpO2 99%   BMI 28.61 kg/m   Physical Exam Vitals signs and nursing note reviewed.  Constitutional:      General: He is not in acute distress.    Appearance: He is well-developed. He is not diaphoretic.  HENT:     Head: Normocephalic and atraumatic.     Mouth/Throat:     Pharynx: No oropharyngeal exudate.  Eyes:     General: No scleral icterus.        Right eye: No discharge.        Left eye: No discharge.     Conjunctiva/sclera: Conjunctivae normal.     Pupils: Pupils are equal, round, and reactive to light.  Neck:     Musculoskeletal: Normal range of motion and neck supple.     Thyroid: No thyromegaly.  Cardiovascular:     Rate and Rhythm: Normal rate and regular rhythm.     Heart sounds: Normal heart sounds. No murmur. No friction rub. No gallop.   Pulmonary:     Effort: Pulmonary effort is normal. No respiratory distress.     Breath sounds: Normal breath sounds. No stridor. No wheezing or rales.  Abdominal:     General: Bowel sounds are normal. There is no distension.     Palpations: Abdomen is soft.     Tenderness: There is no abdominal tenderness. There is no guarding or rebound.  Musculoskeletal:       Feet:  Lymphadenopathy:     Cervical: No cervical adenopathy.  Skin:    General: Skin is warm and dry.     Coloration: Skin is not pale.     Findings: No rash.  Neurological:     Mental Status: He is alert.     Coordination: Coordination normal.      ED Treatments / Results  Labs (all labs ordered are listed, but only abnormal results are displayed) Labs Reviewed - No data to display  EKG None  Radiology No results found.  Procedures Procedures (including critical care time)  Medications Ordered in ED Medications - No data to display   Initial Impression / Assessment and Plan / ED Course  I have reviewed the triage vital signs and the nursing notes.  Pertinent labs & imaging results that were available during my care of the patient were reviewed by me and considered in my medical decision making (see chart for details).        Patient presenting with abrasion/irritation to right ankle.  There does seem to be some early surrounding infection and will cover with Keflex, especially considering patient's HIV status.  Advised bacitracin.  Wound care discussed.  Return precautions discussed.  Patient  understands and agrees with plan.  Patient vital stable her ED course and discharged in satisfactory condition.  Final Clinical Impressions(s) / ED Diagnoses   Final diagnoses:  Skin irritation  Cellulitis of right lower extremity    ED Discharge Orders         Ordered    cephALEXin (KEFLEX) 500 MG capsule  4 times daily  04/05/19 0829    bacitracin ointment  2 times daily     04/05/19 0829           Emi HolesLaw, Yexalen Deike M, PA-C 04/05/19 [id number]    Loren RacerYelverton, David, MD 04/12/19 [phone]

## 2019-04-05 NOTE — ED Triage Notes (Signed)
PT states irritation to R lat ankle.  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

## 2019-04-05 NOTE — Discharge Instructions (Addendum)
Take Keflex until completed.  Apply bacitracin ointment twice daily.  Wash with warm soapy water daily and try to keep clean and dry.  Please return the emergency department if you develop any increasing redness, swelling, pain, drainage, or fever 100.4.

## 2019-04-08 ENCOUNTER — Emergency Department (HOSPITAL_COMMUNITY)
Admission: EM | Admit: 2019-04-08 | Discharge: 2019-04-08 | Disposition: A | Discharge status: Home / Self Care | Payer: Medicaid Other | Attending: Emergency Medicine | Admitting: Emergency Medicine

## 2019-04-08 ENCOUNTER — Other Ambulatory Visit: Payer: Self-pay

## 2019-04-08 ENCOUNTER — Encounter (HOSPITAL_COMMUNITY): Payer: Self-pay | Admitting: Emergency Medicine

## 2019-04-08 DIAGNOSIS — F17210 Nicotine dependence, cigarettes, uncomplicated: Secondary | ICD-10-CM | POA: Insufficient documentation

## 2019-04-08 DIAGNOSIS — K6289 Other specified diseases of anus and rectum: Secondary | ICD-10-CM | POA: Diagnosis present

## 2019-04-08 DIAGNOSIS — J45909 Unspecified asthma, uncomplicated: Secondary | ICD-10-CM | POA: Insufficient documentation

## 2019-04-08 NOTE — Discharge Instructions (Addendum)
Please follow-up immediately if he develop any new or worsening signs or symptoms.  Please follow-up with your primary care provider if symptoms continue to persist.

## 2019-04-08 NOTE — ED Provider Notes (Signed)
[facility] EMERGENCY DEPARTMENT Provider Note   CSN: [id number] Arrival date & time: 04/08/19  4034    History   Chief Complaint Chief Complaint  Patient presents with  . Rectal Pain    HPI Gerald Lee is a 23 y.o. male.     HPI    23 year old male presents today with complaints of rectal pain.  Patient notes he had rough intercourse 5 days ago.  He notes 3 days ago he developed soreness in his rectum described as tightness.  He denies any abdominal pain fever, penile or rectal discharge.  He notes normal bowel movements some discomfort with having a bowel movement.  He denies any blood in his stool.  He notes he has had infections previously but does not feel this is an infection.  He notes he is HIV positive but is undetectable and takes his medication daily.   Past Medical History:  Diagnosis Date  . Asthma   . Headache(784.0)   . HIV infection ([place])   . Orthostatic hypotension     Patient Active Problem List   Diagnosis Date Noted  . Tendinitis of left shoulder 03/25/2019  . Muscle cramps 03/25/2019  . 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 History of syphilis 07/11/2017  . Moderate episode of recurrent major depressive disorder ([place]) 06/21/2017  . HIV (human immunodeficiency virus infection) ([place]) 05/22/2017  . Healthcare maintenance 05/22/2017  . Migraine without aura 03/14/2014  . Obesity, unspecified 03/14/2014  . Insomnia, unspecified 03/14/2014    Past Surgical History:  Procedure Laterality Date  . CIRCUMCISION  1998  . HERNIA REPAIR     Done between the ages of 47 or 18 years   . NO PAST SURGERIES          Home Medications    Prior to Admission medications   Medication Sig Start Date End Date Taking? Authorizing Provider  bacitracin ointment Apply 1 application topically 2 (two) times daily. 04/05/19   Law, Alexandra M, PA-C  BIKTARVY 50-200-25 MG TABS tablet TAKE 1 TABLET BY MOUTH DAILY 03/25/19   [place] Callas, NP  cephALEXin (KEFLEX) 500 MG capsule Take 1 capsule (500 mg total) by mouth 4 (four) times daily. 04/05/19   Law, Bea Graff, PA-C  hydrOXYzine (ATARAX/VISTARIL) 25 MG tablet Take 1 tablet (25 mg total) by mouth 3 (three) times daily as needed for anxiety. 06/27/17   Money, Lowry Ram, FNP  ondansetron (ZOFRAN) 4 MG tablet Take 1 tablet (4 mg total) by mouth every 12 (twelve) hours as needed (30 minutes before antibiotic if needed to prevent nausea). 08/25/17   [place] Callas, NP  traZODone (DESYREL) 50 MG tablet Take 1-2 tablets (50-100 mg total) by mouth at bedtime as needed for sleep. Patient not taking: Reported on 03/25/2019 02/27/18   [place] Callas, NP  venlafaxine XR (EFFEXOR-XR) 75 MG 24 hr capsule TAKE ONE CAPSULE BY MOUTH DAILY WITH BREAKFAST FOR MOOD CONTROL Patient not taking: Reported on 03/25/2019 02/22/19   [place] Callas, NP    Family History Family History  Problem Relation Age of Onset  . Stroke Mother   . Hypertension Mother   . Diabetes Father     Social History Social History   Tobacco Use  . Smoking status: Light Tobacco Smoker    Packs/day: 0.10    Types: Cigarettes, Cigars  . Smokeless tobacco: Never Used  . Tobacco comment: "only when super stressed"  Substance Use Topics  . Alcohol use: Yes  Comment: occ  . Drug use: Yes    Frequency: 7.0 times per week    Types: Marijuana     Allergies   Ibuprofen   Review of Systems Review of Systems  All other systems reviewed and are negative.   Physical Exam Updated Vital Signs BP 119/81 (BP Location: Right Arm)   Pulse 78   Temp 98.5 F (36.9 C) (Oral)   Resp 20   SpO2 98%   Physical Exam Vitals signs and nursing note reviewed.  Constitutional:      Appearance: He is well-developed.  HENT:     Head: Normocephalic and atraumatic.  Eyes:     General: No scleral icterus.       Right eye: No discharge.        Left eye: No discharge.     Conjunctiva/sclera: Conjunctivae  normal.     Pupils: Pupils are equal, round, and reactive to light.  Neck:     Musculoskeletal: Normal range of motion.     Vascular: No JVD.     Trachea: No tracheal deviation.  Pulmonary:     Effort: Pulmonary effort is normal.     Breath sounds: No stridor.  Abdominal:     General: There is no distension.     Palpations: Abdomen is soft.     Tenderness: There is no abdominal tenderness.  Genitourinary:    Comments: Rectal exam shows no external hemorrhoids lesions swelling or redness, internal exam slightly uncomfortable with no masses, internal hemorrhoids, bleeding or discharge Neurological:     Mental Status: He is alert and oriented to person, place, and time.     Coordination: Coordination normal.  Psychiatric:        Behavior: Behavior normal.        Thought Content: Thought content normal.        Judgment: Judgment normal.      ED Treatments / Results  Labs (all labs ordered are listed, but only abnormal results are displayed) Labs Reviewed - No data to display  EKG None  Radiology No results found.  Procedures Procedures (including critical care time)  Medications Ordered in ED Medications - No data to display   Initial Impression / Assessment and Plan / ED Course  I have reviewed the triage vital signs and the nursing notes.  Pertinent labs & imaging results that were available during my care of the patient were reviewed by me and considered in my medical decision making (see chart for details).         Assessment/Plan: 23 year old male presents today with rectal pain.  Patient did have rough anal intercourse shortly prior to symptoms start.  He has had pain from sex similar to this previously.  He has no signs of infection on my exam.  I discussed imaging, testing, versus watch and wait approach.  Patient would like to watch and wait as symptoms have not worsened.  Patient will follow-up immediately if he develops any new or worsening signs or  symptoms.  Verbalized understanding and agreement to today's plan had no further questions or concerns at the time of discharge.   Final Clinical Impressions(s) / ED Diagnoses   Final diagnoses:  Rectal pain    ED Discharge Orders    None       Eyvonne MechanicHedges, Gary Gabrielsen, PA-C 04/08/19 [id number]    Pricilla LovelessGoldston, Scott, MD 04/08/19 1022

## 2019-04-08 NOTE — ED Triage Notes (Signed)
Rectal pain  x2 days has had inintercourse , did not hurt on friday

## 2019-04-18 ENCOUNTER — Emergency Department (HOSPITAL_COMMUNITY): Payer: Medicaid Other

## 2019-04-18 ENCOUNTER — Emergency Department (HOSPITAL_COMMUNITY)
Admission: EM | Admit: 2019-04-18 | Discharge: 2019-04-18 | Disposition: A | Discharge status: Home / Self Care | Payer: Medicaid Other | Attending: Emergency Medicine | Admitting: Emergency Medicine

## 2019-04-18 ENCOUNTER — Encounter (HOSPITAL_COMMUNITY): Payer: Self-pay

## 2019-04-18 ENCOUNTER — Other Ambulatory Visit: Payer: Self-pay

## 2019-04-18 DIAGNOSIS — R0981 Nasal congestion: Secondary | ICD-10-CM | POA: Insufficient documentation

## 2019-04-18 DIAGNOSIS — Z720 Tobacco use: Secondary | ICD-10-CM | POA: Insufficient documentation

## 2019-04-18 DIAGNOSIS — Z20828 Contact with and (suspected) exposure to other viral communicable diseases: Secondary | ICD-10-CM | POA: Diagnosis not present

## 2019-04-18 DIAGNOSIS — Z79899 Other long term (current) drug therapy: Secondary | ICD-10-CM | POA: Diagnosis not present

## 2019-04-18 DIAGNOSIS — Z8709 Personal history of other diseases of the respiratory system: Secondary | ICD-10-CM | POA: Diagnosis not present

## 2019-04-18 DIAGNOSIS — J069 Acute upper respiratory infection, unspecified: Secondary | ICD-10-CM | POA: Insufficient documentation

## 2019-04-18 DIAGNOSIS — B20 Human immunodeficiency virus [HIV] disease: Secondary | ICD-10-CM | POA: Diagnosis not present

## 2019-04-18 DIAGNOSIS — R05 Cough: Secondary | ICD-10-CM | POA: Diagnosis present

## 2019-04-18 MED ORDER — FLUTICASONE PROPIONATE 50 MCG/ACT NA SUSP: 1.0000 | Freq: Every day | NASAL | 2 refills | Status: DC

## 2019-04-18 NOTE — ED Triage Notes (Signed)
Patient complains of congestion and runny nose, cough, denies fever, starting two days ago. Took mucinex with some relief. Lives with mom, who started having similar symptoms.

## 2019-04-18 NOTE — Discharge Instructions (Signed)
Your chest x-ray today showed no signs of pneumonia.  We have tested you for COVID.  This test would not come back in about 24 to 48 hours.  If it is negative, you may return to work.  If it is positive, he should quarantine for 14 days.  Use Flonase as directed.  Return the emergency department for any fever, chest pain, difficulty breathing or worsening or concerning symptoms.     Person Under Monitoring Name: Gerald Lee  [facility] [place] [facility] [id number]   Infection Prevention Recommendations for Individuals Confirmed to have, or Being Evaluated for, 2019 Novel Coronavirus (COVID-19) Infection Who Receive Care at Home  Individuals who are confirmed to have, or are being evaluated for, COVID-19 should follow the prevention steps below until a healthcare provider or local or state health department says they can return to normal activities.  Stay home except to get medical care You should restrict activities outside your home, except for getting medical care. Do not go to work, school, or public areas, and do not use public transportation or taxis.  Call ahead before visiting your doctor Before your medical appointment, call the healthcare provider and tell them that you have, or are being evaluated for, COVID-19 infection. This will help the healthcare providers office take steps to keep other people from getting infected. Ask your healthcare provider to call the local or state health department.  Monitor your symptoms Seek prompt medical attention if your illness is worsening (e.g., difficulty breathing). Before going to your medical appointment, call the healthcare provider and tell them that you have, or are being evaluated for, COVID-19 infection. Ask your healthcare provider to call the local or state health department.  Wear a facemask You should wear a facemask that covers your nose and mouth when you are in the same room with other people and when you  visit a healthcare provider. People who live with or visit you should also wear a facemask while they are in the same room with you.  Separate yourself from other people in your home As much as possible, you should stay in a different room from other people in your home. Also, you should use a separate bathroom, if available.  Avoid sharing household items You should not share dishes, drinking glasses, cups, eating utensils, towels, bedding, or other items with other people in your home. After using these items, you should wash them thoroughly with soap and water.  Cover your coughs and sneezes Cover your mouth and nose with a tissue when you cough or sneeze, or you can cough or sneeze into your sleeve. Throw used tissues in a lined trash can, and immediately wash your hands with soap and water for at least 20 seconds or use an alcohol-based hand rub.  Wash your [employer] Corporationhands Wash your hands often and thoroughly with soap and water for at least 20 seconds. You can use an alcohol-based hand sanitizer if soap and water are not available and if your hands are not visibly dirty. Avoid touching your eyes, nose, and mouth with unwashed hands.   Prevention Steps for Caregivers and Household Members of Individuals Confirmed to have, or Being Evaluated for, COVID-19 Infection Being Cared for in the Home  If you live with, or provide care at home for, a person confirmed to have, or being evaluated for, COVID-19 infection please follow these guidelines to prevent infection:  Follow healthcare providers instructions Make sure that you understand and can help the patient  follow any healthcare provider instructions for all care.  Provide for the patients basic needs You should help the patient with basic needs in the home and provide support for getting groceries, prescriptions, and other personal needs.  Monitor the patients symptoms If they are getting sicker, call his or her medical provider and  tell them that the patient has, or is being evaluated for, COVID-19 infection. This will help the healthcare providers office take steps to keep other people from getting infected. Ask the healthcare provider to call the local or state health department.  Limit the number of people who have contact with the patient If possible, have only one caregiver for the patient. Other household members should stay in another home or place of residence. If this is not possible, they should stay in another room, or be separated from the patient as much as possible. Use a separate bathroom, if available. Restrict visitors who do not have an essential need to be in the home.  Keep older adults, very [place] children, and other sick people away from the patient Keep older adults, very [place] children, and those who have compromised immune systems or chronic health conditions away from the patient. This includes people with chronic heart, lung, or kidney conditions, diabetes, and cancer.  Ensure good ventilation Make sure that shared spaces in the home have good air flow, such as from an air conditioner or an opened window, weather permitting.  Wash your hands often Wash your hands often and thoroughly with soap and water for at least 20 seconds. You can use an alcohol based hand sanitizer if soap and water are not available and if your hands are not visibly dirty. Avoid touching your eyes, nose, and mouth with unwashed hands. Use disposable paper towels to dry your hands. If not available, use dedicated cloth towels and replace them when they become wet.  Wear a facemask and gloves Wear a disposable facemask at all times in the room and gloves when you touch or have contact with the patients blood, body fluids, and/or secretions or excretions, such as sweat, saliva, sputum, nasal mucus, vomit, urine, or feces.  Ensure the mask fits over your nose and mouth tightly, and do not touch it during use. Throw out  disposable facemasks and gloves after using them. Do not reuse. Wash your hands immediately after removing your facemask and gloves. If your personal clothing becomes contaminated, carefully remove clothing and launder. Wash your hands after handling contaminated clothing. Place all used disposable facemasks, gloves, and other waste in a lined container before disposing them with other household waste. Remove gloves and wash your hands immediately after handling these items.  Do not share dishes, glasses, or other household items with the patient Avoid sharing household items. You should not share dishes, drinking glasses, cups, eating utensils, towels, bedding, or other items with a patient who is confirmed to have, or being evaluated for, COVID-19 infection. After the person uses these items, you should wash them thoroughly with soap and water.  Wash laundry thoroughly Immediately remove and wash clothes or bedding that have blood, body fluids, and/or secretions or excretions, such as sweat, saliva, sputum, nasal mucus, vomit, urine, or feces, on them. Wear gloves when handling laundry from the patient. Read and follow directions on labels of laundry or clothing items and detergent. In general, wash and dry with the warmest temperatures recommended on the label.  Clean all areas the individual has used often Clean all touchable surfaces, such as  counters, tabletops, doorknobs, bathroom fixtures, toilets, phones, keyboards, tablets, and bedside tables, every day. Also, clean any surfaces that may have blood, body fluids, and/or secretions or excretions on them. Wear gloves when cleaning surfaces the patient has come in contact with. Use a diluted bleach solution (e.g., dilute bleach with 1 part bleach and 10 parts water) or a household disinfectant with a label that says EPA-registered for coronaviruses. To make a bleach solution at home, add 1 tablespoon of bleach to 1 quart (4 cups) of water.  For a larger supply, add  cup of bleach to 1 gallon (16 cups) of water. Read labels of cleaning products and follow recommendations provided on product labels. Labels contain instructions for safe and effective use of the cleaning product including precautions you should take when applying the product, such as wearing gloves or eye protection and making sure you have good ventilation during use of the product. Remove gloves and wash hands immediately after cleaning.  Monitor yourself for signs and symptoms of illness Caregivers and household members are considered close contacts, should monitor their health, and will be asked to limit movement outside of the home to the extent possible. Follow the monitoring steps for close contacts listed on the symptom monitoring form.   ? If you have additional questions, contact your local health department or call the epidemiologist on call at [phone] (available 24/7). ? This guidance is subject to change. For the most up-to-date guidance from [facility], please refer to their website: [email or web address]

## 2019-04-18 NOTE — ED Provider Notes (Signed)
[facility]   CSN: [id number] Arrival date & time: 04/18/19  1143     History   Chief Complaint Chief Complaint  Patient presents with   Cough    HPI Gerald Lee is a 23 y.o. male with PMH/o HIV (undetectable viral load) who presents for evaluation of 3 days of nasal congestion, sinus pressure and cough that began today.  Cough is productive of clear phlegm.  He has not noted any fevers, difficulty breathing.  He states that he has not had any recent travel and denies any known COVID-19 exposure.  He states he is compliant with his medications.  He denies any chest pain, difficulty breathing.     The history is provided by the patient.    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 Authorizing Provider  bacitracin ointment Apply 1 application topically 2 (two) times daily. 04/05/19   Law, Alexandra M, PA-C  BIKTARVY 50-200-25 MG TABS tablet TAKE 1 TABLET BY MOUTH DAILY 03/25/19   Blanchard Kelchixon, Stephanie N, NP  cephALEXin (KEFLEX) 500 MG capsule Take 1 capsule (500 mg total) by mouth 4  (four) times daily. 04/05/19   Law, Waylan BogaAlexandra M, PA-C  fluticasone (FLONASE) 50 MCG/ACT nasal spray Place 1 spray into both nostrils daily. 04/18/19   Maxwell CaulLayden, Rhylan Gross A, PA-C  hydrOXYzine (ATARAX/VISTARIL) 25 MG tablet Take 1 tablet (25 mg total) by mouth 3 (three) times daily as needed for anxiety. 06/27/17   Money, Gerlene Burdockravis B, FNP  ondansetron (ZOFRAN) 4 MG tablet Take 1 tablet (4 mg total) by mouth every 12 (twelve) hours as needed (30 minutes before antibiotic if needed to prevent nausea). 08/25/17   Blanchard Kelchixon, Stephanie N, NP  traZODone (DESYREL) 50 MG tablet Take 1-2 tablets (50-100 mg total) by mouth at bedtime as needed for sleep. Patient not taking: Reported on 03/25/2019 02/27/18   Blanchard Kelchixon, Stephanie N, NP  venlafaxine XR (EFFEXOR-XR) 75 MG 24 hr capsule TAKE ONE CAPSULE BY MOUTH DAILY WITH BREAKFAST FOR MOOD CONTROL Patient not taking: Reported on 03/25/2019 02/22/19   Blanchard Kelchixon, Stephanie N, NP    Family History Family History  Problem Relation Age of Onset   Stroke Mother    Hypertension Mother    Diabetes Father     Social History Social History   Tobacco Use   Smoking status: Light Tobacco Smoker    Packs/day: 0.10    Types: Cigarettes, Cigars   Smokeless tobacco: Never Used   Tobacco comment: "only when super stressed"  Substance Use Topics   Alcohol use:  Yes    Comment: occ   Drug use: Yes    Frequency: 7.0 times per week    Types: Marijuana     Allergies   Ibuprofen   Review of Systems Review of Systems  Constitutional: Negative for fever.  HENT: Positive for congestion and sinus pressure.   Respiratory: Positive for cough.   All other systems reviewed and are negative.    Physical Exam Updated Vital Signs BP 134/81 (BP Location: Right Arm)    Pulse 72    Temp 98.8 F (37.1 C) (Oral)    Resp 16    SpO2 100%   Physical Exam Vitals signs and nursing note reviewed.  Constitutional:      Appearance: He is well-developed.  HENT:     Head: Normocephalic and  atraumatic.  Eyes:     General: No scleral icterus.       Right eye: No discharge.        Left eye: No discharge.     Conjunctiva/sclera: Conjunctivae normal.  Pulmonary:     Effort: Pulmonary effort is normal.     Comments: Lungs clear to auscultation bilaterally.  Symmetric chest rise.  No wheezing, rales, rhonchi. Skin:    General: Skin is warm and dry.  Neurological:     Mental Status: He is alert.  Psychiatric:        Speech: Speech normal.        Behavior: Behavior normal.      ED Treatments / Results  Labs (all labs ordered are listed, but only abnormal results are displayed) Labs Reviewed  NOVEL CORONAVIRUS, NAA (HOSPITAL ORDER, SEND-OUT TO REF LAB)    EKG None  Radiology Dg Chest Portable 1 View  Result Date: 04/18/2019 CLINICAL DATA:  Cough. EXAM: PORTABLE CHEST 1 VIEW COMPARISON:  Radiographs of October 01, 2018. FINDINGS: The heart size and mediastinal contours are within normal limits. Both lungs are clear. The visualized skeletal structures are unremarkable. IMPRESSION: No active disease. Electronically Signed   By: Lupita RaiderJames  Green Jr M.D.   On: 04/18/2019 13:30    Procedures Procedures (including critical care time)  Medications Ordered in ED Medications - No data to display   Initial Impression / Assessment and Plan / ED Course  I have reviewed the triage vital signs and the nursing notes.  Pertinent labs & imaging results that were available during my care of the patient were reviewed by me and considered in my medical decision making (see chart for details).        23 year old male who presents for evaluation of 2 days of nasal congestion.  No fevers.  Reports started coughing today.  No known travel, no known COVID exposure.  Does have history of HIV.  He says his viral load is undetectable.  Has been compliant with his medications.  On initial ED arrival, he is afebrile, sitting comfortably on examination table.  Vital signs within normal limits.   On exam, lungs clear to auscultation bilaterally.  Suspect this may be viral URI with cough but given history of HIV we will plan for chest x-ray for evaluation of pneumonia.  Additionally, will plan for COVID-19 testing, though suspicion is low.  Chest x-ray reviewed.  Negative for any acute infectious etiology.  Suspect this is most likely viral URI.  COVID  testing sent.  Patient stable for discharge at this time.  Will give Flonase help with symptomatic relief. At this time, patient exhibits no emergent life-threatening condition that require further evaluation  in ED or admission. Patient had ample opportunity for questions and discussion. All patient's questions were answered with full understanding. Strict return precautions discussed. Patient expresses understanding and agreement to plan.   Gerald Lee was evaluated in Emergency Department on 04/18/2019 for the symptoms described in the history of present illness. He was evaluated in the context of the global COVID-19 pandemic, which necessitated consideration that the patient might be at risk for infection with the SARS-CoV-2 virus that causes COVID-19. Institutional protocols and algorithms that pertain to the evaluation of patients at risk for COVID-19 are in a state of rapid change based on information released by regulatory bodies including the CDC and federal and state organizations. These policies and algorithms were followed during the patient's care in the ED.  Portions of this note were generated with [employer]. Dictation errors may occur despite best attempts at proofreading.   Final Clinical Impressions(s) / ED Diagnoses   Final diagnoses:  Viral URI with cough    ED Discharge Orders         Ordered    fluticasone (FLONASE) 50 MCG/ACT nasal spray  Daily     04/18/19 1359           Desma Mcgregor 04/18/19 1540    Elnora Morrison, MD 04/18/19 [phone]

## 2019-04-18 NOTE — ED Notes (Signed)
Patient verbalizes understanding of discharge instructions. Opportunity for questioning and answers were provided. Armband removed by staff, pt discharged from ED.  

## 2019-04-19 LAB — NOVEL CORONAVIRUS, NAA (HOSP ORDER, SEND-OUT TO REF LAB; TAT 18-24 HRS): SARS-CoV-2, NAA: NOT DETECTED

## 2019-05-11 ENCOUNTER — Encounter (HOSPITAL_COMMUNITY): Payer: Self-pay

## 2019-05-11 ENCOUNTER — Emergency Department (HOSPITAL_COMMUNITY)
Admission: EM | Admit: 2019-05-11 | Discharge: 2019-05-11 | Disposition: A | Discharge status: Home / Self Care | Payer: Medicaid Other | Attending: Emergency Medicine | Admitting: Emergency Medicine

## 2019-05-11 ENCOUNTER — Other Ambulatory Visit: Payer: Self-pay

## 2019-05-11 DIAGNOSIS — Z79899 Other long term (current) drug therapy: Secondary | ICD-10-CM | POA: Insufficient documentation

## 2019-05-11 DIAGNOSIS — J45909 Unspecified asthma, uncomplicated: Secondary | ICD-10-CM | POA: Insufficient documentation

## 2019-05-11 DIAGNOSIS — B20 Human immunodeficiency virus [HIV] disease: Secondary | ICD-10-CM | POA: Insufficient documentation

## 2019-05-11 DIAGNOSIS — Z202 Contact with and (suspected) exposure to infections with a predominantly sexual mode of transmission: Secondary | ICD-10-CM | POA: Insufficient documentation

## 2019-05-11 DIAGNOSIS — Z720 Tobacco use: Secondary | ICD-10-CM | POA: Insufficient documentation

## 2019-05-11 MED ORDER — CEFTRIAXONE SODIUM 250 MG IJ SOLR
250.0000 mg | Freq: Once | INTRAMUSCULAR | Status: AC
  Administered 2019-05-11: 15:00:00 250 mg via INTRAMUSCULAR
  Filled 2019-05-11: qty 250

## 2019-05-11 MED ORDER — AZITHROMYCIN 1 G PO PACK
1.0000 g | PACK | Freq: Once | ORAL | Status: AC
  Administered 2019-05-11: 15:00:00 1 g via ORAL
  Filled 2019-05-11: qty 1

## 2019-05-11 MED ORDER — LIDOCAINE HCL (PF) 1 % IJ SOLN
INTRAMUSCULAR | Status: AC
  Filled 2019-05-11: qty 5

## 2019-05-11 NOTE — ED Provider Notes (Signed)
Gerald Lee Provider Note   CSN: [id number] Arrival date & time: 05/11/19  1320     History   Chief Complaint Chief Complaint  Patient presents with  . SEXUALLY TRANSMITTED DISEASE    HPI Gerald Lee is a 23 y.o. male.     23 year old male with past medical history of HIV (reports undetectable) presents with complaint of possible STD exposure.  Patient reports recent anal intercourse with male partner who now has urethral discharge and dysuria.  Patient denies any complaints at this time, denies rectal or urethral discharge, rectal pain or dysuria.  No other complaints or concerns.     Past Medical History:  Diagnosis Date  . Asthma   . Headache(784.0)   . HIV infection (HCC)   . Orthostatic hypotension     Patient Active Problem List   Diagnosis Date Noted  . Tendinitis of left shoulder 03/25/2019  . Muscle cramps 03/25/2019  . 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 History of syphilis 07/11/2017  . 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  . HIV (human immunodeficiency virus infection) (HCC) 05/22/2017  . Healthcare maintenance 05/22/2017  . Migraine without aura 03/14/2014  . Obesity, unspecified 03/14/2014  . Insomnia, unspecified 03/14/2014    Past Surgical History:  Procedure Laterality Date  . CIRCUMCISION  1998  . HERNIA REPAIR     Done between the ages of 89 or 8 years   . NO PAST SURGERIES          Home Medications    Prior to Admission medications   Medication Sig Start Date End Date Taking? Authorizing Provider  bacitracin ointment Apply 1 application topically 2 (two) times daily. 04/05/19   Law, Alexandra M, PA-C  BIKTARVY 50-200-25 MG TABS tablet TAKE 1 TABLET BY MOUTH DAILY 03/25/19   Blanchard Kelchixon, Stephanie N, NP  cephALEXin (KEFLEX) 500 MG capsule Take 1 capsule (500 mg total) by mouth 4 (four) times daily. 04/05/19   Law, Waylan BogaAlexandra M, PA-C  fluticasone  (FLONASE) 50 MCG/ACT nasal spray Place 1 spray into both nostrils daily. 04/18/19   Maxwell CaulLayden, Lindsey A, PA-C  hydrOXYzine (ATARAX/VISTARIL) 25 MG tablet Take 1 tablet (25 mg total) by mouth 3 (three) times daily as needed for anxiety. 06/27/17   Money, Gerlene Burdockravis B, FNP  ondansetron (ZOFRAN) 4 MG tablet Take 1 tablet (4 mg total) by mouth every 12 (twelve) hours as needed (30 minutes before antibiotic if needed to prevent nausea). 08/25/17   Blanchard Kelchixon, Stephanie N, NP  traZODone (DESYREL) 50 MG tablet Take 1-2 tablets (50-100 mg total) by mouth at bedtime as needed for sleep. Patient not taking: Reported on 03/25/2019 02/27/18   Blanchard Kelchixon, Stephanie N, NP  venlafaxine XR (EFFEXOR-XR) 75 MG 24 hr capsule TAKE ONE CAPSULE BY MOUTH DAILY WITH BREAKFAST FOR MOOD CONTROL Patient not taking: Reported on 03/25/2019 02/22/19   Blanchard Kelchixon, Stephanie N, NP    Family History Family History  Problem Relation Age of Onset  . Stroke Mother   . Hypertension Mother   . Diabetes Father     Social History Social History   Tobacco Use  . Smoking status: Light Tobacco Smoker    Packs/day: 0.10    Types: Cigarettes, Cigars  . Smokeless tobacco: Never Used  . Tobacco comment: "only when super stressed"  Substance Use Topics  . Alcohol use: Yes    Comment: occ  . Drug use: Yes    Frequency: 7.0 times per week  Types: Marijuana     Allergies   Ibuprofen   Review of Systems Review of Systems  Constitutional: Negative for fever.  Gastrointestinal: Negative for abdominal pain, anal bleeding and blood in stool.  Genitourinary: Negative for discharge, dysuria, penile pain, penile swelling and testicular pain.  Musculoskeletal: Negative for back pain.  Skin: Negative for rash and wound.  Allergic/Immunologic: Positive for immunocompromised state.  All other systems reviewed and are negative.    Physical Exam Updated Vital Signs BP 122/74   Pulse 76   Temp 98.7 F (37.1 C) (Oral)   Resp 15   Ht 6\' 4"  (1.93 m)    Wt 111.1 kg   SpO2 100%   BMI 29.82 kg/m   Physical Exam Vitals signs and nursing note reviewed. Exam conducted with a chaperone present.  Constitutional:      General: He is not in acute distress.    Appearance: He is well-developed. He is not diaphoretic.  HENT:     Head: Normocephalic and atraumatic.  Pulmonary:     Effort: Pulmonary effort is normal.  Neurological:     Mental Status: He is alert and oriented to person, place, and time.  Psychiatric:        Behavior: Behavior normal.      ED Treatments / Results  Labs (all labs ordered are listed, but only abnormal results are displayed) Labs Reviewed  GC/CHLAMYDIA PROBE AMP ([place]) NOT AT [facility]    EKG None  Radiology No results found.  Procedures Procedures (including critical care time)  Medications Ordered in ED Medications  lidocaine (PF) (XYLOCAINE) 1 % injection (has no administration in time range)  cefTRIAXone (ROCEPHIN) injection 250 mg (250 mg Intramuscular Given 05/11/19 1521)  azithromycin (ZITHROMAX) powder 1 g (1 g Oral Given 05/11/19 1518)     Initial Impression / Assessment and Plan / ED Course  I have reviewed the triage vital signs and the nursing notes.  Pertinent labs & imaging results that were available during my care of the patient were reviewed by me and considered in my medical decision making (see chart for details).  Clinical Course as of May 10 1704  Sat May 10, 1941  1940 23 year old male presents with request.  STD testing.  Patient reports new sexual partner who has urethral discharge and dysuria after anal intercourse.  Patient does not report any symptoms at this time.  Patient request to self swab.  Patient was treated with Rocephin and Zithromax and advised to follow-up with his clinic.   [LM]    Clinical Course User Index [LM] Tacy Learn, PA-C      Final Clinical Impressions(s) / ED Diagnoses   Final diagnoses:  STD exposure    ED Discharge Orders     None       Tacy Learn, PA-C 05/11/19 1705    Malvin Johns, MD 05/11/19 2007

## 2019-05-11 NOTE — ED Triage Notes (Signed)
Pt reports being the receiver in a relation ship with another male who possibly was exposed to STD. Pt is asymptomatic at this time.

## 2019-05-11 NOTE — ED Notes (Signed)
Patient verbalizes understanding of discharge instructions. Opportunity for questioning and answers were provided. Armband removed by staff, pt discharged from ED.  

## 2019-05-11 NOTE — Discharge Instructions (Signed)
Abstain from intercourse until you know your test results. Call the hospital in 3-5 days for your results. IF your gonorrhea and chlamydia tests are negative, you may resume intercourse. IF either test is positive, your partner will need to go to the health department for free treatment. IF your test is positive, both partners need to abstain from intercourse for 10 days after treatment (this includes all forms of intercourse).  ° °

## 2019-05-15 LAB — GC/CHLAMYDIA PROBE AMP (~~LOC~~) NOT AT ARMC
Chlamydia: POSITIVE — AB
Neisseria Gonorrhea: POSITIVE — AB

## 2019-07-02 IMAGING — CT CT ABD-PELV W/O CM
2 of 4 series · 16 of 46 positions shown, 18 images · non-contrast
Comparison: None.

CLINICAL DATA: Acute left flank pain.

EXAM:
CT ABDOMEN AND PELVIS WITHOUT CONTRAST
TECHNIQUE: Multidetector CT imaging of the abdomen and pelvis was performed
following the standard protocol without IV contrast.

[Series 2: axial st · axial · 0.74mm/px · z∈[-535,-110]mm · 13 of 96 slices shown, 15 images]
[im 6/96  soft-tissue]
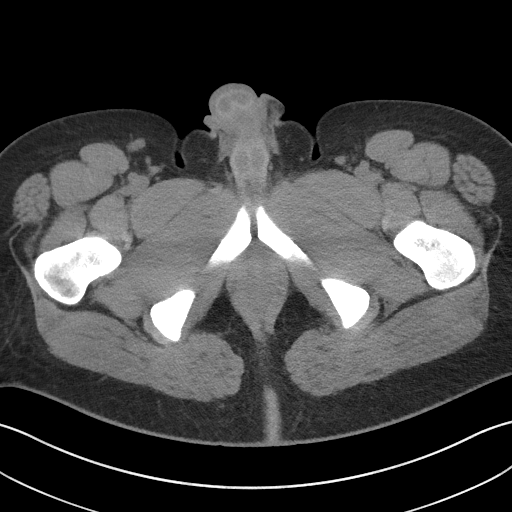
[im 6/96  bone]
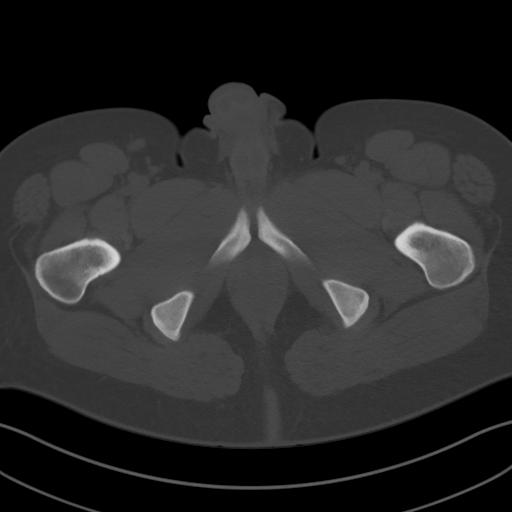
[im 16/96  soft-tissue]
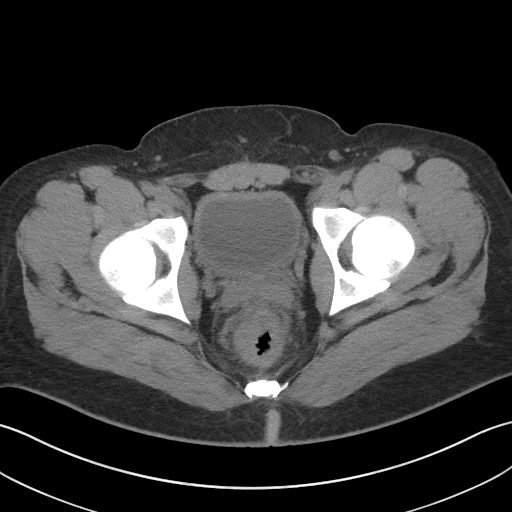
[im 21/96  soft-tissue]
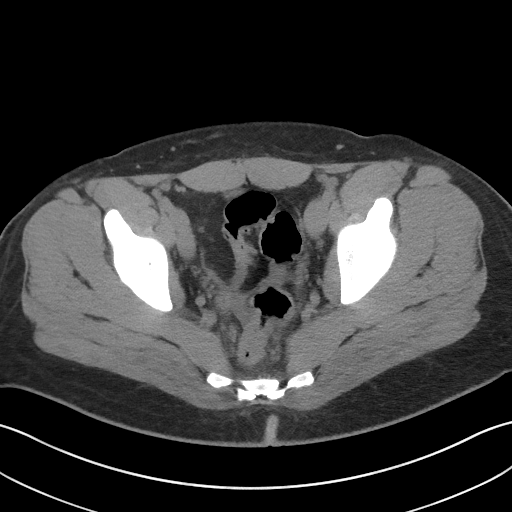
[im 26/96  soft-tissue]
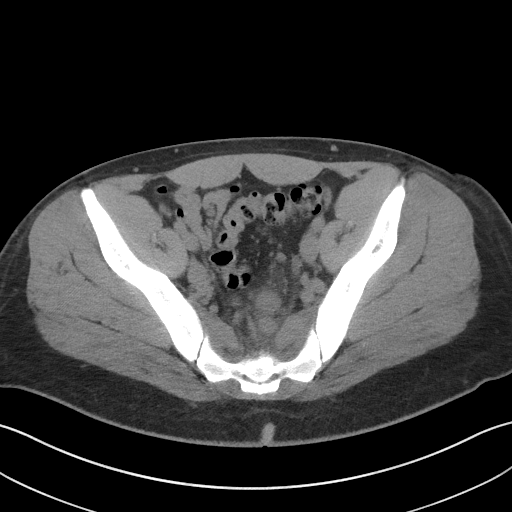
[im 36/96  soft-tissue]
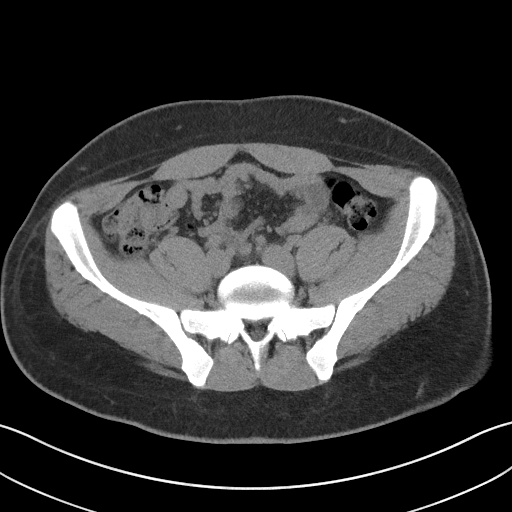
[im 41/96  soft-tissue]
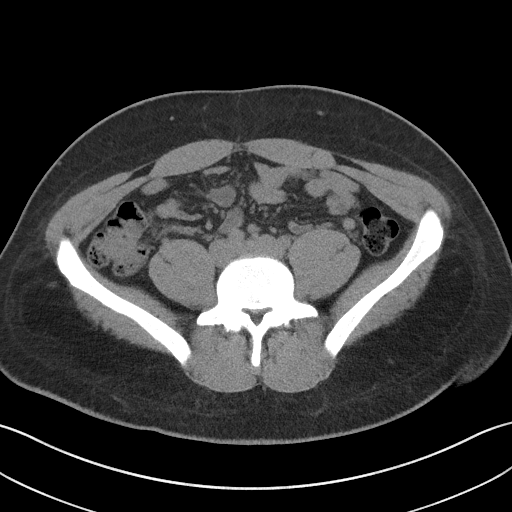
[im 51/96  soft-tissue]
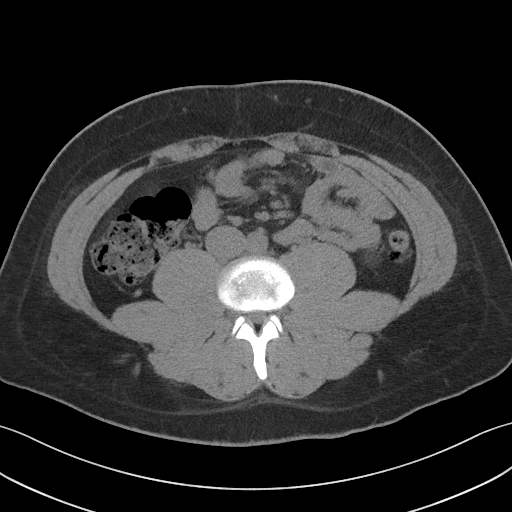
[im 56/96  soft-tissue]
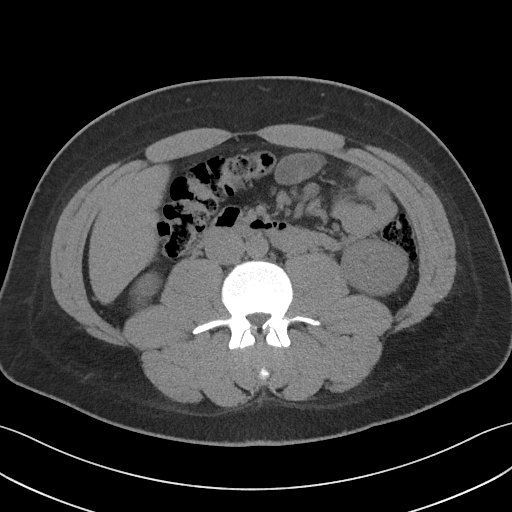
[im 61/96  soft-tissue]
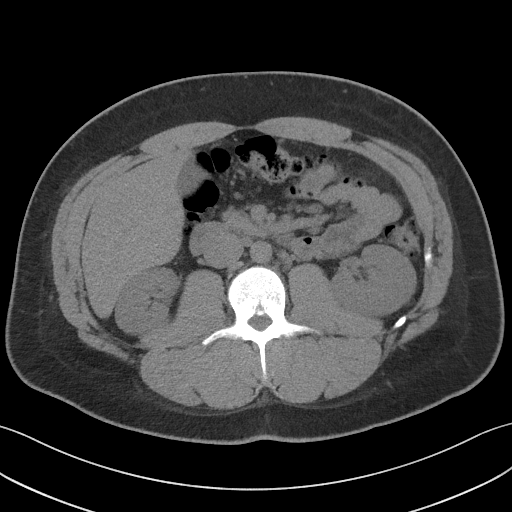
[im 61/96  bone]
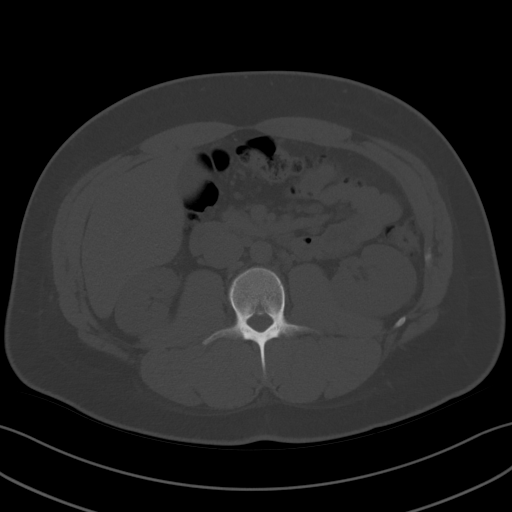
[im 71/96  soft-tissue]
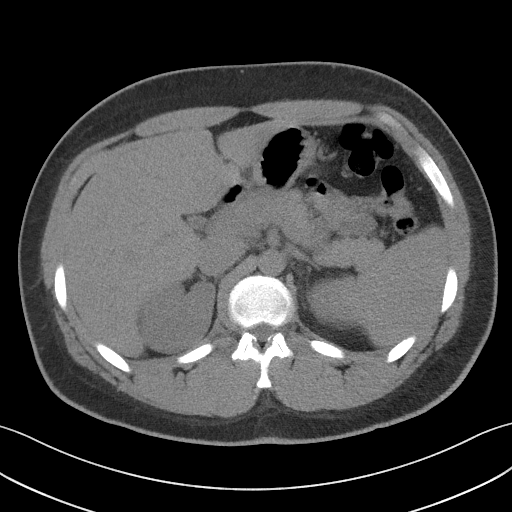
[im 76/96  soft-tissue]
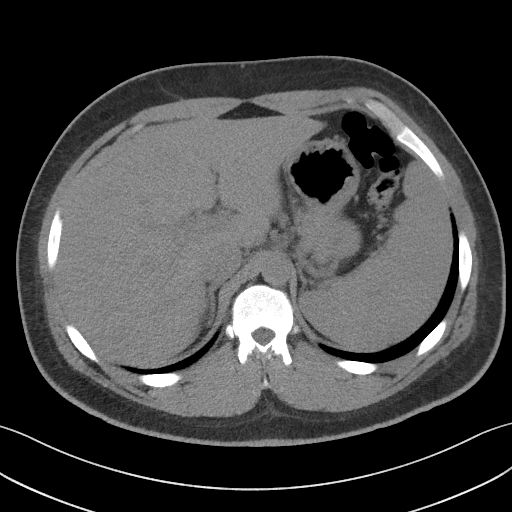
[im 81/96  soft-tissue]
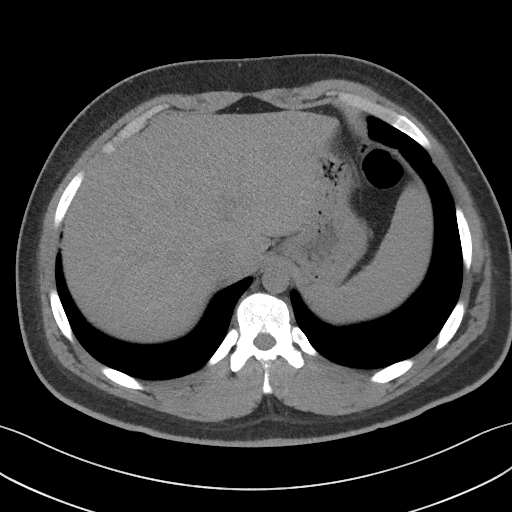
[im 91/96  soft-tissue]
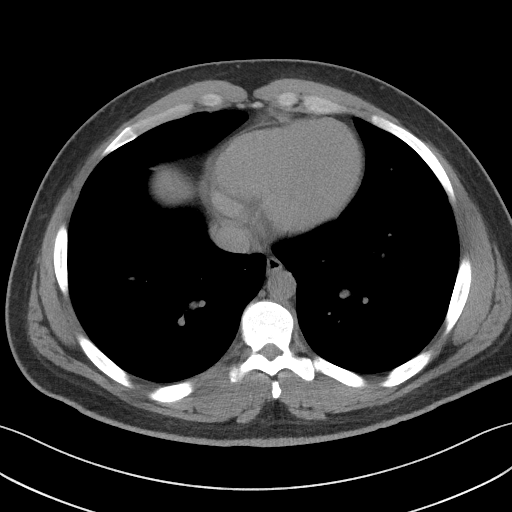

[Series 4: coronal · coronal · 0.73mm/px · 3 of 127 slices shown]
[im 43/127  soft-tissue]
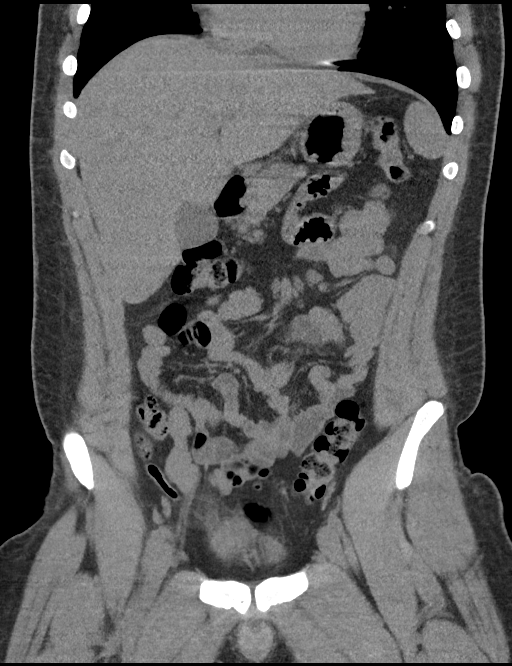
[im 57/127  soft-tissue]
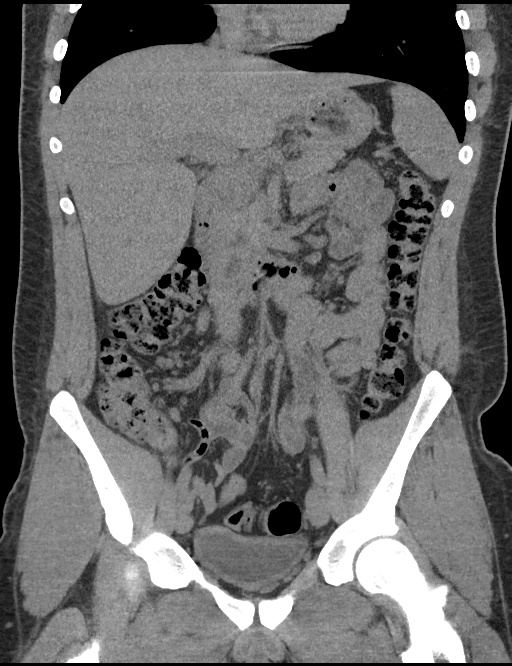
[im 71/127  soft-tissue]
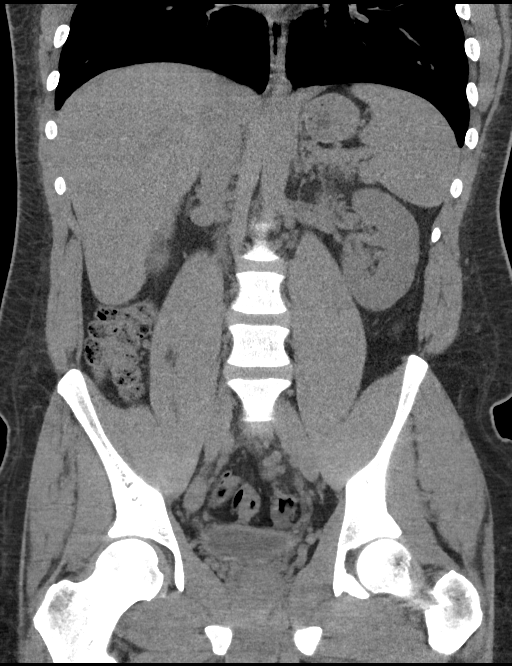

[16 of 46 positions shown; findings below may reference images not displayed]

FINDINGS: Lower chest: No acute abnormality.

Hepatobiliary: No focal liver abnormality is seen. No gallstones,
gallbladder wall thickening, or biliary dilatation.

Pancreas: Unremarkable. No pancreatic ductal dilatation or
surrounding inflammatory changes.

Spleen: Normal in size without focal abnormality.

Adrenals/Urinary Tract: Adrenal glands are unremarkable. Kidneys are
normal, without renal calculi, focal lesion, or hydronephrosis.
Bladder is unremarkable.

Stomach/Bowel: The stomach and appendix are unremarkable. There is
no evidence of bowel obstruction. There is noted wall thickening
with surrounding inflammatory changes involving the distal sigmoid
colon and rectum.

Vascular/Lymphatic: Enlarged lymph nodes are noted superior to the
rectosigmoid junction with the largest measuring 1.9 cm. It is
uncertain if these are inflammatory or neoplastic in origin. Mildly
enlarged lymph nodes are noted in the retroperitoneal region,
although they are less than 1 cm in size, and may be inflammatory or
reactive in etiology.

Reproductive: Prostate is unremarkable.

Other: No abdominal wall hernia or abnormality. No abdominopelvic
ascites.

Musculoskeletal: No acute or significant osseous findings.
IMPRESSION: There is noted wall thickening with surrounding inflammatory changes
involving the distal sigmoid colon and rectum, which may represent
proctocolitis. Significantly enlarged lymph nodes are seen just
superior and adjacent to this portion of the rectosigmoid junction
which may be inflammatory in etiology, but neoplasm or metastatic
disease cannot be excluded. Sigmoidoscopy is recommended for further
evaluation. These results will be called to the ordering clinician
or representative by the Radiologist Assistant, and communication
documented in the PACS or zVision Dashboard.

## 2019-07-16 ENCOUNTER — Other Ambulatory Visit: Payer: Self-pay

## 2019-07-16 ENCOUNTER — Emergency Department (HOSPITAL_COMMUNITY)
Admission: EM | Admit: 2019-07-16 | Discharge: 2019-07-16 | Disposition: A | Discharge status: Home / Self Care | Payer: Medicaid Other | Attending: Emergency Medicine | Admitting: Emergency Medicine

## 2019-07-16 ENCOUNTER — Emergency Department (HOSPITAL_COMMUNITY): Payer: Medicaid Other

## 2019-07-16 DIAGNOSIS — Z79899 Other long term (current) drug therapy: Secondary | ICD-10-CM | POA: Insufficient documentation

## 2019-07-16 DIAGNOSIS — Z20828 Contact with and (suspected) exposure to other viral communicable diseases: Secondary | ICD-10-CM | POA: Diagnosis not present

## 2019-07-16 DIAGNOSIS — R05 Cough: Secondary | ICD-10-CM | POA: Diagnosis present

## 2019-07-16 DIAGNOSIS — J45909 Unspecified asthma, uncomplicated: Secondary | ICD-10-CM | POA: Diagnosis not present

## 2019-07-16 DIAGNOSIS — F17210 Nicotine dependence, cigarettes, uncomplicated: Secondary | ICD-10-CM | POA: Insufficient documentation

## 2019-07-16 DIAGNOSIS — R059 Cough, unspecified: Secondary | ICD-10-CM

## 2019-07-16 DIAGNOSIS — Z21 Asymptomatic human immunodeficiency virus [HIV] infection status: Secondary | ICD-10-CM | POA: Diagnosis not present

## 2019-07-16 DIAGNOSIS — Z20822 Contact with and (suspected) exposure to covid-19: Secondary | ICD-10-CM

## 2019-07-16 LAB — SARS CORONAVIRUS 2 (TAT 6-24 HRS): SARS Coronavirus 2: NEGATIVE

## 2019-07-16 NOTE — ED Triage Notes (Signed)
Pt c/o being exposed to covid from family members- 3 that have tested positive. Denies any symptoms --

## 2019-07-16 NOTE — Discharge Instructions (Signed)
Return for new or worsening symptoms

## 2019-07-16 NOTE — ED Provider Notes (Signed)
[facility] Provider Note   CSN: [id number] Arrival date & time: 07/16/19  1445     History   Chief Complaint Chief Complaint  Patient presents with   covid exposure    HPI Gerald Lee is a 23 y.o. male with medical history significant for asthma, recurrent headache, HIV infection-last viral load undetectable who presents for evaluation of Covid exposure.  Patient states 3 family members have recently tested positive for Covid.  Patient states he has had a "mild cough" which is been nonproductive.  He denies fever, chills, nausea, vomiting, headache, lateral weakness, congestion, rhinorrhea, sore throat, neck pain, neck stiffness, chest pain, shortness breath, hemoptysis, abdominal pain, diarrhea, dysuria.  Patient states he is followed by Dr. Doren Custard with infectious disease and his last viral loads were undetectable 3 months ago.  He is compliant with his HIV medications.  Denies any rashes or lesions.  Has not taken anything for his symptoms.  Patient states he was not going to come here however when he called his PCP he was told he need to be tested due to his HIV history.  He denies additional aggravating or alleviating factors.  History obtained from patient and past medical records.  No interpreter is used.     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 Authorizing Provider  bacitracin ointment Apply 1 application topically 2 (two) times daily. 04/05/19   Law, Alexandra M, PA-C  BIKTARVY 50-200-25 MG TABS tablet TAKE 1 TABLET BY MOUTH DAILY 03/25/19   [place] Callas, NP  cephALEXin (KEFLEX) 500 MG capsule Take 1 capsule (500 mg total) by mouth 4 (four) times daily. 04/05/19   Law, Bea Graff, PA-C  fluticasone (FLONASE) 50 MCG/ACT nasal spray Place 1 spray into both nostrils daily. 04/18/19   Volanda Napoleon, PA-C  hydrOXYzine (ATARAX/VISTARIL) 25 MG tablet Take 1 tablet (25 mg total) by mouth 3 (three) times daily as needed for anxiety. 06/27/17   Money, Lowry Ram, FNP  ondansetron (ZOFRAN) 4 MG tablet Take 1 tablet (4 mg total) by mouth every 12 (twelve) hours as needed (30 minutes before antibiotic if needed to prevent nausea). 08/25/17   [place] Callas, NP  traZODone (DESYREL) 50 MG tablet Take 1-2 tablets (50-100 mg total) by mouth at bedtime as needed for sleep. Patient not taking: Reported on 03/25/2019 02/27/18   [place] Callas, NP  venlafaxine XR (EFFEXOR-XR) 75 MG 24 hr capsule TAKE ONE CAPSULE BY MOUTH DAILY WITH BREAKFAST FOR MOOD CONTROL Patient not taking: Reported on  03/25/2019 02/22/19   Blanchard Kelch, NP    Family History Family History  Problem Relation Age of Onset   Stroke Mother    Hypertension Mother    Diabetes Father     Social History Social History   Tobacco Use   Smoking status: Light Tobacco Smoker    Packs/day: 0.10    Types: Cigarettes, Cigars   Smokeless tobacco: Never Used   Tobacco comment: "only when super stressed"  Substance Use Topics   Alcohol use: Yes    Comment: occ   Drug use: Yes    Frequency: 7.0  times per week    Types: Marijuana     Allergies   Ibuprofen   Review of Systems Review of Systems  Constitutional: Negative.   HENT: Negative.   Respiratory: Positive for cough. Negative for apnea, choking, chest tightness, shortness of breath, wheezing and stridor.   Cardiovascular: Negative.   Gastrointestinal: Negative.   Genitourinary: Negative.   Musculoskeletal: Negative.   Skin: Negative.   Neurological: Negative.   All other systems reviewed and are negative.    Physical Exam Updated Vital Signs BP 125/70 (BP Location: Right Arm)    Pulse 71    Temp 98.2 F (36.8 C) (Oral)    Resp 18    SpO2 100%   Physical Exam Vitals signs and nursing note reviewed.  Constitutional:      General: He is not in acute distress.    Appearance: He is not ill-appearing, toxic-appearing or diaphoretic.  HENT:     Head: Normocephalic and atraumatic.     Jaw: There is normal jaw occlusion.     Right Ear: Tympanic membrane, ear canal and external ear normal. There is no impacted cerumen. No hemotympanum. Tympanic membrane is not injected, scarred, perforated, erythematous, retracted or bulging.     Left Ear: Tympanic membrane, ear canal and external ear normal. There is no impacted cerumen. No hemotympanum. Tympanic membrane is not injected, scarred, perforated, erythematous, retracted or bulging.     Ears:     Comments: No Mastoid tenderness.    Nose:     Comments: No rhinorrhea and congestion to bilateral nares.  No sinus tenderness.    Mouth/Throat:     Comments: Posterior oropharynx clear.  Mucous membranes moist.  Tonsils without erythema or exudate.  Uvula midline without deviation.  No evidence of PTA or RPA.  No drooling, dysphasia or trismus.  Phonation normal. Neck:     Trachea: Trachea and phonation normal.     Meningeal: Brudzinski's sign and Kernig's sign absent.     Comments: No Neck stiffness or neck rigidity.  No meningismus.  No cervical  lymphadenopathy. Cardiovascular:     Comments: No murmurs rubs or gallops. Pulmonary:     Comments: Clear to auscultation bilaterally without wheeze, rhonchi or rales.  No accessory muscle usage.  Able speak in full sentences. Abdominal:     Comments: Soft, nontender without rebound or guarding.  No CVA tenderness.  Musculoskeletal:     Comments: Moves all 4 extremities without difficulty.  Lower extremities without edema, erythema or warmth.  Skin:    Comments: Brisk capillary refill.  No rashes or lesions.  Neurological:     Mental Status: He is alert.     Cranial Nerves: Cranial nerves are intact.     Sensory: Sensation is intact.     Comments: Ambulatory in department without difficulty.  Cranial nerves II through XII grossly intact.  No facial droop.  Negative  finger to nose, rhomberg. Equal strength bilaterally.    ED Treatments / Results  Labs (all labs ordered are listed, but only abnormal results are displayed) Labs Reviewed  SARS CORONAVIRUS 2 (TAT 6-24 HRS)    EKG None  Radiology Dg Chest Portable 1 View  Result Date: 07/16/2019 CLINICAL DATA:  COVID exposure, cough, HIV EXAM: PORTABLE CHEST 1 VIEW COMPARISON:  04/18/2019 FINDINGS: Mild cardiomegaly. Both lungs are clear. The visualized skeletal structures are unremarkable. IMPRESSION: Mild cardiomegaly without acute abnormality of the lungs in AP portable projection. Electronically Signed   By: Lauralyn PrimesAlex  Bibbey M.D.   On: 07/16/2019 17:06    Procedures Procedures (including critical care time)  Medications Ordered in ED Medications - No data to display  Initial Impression / Assessment and Plan / ED Course  I have reviewed the triage vital signs and the nursing notes.  Pertinent labs & imaging results that were available during my care of the patient were reviewed by me and considered in my medical decision making (see chart for details).  23 year old presents for evaluation of COVID exposure and mild cough.  Afebrile, non septic non ill appearing. Hx of HIV compliant with meds and last viral load undetectable. Heart and Lungs clear. Abd soft. NV intact without deficets. Toelrating PO intake at home without difficuty. No evidence of DVT or acute fluid overload on exam. Plan for COVID test and Chest xray given HIV hx.  Plain film chest without acute findings. Patients symptoms are consistent with URI, likely viral etiology. Discussed that antibiotics are not indicated for viral infections. Pt will be discharged with symptomatic treatment.  Verbalizes understanding and is agreeable with plan. Pt is hemodynamically stable & in NAD prior to dc.  The patient has been appropriately medically screened and/or stabilized in the ED. I have low suspicion for any other emergent medical condition which would require further screening, evaluation or treatment in the ED or require inpatient management.        Gerald Lee was evaluated in Emergency Department on 07/16/2019 for the symptoms described in the history of present illness. He was evaluated in the context of the global COVID-19 pandemic, which necessitated consideration that the patient might be at risk for infection with the SARS-CoV-2 virus that causes COVID-19. Institutional protocols and algorithms that pertain to the evaluation of patients at risk for COVID-19 are in a state of rapid change based on information released by regulatory bodies including the CDC and federal and state organizations. These policies and algorithms were followed during the patient's care in the ED.  Final Clinical Impressions(s) / ED Diagnoses   Final diagnoses:  Close exposure to COVID-19 virus  Cough    ED Discharge Orders    None       Nesta Kimple A, PA-C 07/16/19 1720    Jacalyn LefevreHaviland, Julie, MD 07/16/19 1735

## 2019-08-05 ENCOUNTER — Ambulatory Visit: Payer: Medicaid Other | Admitting: Infectious Diseases

## 2019-08-06 ENCOUNTER — Ambulatory Visit: Payer: Medicaid Other | Admitting: Infectious Diseases

## 2019-08-14 ENCOUNTER — Encounter (HOSPITAL_COMMUNITY): Payer: Self-pay

## 2019-08-14 ENCOUNTER — Emergency Department (HOSPITAL_COMMUNITY)
Admission: EM | Admit: 2019-08-14 | Discharge: 2019-08-14 | Disposition: A | Discharge status: Home / Self Care | Payer: Medicaid Other | Attending: Emergency Medicine | Admitting: Emergency Medicine

## 2019-08-14 ENCOUNTER — Other Ambulatory Visit: Payer: Self-pay

## 2019-08-14 DIAGNOSIS — F17210 Nicotine dependence, cigarettes, uncomplicated: Secondary | ICD-10-CM | POA: Insufficient documentation

## 2019-08-14 DIAGNOSIS — Z20822 Contact with and (suspected) exposure to covid-19: Secondary | ICD-10-CM

## 2019-08-14 DIAGNOSIS — Z20828 Contact with and (suspected) exposure to other viral communicable diseases: Secondary | ICD-10-CM | POA: Insufficient documentation

## 2019-08-14 DIAGNOSIS — Z79899 Other long term (current) drug therapy: Secondary | ICD-10-CM | POA: Insufficient documentation

## 2019-08-14 DIAGNOSIS — R05 Cough: Secondary | ICD-10-CM | POA: Insufficient documentation

## 2019-08-14 DIAGNOSIS — R519 Headache, unspecified: Secondary | ICD-10-CM | POA: Insufficient documentation

## 2019-08-14 DIAGNOSIS — Z21 Asymptomatic human immunodeficiency virus [HIV] infection status: Secondary | ICD-10-CM | POA: Insufficient documentation

## 2019-08-14 DIAGNOSIS — J45909 Unspecified asthma, uncomplicated: Secondary | ICD-10-CM | POA: Insufficient documentation

## 2019-08-14 NOTE — ED Provider Notes (Signed)
[facility] EMERGENCY DEPARTMENT Provider Note   CSN: [id number] Arrival date & time: 08/14/19  1804    History Chief Complaint  Patient presents with  . Headache  . Cough   Gerald Lee is a 23 y.o. male with medical history significant for HIV, asthma, chronic headaches, last HIV load undetectable who presents for evaluation of Covid exposure.  Actually saw this patient 1 month ago for Covid exposure.  Patient states his mother tested positive for Covid and he has been living with her.  She tested positive for Covid approximately 10 days ago.  Had a mild headache 2 days ago however none since.  Denies sudden onset thunderclap headache, facial droop, weakness.  States this felt like his typical headache and resolved with Tylenol.  He denies any complaints at this time, no fever, chills, nausea, vomiting, lateral weakness, congestion, rhinorrhea, sore throat, neck pain, chest pain, shortness of breath, hemoptysis, domino pain, diarrhea, dysuria.  Denies productive cough.  Compliant with his HIV medications.  Patient states he would like to return to work however cannot due to his Covid exposure with his mother until he has a negative test.  He has been able to tolerate p.o. intake without difficulty at home.  Denies additional aggravating or alleviating factors.  History obtained from patient and past medical records.  No interpreter is used.  HPI     Past Medical History:  Diagnosis Date  . Asthma   . Headache(784.0)   . HIV infection ([facility])   . Orthostatic hypotension     Patient Active Problem List   Diagnosis Date Noted  . Tendinitis of left shoulder 03/25/2019  . Muscle cramps 03/25/2019  . 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 History of syphilis 07/11/2017  . 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  . HIV (human immunodeficiency virus infection) ([facility]) 05/22/2017  . Healthcare maintenance 05/22/2017    . Migraine without aura 03/14/2014  . Obesity, unspecified 03/14/2014  . Insomnia, unspecified 03/14/2014    Past Surgical History:  Procedure Laterality Date  . CIRCUMCISION  1998  . HERNIA REPAIR     Done between the ages of 58 or 8 years   . NO PAST SURGERIES         Family History  Problem Relation Age of Onset  . Stroke Mother   . Hypertension Mother   . Diabetes Father     Social History   Tobacco Use  . Smoking status: Light Tobacco Smoker    Packs/day: 0.10    Types: Cigarettes, Cigars  . Smokeless tobacco: Never Used  . Tobacco comment: "only when super stressed"  Substance Use Topics  . Alcohol use: Yes    Comment: occ  . Drug use: Yes    Frequency: 7.0 times per week    Types: Marijuana    Home Medications Prior to Admission medications   Medication Sig Start Date End Date Taking? Authorizing Provider  bacitracin ointment Apply 1 application topically 2 (two) times daily. 04/05/19   Law, Alexandra M, PA-C  BIKTARVY 50-200-25 MG TABS tablet TAKE 1 TABLET BY MOUTH DAILY 03/25/19   Blanchard Kelch, NP  cephALEXin (KEFLEX) 500 MG capsule Take 1 capsule (500 mg total) by mouth 4 (four) times daily. 04/05/19   Law, Waylan Boga, PA-C  fluticasone (FLONASE) 50 MCG/ACT nasal spray Place 1 spray into both nostrils daily. 04/18/19   Graciella Freer A, PA-C  hydrOXYzine (ATARAX/VISTARIL) 25 MG tablet Take 1 tablet (  25 mg total) by mouth 3 (three) times daily as needed for anxiety. 06/27/17   Money, Gerlene Burdockravis B, FNP  ondansetron (ZOFRAN) 4 MG tablet Take 1 tablet (4 mg total) by mouth every 12 (twelve) hours as needed (30 minutes before antibiotic if needed to prevent nausea). 08/25/17   Blanchard Kelchixon, Stephanie N, NP  traZODone (DESYREL) 50 MG tablet Take 1-2 tablets (50-100 mg total) by mouth at bedtime as needed for sleep. Patient not taking: Reported on 03/25/2019 02/27/18   Blanchard Kelchixon, Stephanie N, NP  venlafaxine XR (EFFEXOR-XR) 75 MG 24 hr capsule TAKE ONE CAPSULE BY MOUTH DAILY WITH  BREAKFAST FOR MOOD CONTROL Patient not taking: Reported on 03/25/2019 02/22/19   Blanchard Kelchixon, Stephanie N, NP    Allergies    Ibuprofen  Review of Systems   Review of Systems  Constitutional: Negative.   HENT: Negative.   Eyes: Negative.   Respiratory: Negative.   Cardiovascular: Negative.   Gastrointestinal: Negative.   Endocrine: Negative.   Genitourinary: Negative.   Musculoskeletal: Negative.   Skin: Negative.  Negative for color change.  Neurological: Positive for headaches (3 days ago, non currently).  Hematological: Negative.   All other systems reviewed and are negative.   Physical Exam Updated Vital Signs BP 125/70 (BP Location: Left Arm)   Pulse 88   Temp 99 F (37.2 C) (Oral)   Resp 18   Ht 6\' 4"  (1.93 m)   Wt 120.2 kg   SpO2 100%   BMI 32.26 kg/m   Physical Exam Physical Exam  Constitutional: Pt is oriented to person, place, and time. Pt appears well-developed and well-nourished. No distress.  HENT:  Head: Normocephalic and atraumatic.  Mouth/Throat: Oropharynx is clear and moist.  Eyes: Conjunctivae and EOM are normal. Pupils are equal, round, and reactive to light. No scleral icterus.  No horizontal, vertical or rotational nystagmus  Neck: Normal range of motion. Neck supple.  Full active and passive ROM without pain No midline or paraspinal tenderness No nuchal rigidity or meningeal signs  Cardiovascular: Normal rate, regular rhythm and intact distal pulses.   Pulmonary/Chest: Effort normal and breath sounds normal. No respiratory distress. Pt has no wheezes. No rales.  Abdominal: Soft. Bowel sounds are normal. There is no tenderness. There is no rebound and no guarding.  Musculoskeletal: Normal range of motion.  Lymphadenopathy:    No cervical adenopathy.  Neurological: Pt. is alert and oriented to person, place, and time. He has normal reflexes. No cranial nerve deficit.  Exhibits normal muscle tone. Coordination normal.  Mental Status:  Alert,  oriented, thought content appropriate. Speech fluent without evidence of aphasia. Able to follow 2 step commands without difficulty.  Cranial Nerves:  II:  Peripheral visual fields grossly normal, pupils equal, round, reactive to light III,IV, VI: ptosis not present, extra-ocular motions intact bilaterally  V,VII: smile symmetric, facial light touch sensation equal VIII: hearing grossly normal bilaterally  IX,X: midline uvula rise  XI: bilateral shoulder shrug equal and strong XII: midline tongue extension  Motor:  5/5 in upper and lower extremities bilaterally including strong and equal grip strength and dorsiflexion/plantar flexion Sensory: Pinprick and light touch normal in all extremities.  Deep Tendon Reflexes: 2+ and symmetric  Cerebellar: normal finger-to-nose with bilateral upper extremities Gait: normal gait and balance CV: distal pulses palpable throughout   Skin: Skin is warm and dry. No rash noted. Pt is not diaphoretic.  Psychiatric: Pt has a normal mood and affect. Behavior is normal. Judgment and thought content normal.  Nursing  note and vitals reviewed. ED Results / Procedures / Treatments   Labs (all labs ordered are listed, but only abnormal results are displayed) Labs Reviewed  NOVEL CORONAVIRUS, NAA (HOSP ORDER, SEND-OUT TO REF LAB; TAT 18-24 HRS)    EKG None  Radiology No results found.  Procedures Procedures (including critical care time)  Medications Ordered in ED Medications - No data to display  ED Course  I have reviewed the triage vital signs and the nursing notes.  Pertinent labs & imaging results that were available during my care of the patient were reviewed by me and considered in my medical decision making (see chart for details).  23 year old patient appears otherwise well, history of HIV, compliant with his medications, last viral load undetectable presents for evaluation of Covid exposure.  Mother tested +10 days ago.  Patient is  asymptomatic except for a headache he had 3 days ago.  States was his "typical" headache, relived with Tylenol.  He has a nonfocal neurologic exam without deficits.  No sudden onset HA, neck rigidity, stiffness, vision changes. No current headache.  He is tolerating p.o. intake at home.  Heart and lungs clear.  He is afebrile, nonseptic, not ill-appearing.  No tachycardia, tachypnea or hypoxia.  Patient will receive outpatient Covid test.  If he develops any new worsening symptoms he can return to the emergency department.  The patient has been appropriately medically screened and/or stabilized in the ED. I have low suspicion for any other emergent medical condition which would require further screening, evaluation or treatment in the ED or require inpatient management.  Patient is hemodynamically stable and in no acute distress.  Patient able to ambulate in department prior to ED.  Evaluation does not show acute pathology that would require ongoing or additional emergent interventions while in the emergency department or further inpatient treatment.  I have discussed the diagnosis with the patient and answered all questions.  Pain is been managed while in the emergency department and patient has no further complaints prior to discharge.  Patient is comfortable with plan discussed in room and is stable for discharge at this time.  I have discussed strict return precautions for returning to the emergency department.  Patient was encouraged to follow-up with PCP/specialist refer to at discharge.    MDM Rules/Calculators/A&P                      RAQUAN IANNONE was evaluated in Emergency Department on 08/14/2019 for the symptoms described in the history of present illness. He was evaluated in the context of the global COVID-19 pandemic, which necessitated consideration that the patient might be at risk for infection with the SARS-CoV-2 virus that causes COVID-19. Institutional protocols and algorithms that  pertain to the evaluation of patients at risk for COVID-19 are in a state of rapid change based on information released by regulatory bodies including the CDC and federal and state organizations. These policies and algorithms were followed during the patient's care in the ED. Final Clinical Impression(s) / ED Diagnoses Final diagnoses:  Close exposure to COVID-19 virus    Rx / DC Orders ED Discharge Orders    None       Sahithi Ordoyne A, PA-C 08/14/19 1856    Carmin Muskrat, MD 08/14/19 2304

## 2019-08-14 NOTE — ED Notes (Signed)
Patient Alert and oriented to baseline. Stable and ambulatory to baseline. Patient verbalized understanding of the discharge instructions.  Patient belongings were taken by the patient.   

## 2019-08-14 NOTE — ED Triage Notes (Signed)
Pt arrives POV for eval of likely covid. Pt reports he has been quarantining w/ his mother who is covid positive. Pt states that he is experiencing slight headaches and cough. States he "just wants to know if he has it". Has not gone to outpt testing sites

## 2019-08-14 NOTE — Discharge Instructions (Addendum)
You need to be out of work until your Covid test has returned.  If positive you will need stay out of  work.

## 2019-08-16 LAB — NOVEL CORONAVIRUS, NAA (HOSP ORDER, SEND-OUT TO REF LAB; TAT 18-24 HRS): SARS-CoV-2, NAA: NOT DETECTED

## 2019-09-10 ENCOUNTER — Other Ambulatory Visit: Payer: Self-pay

## 2019-09-10 ENCOUNTER — Encounter: Payer: Self-pay | Admitting: Infectious Diseases

## 2019-09-10 ENCOUNTER — Ambulatory Visit (INDEPENDENT_AMBULATORY_CARE_PROVIDER_SITE_OTHER): Payer: Medicaid Other | Admitting: Infectious Diseases

## 2019-09-10 VITALS — Wt 303.0 lb

## 2019-09-10 DIAGNOSIS — Z6835 Body mass index (BMI) 35.0-35.9, adult: Secondary | ICD-10-CM

## 2019-09-10 DIAGNOSIS — Z713 Dietary counseling and surveillance: Secondary | ICD-10-CM | POA: Insufficient documentation

## 2019-09-10 DIAGNOSIS — E6609 Other obesity due to excess calories: Secondary | ICD-10-CM

## 2019-09-10 DIAGNOSIS — Z8619 Personal history of other infectious and parasitic diseases: Secondary | ICD-10-CM | POA: Diagnosis not present

## 2019-09-10 DIAGNOSIS — Z0000 Encounter for general adult medical examination without abnormal findings: Secondary | ICD-10-CM

## 2019-09-10 DIAGNOSIS — Z21 Asymptomatic human immunodeficiency virus [HIV] infection status: Secondary | ICD-10-CM

## 2019-09-10 DIAGNOSIS — G4700 Insomnia, unspecified: Secondary | ICD-10-CM

## 2019-09-10 MED ORDER — BIKTARVY 50-200-25 MG PO TABS: 1.0000 | ORAL_TABLET | Freq: Every day | ORAL | 11 refills | Status: DC

## 2019-09-10 NOTE — Assessment & Plan Note (Signed)
Naythen has had well-controlled HIV on current regimen of Biktarvy for nearly 3 years.  He has had no side effects and is tolerating it well but still has trouble remembering taking his medication especially when he gets stressed.  We discussed consideration and referral to ACTG team for long-acting injectable study.  He is very excited to hear more information about this and I think he would be a great candidate for the study. We will update viral load and CD4 count today. He can return to clinic for follow-up with me in 4 months.

## 2019-09-10 NOTE — Patient Instructions (Addendum)
[facility] [email or web address] to help with understanding low carb and keto diet changes.   Please call Claris Che @ [phone] to schedule a dental appointment   Please continue your Biktarvy every day like you do.   We gave you your flu shot today.   Will see you back in 4 months with labs at your visit

## 2019-09-10 NOTE — Assessment & Plan Note (Signed)
Wt Readings from Last 3 Encounters:  09/10/19 (!) 303 lb (137.4 kg)  08/14/19 265 lb (120.2 kg)  05/11/19 245 lb (111.1 kg)   Gerald Lee has gained about 60 pounds since September.  This is pretty significant increase over a very short timeframe.  We discussed different dietary options to try but ultimately will work as what ever he is able to stick with.  I gave him some resources regarding the keto or low-carb lifestyle approach which he was interested in today.  I think this would do well for him.  I do recommend weight loss for overall health and wellness in the long-term.  He will also begin a walking program at home which she finds helpful under periods of stress.

## 2019-09-10 NOTE — Assessment & Plan Note (Signed)
Last treated in May 2020.  Will repeat RPR today.  No signs or symptoms of active infection.  Declines STI testing today.

## 2019-09-10 NOTE — Assessment & Plan Note (Signed)
Continue as needed trazodone.  Seems to be working well when he needs it.

## 2019-09-10 NOTE — Assessment & Plan Note (Signed)
Flu shot today 

## 2019-09-10 NOTE — Progress Notes (Signed)
Name: Gerald Lee  DOB: 05/15/1996  MRN: [id number] PCP: Nolene Ebbs, MD    Patient Active Problem List   Diagnosis Date Noted  . Weight loss counseling, encounter for 09/10/2019  . Muscle cramps 03/25/2019  . 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 History of syphilis 07/11/2017  . Moderate episode of recurrent major depressive disorder ([facility]) 06/21/2017  . HIV (human immunodeficiency virus infection) ([facility]) 05/22/2017  . Healthcare maintenance 05/22/2017  . Migraine without aura 03/14/2014  . Obesity, unspecified 03/14/2014  . Insomnia, unspecified 03/14/2014    Brief Narrative:  Gerald Lee is a 24 y.o. AA male with well controlled HIV, Dx 04-2017 with CD4 nadir 780.  Hep B sAg (-) High Risk: MSM.  Previous OIs: none  Previous Regimens:   Biktarvy 2018 --> suppressed   Genotype:   02/2017 - no mutations   Subjective:  CC: HIV care and follow up.    HPI:  Gerald Lee has been in good health since her last office visit.  Gerald Lee has been to the ER a few times for viral-like illnesses and STD exposure.  Gerald Lee mother and sister contracted Covid but Gerald Lee never showed signs of infection.  Gerald Lee tested negative during ER visit.  Gerald Lee is currently out of work but awaiting Gerald Lee driver's license so Gerald Lee can have some opportunities that employment.  Gerald Lee has 1 partner currently whom Gerald Lee is visiting in [place] tomorrow.  Gerald Lee is little concerned about Gerald Lee weight gain and would like to discuss options. Gerald Lee has done well with Gerald Lee Biktarvy and has not missed a single dose.  Gerald Lee does admit that sometimes taking a pill every day and trying to remember is challenging.   Review of Systems  Constitutional: Negative for chills, fever, malaise/fatigue and weight loss.  HENT: Negative for sore throat.        No dental problems  Respiratory: Negative for cough and sputum production.   Cardiovascular: Negative for chest pain and leg swelling.  Gastrointestinal: Negative for abdominal pain,  diarrhea and vomiting.  Genitourinary: Negative for dysuria and flank pain.  Musculoskeletal: Negative for joint pain, myalgias and neck pain.  Skin: Negative for rash.  Neurological: Negative for dizziness, tingling and headaches.  Psychiatric/Behavioral: Negative for depression and substance abuse. The patient is not nervous/anxious and does not have insomnia.     Past Medical History:  Diagnosis Date  . Asthma   . Headache(784.0)   . HIV infection ([facility])   . Orthostatic hypotension     Social History   Tobacco Use  . Smoking status: Light Tobacco Smoker    Packs/day: 0.10    Types: Cigarettes, Cigars  . Smokeless tobacco: Never Used  . Tobacco comment: "only when super stressed"  Substance Use Topics  . Alcohol use: Yes    Comment: occ; drink daily  . Drug use: Yes    Frequency: 7.0 times per week    Types: Marijuana    Comment: occassionally    Allergies  Allergen Reactions  . Ibuprofen Other (See Comments)    Per patient "can not take because it will shut down my kidneys"    Objective:  Vitals:   09/10/19 1604  Weight: (!) 303 lb (137.4 kg)   Body mass index is 36.88 kg/m.  Physical Exam Constitutional:      Appearance: Gerald Lee is well-developed.     Comments: Seated comfortably in chair during visit.   HENT:     Mouth/Throat:     Dentition: Normal dentition. No  dental abscesses.  Cardiovascular:     Rate and Rhythm: Normal rate and regular rhythm.     Heart sounds: Normal heart sounds.  Pulmonary:     Effort: Pulmonary effort is normal.     Breath sounds: Normal breath sounds.  Abdominal:     General: There is no distension.     Palpations: Abdomen is soft.     Tenderness: There is no abdominal tenderness.  Lymphadenopathy:     Cervical: No cervical adenopathy.  Skin:    General: Skin is warm and dry.     Findings: No rash.  Neurological:     Mental Status: Gerald Lee is alert and oriented to person, place, and time.  Psychiatric:        Judgment:  Judgment normal.     Comments: In good spirits today and engaged in care discussion.       Lab Results Lab Results  Component Value Date   WBC 7.2 03/11/2019   HGB 16.1 03/11/2019   HCT 48.5 03/11/2019   MCV 87.1 03/11/2019   PLT 242 03/11/2019    Lab Results  Component Value Date   CREATININE 1.17 03/11/2019   BUN 12 03/11/2019   NA 140 03/11/2019   K 4.5 03/11/2019   CL 104 03/11/2019   CO2 29 03/11/2019    Lab Results  Component Value Date   ALT 13 03/11/2019   AST 14 03/11/2019   ALKPHOS 113 01/06/2019   BILITOT 0.5 03/11/2019    Lab Results  Component Value Date   CHOL 132 03/11/2019   HDL 39 (L) 03/11/2019   LDLCALC 78 03/11/2019   TRIG 66 03/11/2019   CHOLHDL 3.4 03/11/2019   HIV 1 RNA Quant (copies/mL)  Date Value  03/11/2019 <20 NOT DETECTED  10/29/2018 <20 DETECTED (A)  06/11/2018 <20 NOT DETECTED   CD4 T Cell Abs (/uL)  Date Value  03/11/2019 968  06/11/2018 1,250  02/16/2018 1,260   Lab Results  Component Value Date   HAV REACTIVE (A) 05/22/2017   Lab Results  Component Value Date   HEPBSAG NON-REACTIVE 05/22/2017   HEPBSAB REACTIVE (A) 03/11/2019    Problem List Items Addressed This Visit      Unprioritized   Weight loss counseling, encounter for   Obesity, unspecified (Chronic)    Wt Readings from Last 3 Encounters:  09/10/19 (!) 303 lb (137.4 kg)  08/14/19 265 lb (120.2 kg)  05/11/19 245 lb (111.1 kg)   Gerald Lee has gained about 60 pounds since September.  This is pretty significant increase over a very short timeframe.  We discussed different dietary options to try but ultimately will work as what ever Gerald Lee is able to stick with.  I gave him some resources regarding the keto or low-carb lifestyle approach which Gerald Lee was interested in today.  I think this would do well for him.  I do recommend weight loss for overall health and wellness in the long-term.  Gerald Lee will also begin a walking program at home which she finds helpful under periods  of stress.      Insomnia, unspecified (Chronic)    Continue as needed trazodone.  Seems to be working well when Gerald Lee needs it.      HIV (human immunodeficiency virus infection) ([facility]) - Primary (Chronic)    Gerald Lee has had well-controlled HIV on current regimen of Biktarvy for nearly 3 years.  Gerald Lee has had no side effects and is tolerating it well but still has trouble remembering taking Gerald Lee medication  especially when Gerald Lee gets stressed.  We discussed consideration and referral to [facility] team for long-acting injectable study.  Gerald Lee is very excited to hear more information about this and I think Gerald Lee would be a great candidate for the study. We will update viral load and CD4 count today. Gerald Lee can return to clinic for follow-up with me in 4 months.      Relevant Medications   bictegravir-emtricitabine-tenofovir AF (BIKTARVY) 50-200-25 MG TABS tablet   Other Relevant Orders   HIV 1 RNA quant-no reflex-bld   T-helper cell (CD4)- ([facility] clinic only)   RPR   History of syphilis    Last treated in May 2020.  Will repeat RPR today.  No signs or symptoms of active infection.  Declines STI testing today.      Healthcare maintenance    Flu shot today        Rexene Alberts, MSN, NP-C [facility] for Infectious Disease [facility] Pager: [phone]  09/10/19

## 2019-09-11 LAB — T-HELPER CELL (CD4) - (RCID CLINIC ONLY)
CD4 % Helper T Cell: 35 % (ref 33–65)
CD4 T Cell Abs: 1014 /uL (ref 400–1790)

## 2019-09-13 NOTE — Progress Notes (Signed)
Patient had no symptoms for syphilis re-infection, however his titer has risen to 1:16; while this is not a 4-fold increase I do worry about early re-infection. Will reach out to see if he would prefer to proceed with treatment with 1 injection of bicillin now or repeat the RPR titer in a few weeks.

## 2019-09-14 LAB — FLUORESCENT TREPONEMAL AB(FTA)-IGG-BLD: Fluorescent Treponemal ABS: REACTIVE — AB

## 2019-09-14 LAB — HIV-1 RNA QUANT-NO REFLEX-BLD
HIV 1 RNA Quant: <20 copies/mL
HIV-1 RNA Quant, Log: <1.3 Log copies/mL

## 2019-09-14 LAB — RPR TITER: RPR Titer: 1:16 {titer} — ABNORMAL HIGH

## 2019-09-14 LAB — RPR: RPR Ser Ql: REACTIVE — AB

## 2019-10-16 ENCOUNTER — Telehealth: Payer: Self-pay

## 2019-10-16 ENCOUNTER — Other Ambulatory Visit: Payer: Self-pay

## 2019-10-16 ENCOUNTER — Other Ambulatory Visit (HOSPITAL_COMMUNITY)
Admission: RE | Admit: 2019-10-16 | Discharge: 2019-10-16 | Disposition: A | Discharge status: Home / Self Care | Payer: Medicaid Other | Source: Ambulatory Visit | Attending: Infectious Diseases | Admitting: Infectious Diseases

## 2019-10-16 ENCOUNTER — Other Ambulatory Visit: Payer: Medicaid Other

## 2019-10-16 DIAGNOSIS — Z113 Encounter for screening for infections with a predominantly sexual mode of transmission: Secondary | ICD-10-CM | POA: Insufficient documentation
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
## 2019-10-17 LAB — URINE CYTOLOGY ANCILLARY ONLY
Chlamydia: NEGATIVE
Comment: NEGATIVE
Comment: NORMAL
Neisseria Gonorrhea: NEGATIVE

## 2019-10-17 LAB — FLUORESCENT TREPONEMAL AB(FTA)-IGG-BLD: Fluorescent Treponemal ABS: REACTIVE — AB

## 2019-10-17 LAB — RPR TITER: RPR Titer: 1:128 {titer} — ABNORMAL HIGH

## 2019-10-17 LAB — RPR: RPR Ser Ql: REACTIVE — AB

## 2019-10-17 NOTE — Progress Notes (Signed)
Please call Gerald Lee to let him know that his syphilis titer now confirms he has been re-infected. He will require an injection of bicillin x 1

## 2019-10-18 ENCOUNTER — Telehealth: Payer: Self-pay

## 2019-10-18 NOTE — Telephone Encounter (Signed)
Called patient with lab results. Confirmed patient with two identifiers before relaying results.  Informed patient that he is indeed reinfected with Syphillis and will need to schedule treatment with office. Patient is scheduled for 2/23 at 2. Understands he must inform recent partners to get tested/treated at local health department. Also to refrain from sexual activity until treated and an additional 10 days . Encouraged condom use for all sexual encounters. Patient verbalized understanding and will call office if he has any questions. Lorenso Courier, [place]

## 2019-10-18 NOTE — Progress Notes (Signed)
Patient contacted regarding results. Will come in office for treatment on 2/23.

## 2019-10-18 NOTE — Telephone Encounter (Signed)
-----   Message from Blanchard Kelch, NP sent at 10/17/2019  1:01 PM EST ----- Please call Donnel to let him know that his syphilis titer now confirms he has been re-infected. He will require an injection of bicillin x 1

## 2019-10-22 ENCOUNTER — Ambulatory Visit (INDEPENDENT_AMBULATORY_CARE_PROVIDER_SITE_OTHER): Payer: Medicaid Other | Admitting: *Deleted

## 2019-10-22 ENCOUNTER — Other Ambulatory Visit: Payer: Self-pay

## 2019-10-22 DIAGNOSIS — A539 Syphilis, unspecified: Secondary | ICD-10-CM

## 2019-10-22 MED ORDER — PENICILLIN G BENZATHINE 1200000 UNIT/2ML IM SUSP
1.2000 10*6.[IU] | Freq: Once | INTRAMUSCULAR | Status: AC
  Administered 2019-10-22: 15:00:00 1.2 10*6.[IU] via INTRAMUSCULAR

## 2019-10-22 NOTE — Progress Notes (Signed)
RN offered condoms, advised patient to remain abstinent for 7-10 days after treatment, and that the health department would be following up with him to confirm treatment. Patient's questions answered to their satisfaction. Gerald Lee M, RN  

## 2019-10-30 ENCOUNTER — Encounter: Payer: Medicaid Other | Admitting: *Deleted

## 2019-10-30 ENCOUNTER — Encounter: Payer: Self-pay | Admitting: Infectious Disease

## 2019-10-30 ENCOUNTER — Other Ambulatory Visit: Payer: Self-pay

## 2019-10-30 ENCOUNTER — Encounter (INDEPENDENT_AMBULATORY_CARE_PROVIDER_SITE_OTHER): Payer: Medicaid Other | Admitting: Infectious Disease

## 2019-10-30 VITALS — BP 111/72 | HR 84 | Temp 98.1°F | Wt 298.1 lb

## 2019-10-30 DIAGNOSIS — Z006 Encounter for examination for normal comparison and control in clinical research program: Secondary | ICD-10-CM

## 2019-10-30 DIAGNOSIS — Z9229 Personal history of other drug therapy: Secondary | ICD-10-CM

## 2019-10-30 DIAGNOSIS — Z21 Asymptomatic human immunodeficiency virus [HIV] infection status: Secondary | ICD-10-CM

## 2019-10-30 NOTE — Addendum Note (Signed)
Addended by: Osvaldo Shipper on: 10/30/2019 12:19 PM   Modules accepted: Orders

## 2019-10-30 NOTE — Research (Signed)
Gerald Lee is here for his SOL[facility] screening visit. He has been on uninterrupted Biktarvy since 04/2017. I explained/reviewed the informed consent with him. Risk, benefits, responsibilities and other options were reviewed. I answered his questions. Comprehension of the study was assessed. He was given adequate time to consider options. He verbalized understanding and signed the consent witnessed by me. Signed consent was obtained prior to any procedures being done. ECG was obtained which showed sinus rhythm. He has a history of passive suicidal thoughts. He states that this was back in 2018 when he was initially diagnosed with HIV. He says that he has not had any suicidal thought in years. He also has a history of anxiety and depression. He states that he has not been taking his Effexor-XR for about a year and states that he feels "good". He is working on getting back into therapy. Offered to schedule him with Marylu Lund but he stated that he is planning to get in with a therapist that his younger sibling is seeing once his "medicare issues" are cleared up. He states that he occasionally has problems with orthostatic hypotension. He says that hydrating well helps with symptoms. Tattoos to bilateral forearm and shoulders. No tattoos to gluteal area. Verbalized excellent adherence with his Biktarvy. VL has been undetectable since November 2018. He is tentatively scheduled for entry 11/14/19 pending confirmation of eligibility.

## 2019-10-30 NOTE — Progress Notes (Signed)
Chief complaint: Here for [facility] visit   Subjective:   Patient ID: Gerald Lee, male    DOB: Feb 19, 1996, 24 y.o.   MRN: [id number]  This is a 24 year old African-American man living with HIV that has been perfectly controlled on [place].  He is here for screening for the [facility] AR visit.  He has no complaints whatsoever and has been highly adherent to his Biktarvy.  Past Medical History:  Diagnosis Date  . Asthma   . Headache(784.0)   . HIV infection ([place])   . Orthostatic hypotension     Past Surgical History:  Procedure Laterality Date  . CIRCUMCISION  1998  . HERNIA REPAIR     Done between the ages of 70 or 8 years   . NO PAST SURGERIES      Family History  Problem Relation Age of Onset  . Stroke Mother   . Hypertension Mother   . Diabetes Father       Social History   Socioeconomic History  . Marital status: Single    Spouse name: Not on file  . Number of children: Not on file  . Years of education: Not on file  . Highest education level: Not on file  Occupational History  . Not on file  Tobacco Use  . Smoking status: Light Tobacco Smoker    Packs/day: 0.10    Types: Cigarettes, Cigars  . Smokeless tobacco: Never Used  . Tobacco comment: "only when super stressed"  Substance and Sexual Activity  . Alcohol use: Yes    Comment: occ; drink daily  . Drug use: Yes    Frequency: 7.0 times per week    Types: Marijuana    Comment: occassionally  . Sexual activity: Yes    Partners: Male    Birth control/protection: Condom    Comment: declined condoms 1/21  Other Topics Concern  . Not on file  Social History Narrative  . Not on file   Social Determinants of Health   Financial Resource Strain:   . Difficulty of Paying Living Expenses: Not on file  Food Insecurity:   . Worried About Charity fundraiser in the Last Year: Not on file  . Ran Out of Food in the Last Year: Not on file  Transportation Needs:   . Lack of Transportation (Medical): Not on  file  . Lack of Transportation (Non-Medical): Not on file  Physical Activity:   . Days of Exercise per Week: Not on file  . Minutes of Exercise per Session: Not on file  Stress:   . Feeling of Stress : Not on file  Social Connections:   . Frequency of Communication with Friends and Family: Not on file  . Frequency of Social Gatherings with Friends and Family: Not on file  . Attends Religious Services: Not on file  . Active Member of Clubs or Organizations: Not on file  . Attends Archivist Meetings: Not on file  . Marital Status: Not on file    Allergies  Allergen Reactions  . Ibuprofen Other (See Comments)    Per patient "can not take because it will shut down my kidneys"     Current Outpatient Medications:  .  bacitracin ointment, Apply 1 application topically 2 (two) times daily., Disp: 120 g, Rfl: 0 .  bictegravir-emtricitabine-tenofovir AF (BIKTARVY) 50-200-25 MG TABS tablet, Take 1 tablet by mouth daily., Disp: 30 tablet, Rfl: 11 .  fluticasone (FLONASE) 50 MCG/ACT nasal spray, Place 1 spray into both  nostrils daily. (Patient not taking: Reported on 09/10/2019), Disp: 16 g, Rfl: 2 .  hydrOXYzine (ATARAX/VISTARIL) 25 MG tablet, Take 1 tablet (25 mg total) by mouth 3 (three) times daily as needed for anxiety., Disp: 30 tablet, Rfl: 0 .  ibuprofen (ADVIL) 800 MG tablet, Take 800 mg by mouth every 6 (six) hours as needed., Disp: , Rfl:  .  ondansetron (ZOFRAN) 4 MG tablet, Take 1 tablet (4 mg total) by mouth every 12 (twelve) hours as needed (30 minutes before antibiotic if needed to prevent nausea)., Disp: 40 tablet, Rfl: 0 .  orphenadrine (NORFLEX) 100 MG tablet, Take 100 mg by mouth 2 (two) times daily as needed., Disp: , Rfl:  .  traZODone (DESYREL) 50 MG tablet, Take 1-2 tablets (50-100 mg total) by mouth at bedtime as needed for sleep., Disp: 40 tablet, Rfl: 2 .  venlafaxine XR (EFFEXOR-XR) 75 MG 24 hr capsule, TAKE ONE CAPSULE BY MOUTH DAILY WITH BREAKFAST FOR MOOD  CONTROL, Disp: 30 capsule, Rfl: 2   Review of Systems  Constitutional: Negative for activity change, appetite change, chills, diaphoresis, fatigue, fever and unexpected weight change.  HENT: Negative for congestion, rhinorrhea, sinus pressure, sneezing, sore throat and trouble swallowing.   Eyes: Negative for photophobia and visual disturbance.  Respiratory: Negative for cough, chest tightness, shortness of breath, wheezing and stridor.   Cardiovascular: Negative for chest pain, palpitations and leg swelling.  Gastrointestinal: Negative for abdominal distention, abdominal pain, anal bleeding, blood in stool, constipation, diarrhea, nausea and vomiting.  Genitourinary: Negative for difficulty urinating, dysuria, flank pain and hematuria.  Musculoskeletal: Negative for arthralgias, back pain, gait problem, joint swelling and myalgias.  Skin: Negative for color change, pallor, rash and wound.  Neurological: Negative for dizziness, tremors, weakness, light-headedness and headaches.  Hematological: Negative for adenopathy. Does not bruise/bleed easily.  Psychiatric/Behavioral: Negative for agitation, behavioral problems, confusion, decreased concentration, dysphoric mood, sleep disturbance and suicidal ideas. The patient is not nervous/anxious and is not hyperactive.        Objective:   Physical Exam Constitutional:      General: He is not in acute distress.    Appearance: He is not diaphoretic.  HENT:     Head: Normocephalic and atraumatic.     Right Ear: External ear normal.     Left Ear: External ear normal.     Nose: Nose normal. No congestion or rhinorrhea.     Mouth/Throat:     Mouth: Mucous membranes are moist.     Pharynx: Oropharynx is clear. No oropharyngeal exudate.  Eyes:     General: No scleral icterus.    Extraocular Movements: Extraocular movements intact.     Conjunctiva/sclera: Conjunctivae normal.  Cardiovascular:     Rate and Rhythm: Normal rate and regular rhythm.       Heart sounds: Normal heart sounds. No murmur. No friction rub. No gallop.   Pulmonary:     Effort: Pulmonary effort is normal. No respiratory distress.     Breath sounds: Normal breath sounds. No wheezing or rales.  Abdominal:     General: Bowel sounds are normal. There is no distension.     Palpations: Abdomen is soft.     Tenderness: There is no abdominal tenderness. There is no rebound.  Musculoskeletal:        General: No tenderness. Normal range of motion.     Cervical back: Normal range of motion and neck supple.  Lymphadenopathy:     Cervical: No cervical adenopathy.  Skin:  General: Skin is warm and dry.     Coloration: Skin is not pale.     Findings: No erythema or rash.  Neurological:     General: No focal deficit present.     Mental Status: He is alert. Mental status is at baseline. He is disoriented.     Cranial Nerves: No cranial nerve deficit.     Sensory: No sensory deficit.     Motor: No weakness.     Coordination: Coordination normal.  Psychiatric:        Mood and Affect: Mood normal.        Behavior: Behavior normal.        Thought Content: Thought content normal.        Judgment: Judgment normal.   Tattoos present        Assessment & Plan:  HIV: Perfectly controlled on Biktarvy.  EKG was normal sinus rhythm without any abnormalities  He should be ready to enroll into [facility]

## 2019-10-31 LAB — PROTIME-INR
INR: 1
Prothrombin Time: 10.8 s (ref 9.0–11.5)

## 2019-10-31 LAB — APTT: aPTT: 27 s (ref 23–32)

## 2019-11-13 ENCOUNTER — Encounter: Payer: Self-pay | Admitting: Infectious Disease

## 2019-11-13 LAB — CD4/CD8 (T-HELPER/T-SUPPRESSOR CELL)
CD4 Count: 886
CD4%: 33
HIV 1 RNA Quant: <50

## 2019-11-20 ENCOUNTER — Other Ambulatory Visit: Payer: Self-pay

## 2019-11-20 ENCOUNTER — Encounter: Payer: Self-pay | Admitting: Infectious Diseases

## 2019-11-20 ENCOUNTER — Encounter (INDEPENDENT_AMBULATORY_CARE_PROVIDER_SITE_OTHER): Payer: Self-pay | Admitting: *Deleted

## 2019-11-20 VITALS — BP 114/74 | HR 64 | Temp 97.8°F | Wt 295.5 lb

## 2019-11-20 DIAGNOSIS — Z006 Encounter for examination for normal comparison and control in clinical research program: Secondary | ICD-10-CM

## 2019-11-20 NOTE — Research (Signed)
Gerald Lee is here for his SOLAR entry visit. After verifying eligibility he was randomized to the Biktarvy arm.  He understands that he will be eligible to move to the injectables at the end of the maintenance phase in 12 months if his VL remains undetectable. He will continue to take his Biktarvy in the morning as he was prior to study entry. He will return for his next study visit in May.

## 2019-11-21 ENCOUNTER — Encounter: Payer: Self-pay | Admitting: Infectious Disease

## 2019-12-13 ENCOUNTER — Encounter: Payer: Self-pay | Admitting: Infectious Disease

## 2019-12-13 LAB — CD4/CD8 (T-HELPER/T-SUPPRESSOR CELL)
CD4 % Helper T Cell: 31
CD4 Count: 827
CD8 % Suppressor T Cell: 37
CD8 T Cell Abs: 992
HIV 1 RNA UltraQuant: <50

## 2019-12-21 ENCOUNTER — Other Ambulatory Visit: Payer: Self-pay

## 2019-12-21 ENCOUNTER — Encounter (HOSPITAL_COMMUNITY): Payer: Self-pay | Admitting: Emergency Medicine

## 2019-12-21 ENCOUNTER — Emergency Department (HOSPITAL_COMMUNITY)
Admission: EM | Admit: 2019-12-21 | Discharge: 2019-12-21 | Disposition: A | Discharge status: Home / Self Care | Payer: Medicaid Other | Attending: Emergency Medicine | Admitting: Emergency Medicine

## 2019-12-21 DIAGNOSIS — Z79899 Other long term (current) drug therapy: Secondary | ICD-10-CM | POA: Insufficient documentation

## 2019-12-21 DIAGNOSIS — J45909 Unspecified asthma, uncomplicated: Secondary | ICD-10-CM | POA: Insufficient documentation

## 2019-12-21 DIAGNOSIS — J029 Acute pharyngitis, unspecified: Secondary | ICD-10-CM | POA: Diagnosis present

## 2019-12-21 DIAGNOSIS — Z720 Tobacco use: Secondary | ICD-10-CM | POA: Diagnosis not present

## 2019-12-21 DIAGNOSIS — B20 Human immunodeficiency virus [HIV] disease: Secondary | ICD-10-CM | POA: Insufficient documentation

## 2019-12-21 MED ORDER — PENICILLIN G BENZATHINE 1200000 UNIT/2ML IM SUSP
1.2000 10*6.[IU] | Freq: Once | INTRAMUSCULAR | Status: AC
  Administered 2019-12-21: 1.2 10*6.[IU] via INTRAMUSCULAR
  Filled 2019-12-21: qty 2

## 2019-12-21 MED ORDER — KETOROLAC TROMETHAMINE 15 MG/ML IJ SOLN
15.0000 mg | Freq: Once | INTRAMUSCULAR | Status: AC
  Administered 2019-12-21: 17:00:00 15 mg via INTRAMUSCULAR
  Filled 2019-12-21: qty 1

## 2019-12-21 NOTE — ED Provider Notes (Signed)
[facility] EMERGENCY DEPARTMENT Provider Note   CSN: [id number] Arrival date & time: 12/21/19  1516     History Chief Complaint  Patient presents with  . Sore Throat    Gerald Lee is a 24 y.o. male with history significant for asthma, HIV infection, last levels undetectable, chronic headache, syphilis who presents for evaluation of sore throat.  Patient developed sore throat 2 days ago.  States similar to prior episodes of strep pharyngitis.  Has been tolerating p.o. intake without difficulty.  No unilateral neck pain, neck stiffness or neck rigidity.  Thought this might be seasonal allergies and was taking OTC allergy medication which has not helped.  Rates his pain a 6/10.  Denies any drooling, dysphagia or trismus.  No headache, lightheadedness, dizziness, chest pain, shortness of breath, cough, recent Covid exposures, weakness.  Denies additional aggravating or alleviating factors.  Denies recent sexual encounters with concerns for oropharyngeal STDs.  History obtained from patient and past medical records.  No interpreter is used.  HPI     Past Medical History:  Diagnosis Date  . Asthma   . Headache(784.0)   . HIV infection ([place])   . Orthostatic hypotension     Patient Active Problem List   Diagnosis Date Noted  . Weight loss counseling, encounter for 09/10/2019  . Muscle cramps 03/25/2019  . 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 History of syphilis 07/11/2017  . Moderate episode of recurrent major depressive disorder ([place]) 06/21/2017  . HIV (human immunodeficiency virus infection) ([place]) 05/22/2017  . Healthcare maintenance 05/22/2017  . Migraine without aura 03/14/2014  . Obesity, unspecified 03/14/2014  . Insomnia, unspecified 03/14/2014    Past Surgical History:  Procedure Laterality Date  . CIRCUMCISION  1998  . HERNIA REPAIR     Done between the ages of 62 or 40 years   . NO PAST SURGERIES         Family History   Problem Relation Age of Onset  . Stroke Mother   . Hypertension Mother   . Diabetes Father     Social History   Tobacco Use  . Smoking status: Light Tobacco Smoker    Packs/day: 0.10    Types: Cigarettes, Cigars  . Smokeless tobacco: Never Used  . Tobacco comment: "only when super stressed"  Substance Use Topics  . Alcohol use: Yes    Comment: occ; drink daily  . Drug use: Yes    Frequency: 7.0 times per week    Types: Marijuana    Comment: occassionally    Home Medications Prior to Admission medications   Medication Sig Start Date End Date Taking? Authorizing Provider  bacitracin ointment Apply 1 application topically 2 (two) times daily. 04/05/19   Law, Bea Graff, PA-C  bictegravir-emtricitabine-tenofovir AF (BIKTARVY) 50-200-25 MG TABS tablet Take 1 tablet by mouth daily. 09/10/19   Walters Callas, NP  fluticasone (FLONASE) 50 MCG/ACT nasal spray Place 1 spray into both nostrils daily. Patient not taking: Reported on 09/10/2019 04/18/19   [place]idence Lanius A, PA-C  hydrOXYzine (ATARAX/VISTARIL) 25 MG tablet Take 1 tablet (25 mg total) by mouth 3 (three) times daily as needed for anxiety. 06/27/17   Money, Lowry Ram, FNP  ibuprofen (ADVIL) 800 MG tablet Take 800 mg by mouth every 6 (six) hours as needed. 06/28/19   [provider]  ondansetron (ZOFRAN) 4 MG tablet Take 1 tablet (4 mg total) by mouth every 12 (twelve) hours as needed (30 minutes before antibiotic if needed  to prevent nausea). 08/25/17   Blanchard Kelch, NP  orphenadrine (NORFLEX) 100 MG tablet Take 100 mg by mouth 2 (two) times daily as needed. 06/28/19   [provider]  traZODone (DESYREL) 50 MG tablet Take 1-2 tablets (50-100 mg total) by mouth at bedtime as needed for sleep. 02/27/18   Blanchard Kelch, NP  venlafaxine XR (EFFEXOR-XR) 75 MG 24 hr capsule TAKE ONE CAPSULE BY MOUTH DAILY WITH BREAKFAST FOR MOOD CONTROL 02/22/19   Blanchard Kelch, NP    Allergies    Ibuprofen  Review  of Systems   Review of Systems  Constitutional: Negative.   HENT: Positive for sore throat. Negative for congestion, dental problem, drooling, ear discharge, ear pain, facial swelling, hearing loss, mouth sores, nosebleeds, postnasal drip, rhinorrhea, sinus pressure, sinus pain, sneezing, trouble swallowing and voice change.   Eyes: Negative.   Respiratory: Negative.   Cardiovascular: Negative.   Gastrointestinal: Negative.   Genitourinary: Negative.   Musculoskeletal: Negative.   Skin: Negative.   Neurological: Negative.   All other systems reviewed and are negative.   Physical Exam Updated Vital Signs BP 121/70 (BP Location: Right Arm)   Pulse 92   Temp 99.3 F (37.4 C) (Oral)   Resp 18   SpO2 95%   Physical Exam Vitals and nursing note reviewed.  Constitutional:      General: He is not in acute distress.    Appearance: He is well-developed. He is not ill-appearing, toxic-appearing or diaphoretic.  HENT:     Head: Normocephalic and atraumatic.     Jaw: There is normal jaw occlusion.     Right Ear: Tympanic membrane and ear canal normal. No drainage, swelling or tenderness. No middle ear effusion. Tympanic membrane is not erythematous.     Left Ear: Tympanic membrane and ear canal normal. No drainage, swelling or tenderness.  No middle ear effusion. Tympanic membrane is not erythematous.     Nose: No congestion or rhinorrhea.     Mouth/Throat:     Lips: Pink.     Mouth: Mucous membranes are moist.     Tongue: No lesions. Tongue does not deviate from midline.     Palate: No mass and lesions.     Pharynx: Oropharynx is clear. Uvula midline. No oropharyngeal exudate or uvula swelling.     Tonsils: Tonsillar exudate present. No tonsillar abscesses. 2+ on the right. 2+ on the left.     Comments: Posterior oropharynx with 2+ edema with exudate.  Sublingual area soft.  No drooling, dysphagia or trismus.  No evidence of PTA or RPA.  Uvula midline.  Posterior oropharyngeal  erythema. Eyes:     Pupils: Pupils are equal, round, and reactive to light.  Neck:     Trachea: Trachea and phonation normal.     Comments: No neck stiffness or neck rigidity.  Cervical lymphadenopathy present Cardiovascular:     Rate and Rhythm: Normal rate and regular rhythm.     Pulses: Normal pulses.     Heart sounds: Normal heart sounds.  Pulmonary:     Effort: Pulmonary effort is normal. No respiratory distress.     Breath sounds: Normal breath sounds and air entry.  Abdominal:     General: There is no distension.     Palpations: Abdomen is soft.  Musculoskeletal:        General: Normal range of motion.     Cervical back: Normal range of motion and neck supple.  Lymphadenopathy:     Cervical:  Cervical adenopathy present.  Skin:    General: Skin is warm and dry.  Neurological:     Mental Status: He is alert.    ED Results / Procedures / Treatments   Labs (all labs ordered are listed, but only abnormal results are displayed) Labs Reviewed - No data to display  EKG None  Radiology No results found.  Procedures Procedures (including critical care time)  Medications Ordered in ED Medications  penicillin g benzathine (BICILLIN LA) 1200000 UNIT/2ML injection 1.2 Million Units (has no administration in time range)  ketorolac (TORADOL) 15 MG/ML injection 15 mg (has no administration in time range)    ED Course  I have reviewed the triage vital signs and the nursing notes.  Pertinent labs & imaging results that were available during my care of the patient were reviewed by me and considered in my medical decision making (see chart for details).  24 year old presents for evaluation of sore throat.  He is afebrile, nonseptic, not ill-appearing.  Does have history of HIV however has "excellent control with undetectable levels" per last ID note. CD4 886 1 month ago.  He has 2+ tonsils bilaterally with erythema and exudate.  Uvula midline.  No phonation changes.  No  drooling, dysphagia or trismus.  He does have cervical lymphadenopathy.  Low suspicion for PTA, RPA, deep space infection.  Exam consistent with strep pharyngitis.  Patient prefers to not have any swabs at this time occluding strep, oral gonorrhea/chlamydia.Marland Kitchen  He also does have history of syphilis however denies any concerns for reoccurrence of his syphilis, rashes, lesions, joint pain, penile discharge.  Centor criteria 3, given he does not want testing will treat for strep pharyngitis.  Pt with tonsillar exudate, cervical lymphadenopathy, & dysphagia; diagnosis of bacterial pharyngitis. Treated in the ED with NSAIDs, and PCN IM.  Specific return precautions discussed. Pt able to drink water in ED without difficulty with intact air way. Recommended PCP/ ID follow up  The patient has been appropriately medically screened and/or stabilized in the ED. I have low suspicion for any other emergent medical condition which would require further screening, evaluation or treatment in the ED or require inpatient management.  Patient is hemodynamically stable and in no acute distress.  Patient able to ambulate in department prior to ED.  Evaluation does not show acute pathology that would require ongoing or additional emergent interventions while in the emergency department or further inpatient treatment.  I have discussed the diagnosis with the patient and answered all questions.  Pain is been managed while in the emergency department and patient has no further complaints prior to discharge.  Patient is comfortable with plan discussed in room and is stable for discharge at this time.  I have discussed strict return precautions for returning to the emergency department.  Patient was encouraged to follow-up with PCP/specialist refer to at discharge.    MDM Rules/Calculators/A&P                       Final Clinical Impression(s) / ED Diagnoses Final diagnoses:  Pharyngitis, unspecified etiology    Rx / DC  Orders ED Discharge Orders    None       Theotis Gerdeman A, PA-C 12/21/19 1726    Blane Ohara, MD 12/22/19 1511

## 2019-12-21 NOTE — ED Triage Notes (Signed)
C/o sore throat since Friday morning.  Also reports generalized body aches but thinks it may be related to not stretching prior to dancing.  Denies fever and chills.

## 2019-12-21 NOTE — Discharge Instructions (Signed)
Treated here today for strep.  If your symptoms do not improve you need to seek reevaluation.

## 2020-01-06 DIAGNOSIS — S36409A Unspecified injury of unspecified part of small intestine, initial encounter: Secondary | ICD-10-CM | POA: Insufficient documentation

## 2020-01-06 MED ORDER — SODIUM CHLORIDE FLUSH 0.9 % IV SOLN
5.00 | INTRAVENOUS | Status: DC
Start: 2020-01-23 — End: 2020-01-06

## 2020-01-06 MED ORDER — GENERIC EXTERNAL MEDICATION
Status: DC
Start: ? — End: 2020-01-06

## 2020-01-06 MED ORDER — GENERIC EXTERNAL MEDICATION
1.00 | Status: DC
Start: 2020-01-08 — End: 2020-01-06

## 2020-01-06 MED ORDER — BACITRACIN-NEOMYCIN-POLYMYXIN 400-5-5000 EX OINT
TOPICAL_OINTMENT | CUTANEOUS | Status: DC
Start: 2020-01-08 — End: 2020-01-06

## 2020-01-06 MED ORDER — LIDOCAINE HCL 1 % IJ SOLN
0.50 | INTRAMUSCULAR | Status: DC
Start: ? — End: 2020-01-06

## 2020-01-06 MED ORDER — LIDOCAINE HCL 1 % IJ SOLN
3.00 | INTRAMUSCULAR | Status: DC
Start: ? — End: 2020-01-06

## 2020-01-06 MED ORDER — PROPOFOL 100 MG/10ML IV EMUL
5.00 | INTRAVENOUS | Status: DC
Start: ? — End: 2020-01-06

## 2020-01-06 MED ORDER — GENERIC EXTERNAL MEDICATION
40.00 | Status: DC
Start: 2020-01-09 — End: 2020-01-06

## 2020-01-06 MED ORDER — CHLORHEXIDINE GLUCONATE 0.12 % MT SOLN
5.00 | OROMUCOSAL | Status: DC
Start: 2020-01-08 — End: 2020-01-06

## 2020-01-06 MED ORDER — LACTATED RINGERS IV SOLN
INTRAVENOUS | Status: DC
Start: ? — End: 2020-01-06

## 2020-01-06 MED ORDER — GENERIC EXTERNAL MEDICATION
3.38 | Status: DC
Start: 2020-01-08 — End: 2020-01-06

## 2020-01-06 MED ORDER — DEXMEDETOMIDINE HCL IN NACL 400 MCG/100ML IV SOLN
0.20 | INTRAVENOUS | Status: DC
Start: ? — End: 2020-01-06

## 2020-01-08 MED ORDER — DEXTROSE 10 % IV SOLN
INTRAVENOUS | Status: DC
Start: ? — End: 2020-01-08

## 2020-01-08 MED ORDER — GENERIC EXTERNAL MEDICATION
Status: DC
Start: ? — End: 2020-01-08

## 2020-01-08 MED ORDER — SODIUM CHLORIDE 0.9 % IV SOLN
INTRAVENOUS | Status: DC
Start: ? — End: 2020-01-08

## 2020-01-13 MED ORDER — HEPARIN SODIUM (PORCINE) 5000 UNIT/ML IJ SOLN
5000.00 | INTRAMUSCULAR | Status: DC
Start: 2020-01-24 — End: 2020-01-13

## 2020-01-13 MED ORDER — TRAZODONE HCL 50 MG PO TABS
50.00 | ORAL_TABLET | ORAL | Status: DC
Start: 2020-01-23 — End: 2020-01-13

## 2020-01-13 MED ORDER — GENERIC EXTERNAL MEDICATION
Status: DC
Start: ? — End: 2020-01-13

## 2020-01-13 MED ORDER — METOCLOPRAMIDE HCL 5 MG/ML IJ SOLN
10.00 | INTRAMUSCULAR | Status: DC
Start: 2020-01-24 — End: 2020-01-13

## 2020-01-13 MED ORDER — LOPERAMIDE HCL 2 MG PO CAPS
2.00 | ORAL_CAPSULE | ORAL | Status: DC
Start: 2020-01-13 — End: 2020-01-13

## 2020-01-13 MED ORDER — DEXTROSE 10 % IV SOLN
50.00 | INTRAVENOUS | Status: DC
Start: ? — End: 2020-01-13

## 2020-01-13 MED ORDER — VENLAFAXINE HCL 37.5 MG PO TABS
37.50 | ORAL_TABLET | ORAL | Status: DC
Start: 2020-01-13 — End: 2020-01-13

## 2020-01-13 MED ORDER — MELATONIN 3 MG PO TBDP
3.00 | ORAL_TABLET | ORAL | Status: DC
Start: 2020-01-15 — End: 2020-01-13

## 2020-01-13 MED ORDER — GENERIC EXTERNAL MEDICATION
8.00 | Status: DC
Start: ? — End: 2020-01-13

## 2020-01-13 MED ORDER — LIDOCAINE HCL 1 % IJ SOLN
0.50 | INTRAMUSCULAR | Status: DC
Start: ? — End: 2020-01-13

## 2020-01-13 MED ORDER — GENERIC EXTERNAL MEDICATION
0.25 | Status: DC
Start: ? — End: 2020-01-13

## 2020-01-13 MED ORDER — LIDOCAINE 5 % EX PTCH
2.00 | MEDICATED_PATCH | CUTANEOUS | Status: DC
Start: 2020-01-15 — End: 2020-01-13

## 2020-01-13 MED ORDER — ACETAMINOPHEN 160 MG/5ML PO SUSP
975.00 | ORAL | Status: DC
Start: 2020-01-13 — End: 2020-01-13

## 2020-01-14 MED ORDER — GENERIC EXTERNAL MEDICATION
Status: DC
Start: ? — End: 2020-01-14

## 2020-01-14 MED ORDER — BICTEGRAVIR-EMTRICITAB-TENOFOV 50-200-25 MG PO TABS
1.00 | ORAL_TABLET | ORAL | Status: DC
Start: 2020-01-15 — End: 2020-01-14

## 2020-01-14 MED ORDER — METHOCARBAMOL 500 MG PO TABS
500.00 | ORAL_TABLET | ORAL | Status: DC
Start: ? — End: 2020-01-14

## 2020-01-14 MED ORDER — VENLAFAXINE HCL 37.5 MG PO TABS
37.50 | ORAL_TABLET | ORAL | Status: DC
Start: 2020-01-15 — End: 2020-01-14

## 2020-01-14 MED ORDER — LOPERAMIDE HCL 2 MG PO CAPS
4.00 | ORAL_CAPSULE | ORAL | Status: DC
Start: 2020-01-15 — End: 2020-01-14

## 2020-01-14 MED ORDER — ACETAMINOPHEN 325 MG PO TABS
975.00 | ORAL_TABLET | ORAL | Status: DC
Start: 2020-01-24 — End: 2020-01-14

## 2020-01-21 MED ORDER — FLUCONAZOLE IN DEXTROSE 200 MG/100ML IV SOLN
200.00 | INTRAVENOUS | Status: DC
Start: 2020-01-21 — End: 2020-01-21

## 2020-01-21 MED ORDER — GENERIC EXTERNAL MEDICATION
Status: DC
Start: ? — End: 2020-01-21

## 2020-01-21 MED ORDER — GENERIC EXTERNAL MEDICATION
3.38 | Status: DC
Start: 2020-01-21 — End: 2020-01-21

## 2020-01-21 MED ORDER — MELATONIN 3 MG PO TBDP
6.00 | ORAL_TABLET | ORAL | Status: DC
Start: 2020-01-23 — End: 2020-01-21

## 2020-01-23 MED ORDER — GENERIC EXTERNAL MEDICATION
Status: DC
Start: 2020-01-23 — End: 2020-01-23

## 2020-01-28 MED ORDER — OXYCODONE HCL 5 MG PO TABS
10.00 | ORAL_TABLET | ORAL | Status: DC
Start: ? — End: 2020-01-28

## 2020-01-28 MED ORDER — ACETAMINOPHEN 325 MG PO TABS
650.00 | ORAL_TABLET | ORAL | Status: DC
Start: ? — End: 2020-01-28

## 2020-01-28 MED ORDER — ENOXAPARIN SODIUM 30 MG/0.3ML ~~LOC~~ SOLN
30.00 | SUBCUTANEOUS | Status: DC
Start: 2020-01-30 — End: 2020-01-28

## 2020-01-28 MED ORDER — HYDROMORPHONE HCL 1 MG/ML IJ SOLN
0.50 | INTRAMUSCULAR | Status: DC
Start: ? — End: 2020-01-28

## 2020-01-30 ENCOUNTER — Ambulatory Visit: Payer: Medicaid Other | Admitting: Infectious Diseases

## 2020-01-30 MED ORDER — HYDROMORPHONE HCL 1 MG/ML IJ SOLN
0.50 | INTRAMUSCULAR | Status: DC
Start: ? — End: 2020-01-30

## 2020-01-30 MED ORDER — OXYCODONE HCL 5 MG PO TABS
10.00 | ORAL_TABLET | ORAL | Status: DC
Start: ? — End: 2020-01-30

## 2020-02-03 MED ORDER — SIMETHICONE 80 MG PO CHEW
80.00 | CHEWABLE_TABLET | ORAL | Status: DC
Start: ? — End: 2020-02-03

## 2020-02-03 MED ORDER — SALINE NASAL SPRAY 0.65 % NA SOLN
1.00 | NASAL | Status: DC
Start: ? — End: 2020-02-03

## 2020-02-03 MED ORDER — LORATADINE 10 MG PO TABS
10.00 | ORAL_TABLET | ORAL | Status: DC
Start: 2020-02-04 — End: 2020-02-03

## 2020-02-05 ENCOUNTER — Telehealth: Payer: Self-pay | Admitting: Pharmacy Technician

## 2020-02-05 NOTE — Telephone Encounter (Signed)
[facility] Patient Advocate Encounter  Fax notification received from [facility] Advancing [facility] that the patient has been removed from their program effective 12/14/2019. He has had active [employer] since May 2020 and enrolled in the Advancing Access program in July. [phone] for any questions.  Tested in the pharmacy system and patient is still actively [employer].

## 2020-02-23 ENCOUNTER — Emergency Department (HOSPITAL_COMMUNITY)
Admission: EM | Admit: 2020-02-23 | Discharge: 2020-02-23 | Disposition: A | Discharge status: Home / Self Care | Payer: Medicaid Other | Attending: Emergency Medicine | Admitting: Emergency Medicine

## 2020-02-23 ENCOUNTER — Encounter (HOSPITAL_COMMUNITY): Payer: Self-pay | Admitting: *Deleted

## 2020-02-23 ENCOUNTER — Other Ambulatory Visit: Payer: Self-pay

## 2020-02-23 DIAGNOSIS — F17210 Nicotine dependence, cigarettes, uncomplicated: Secondary | ICD-10-CM | POA: Diagnosis not present

## 2020-02-23 DIAGNOSIS — Z432 Encounter for attention to ileostomy: Secondary | ICD-10-CM | POA: Diagnosis not present

## 2020-02-23 DIAGNOSIS — R109 Unspecified abdominal pain: Secondary | ICD-10-CM | POA: Diagnosis not present

## 2020-02-23 DIAGNOSIS — Z79899 Other long term (current) drug therapy: Secondary | ICD-10-CM | POA: Diagnosis not present

## 2020-02-23 MED ORDER — OXYCODONE-ACETAMINOPHEN 5-325 MG PO TABS
1.0000 | ORAL_TABLET | Freq: Once | ORAL | Status: AC
  Administered 2020-02-23: 1 via ORAL
  Filled 2020-02-23: qty 1

## 2020-02-23 NOTE — Discharge Instructions (Signed)
Please follow up with [facility] tomorrow and request help with more teaching for ostomy bag change Return if you are worsening

## 2020-02-23 NOTE — ED Notes (Signed)
Patient verbalizes understanding of discharge instructions . Opportunity for questions and answers were provided . Armband removed by staff ,Pt discharged from ED. W/C  offered at D/C  and Declined W/C at D/C and was escorted to lobby by RN.  
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
## 2020-02-23 NOTE — ED Notes (Signed)
Pt ambulated in hall with walker
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
## 2020-02-25 ENCOUNTER — Encounter (HOSPITAL_COMMUNITY): Payer: Self-pay

## 2020-02-25 ENCOUNTER — Emergency Department (HOSPITAL_COMMUNITY)
Admission: EM | Admit: 2020-02-25 | Discharge: 2020-02-25 | Disposition: A | Discharge status: Home / Self Care | Payer: Medicaid Other | Attending: Emergency Medicine | Admitting: Emergency Medicine

## 2020-02-25 ENCOUNTER — Other Ambulatory Visit: Payer: Self-pay

## 2020-02-25 DIAGNOSIS — K9403 Colostomy malfunction: Secondary | ICD-10-CM | POA: Insufficient documentation

## 2020-02-25 DIAGNOSIS — Z5321 Procedure and treatment not carried out due to patient leaving prior to being seen by health care provider: Secondary | ICD-10-CM | POA: Diagnosis not present
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
## 2020-02-26 ENCOUNTER — Emergency Department (HOSPITAL_COMMUNITY)
Admission: EM | Admit: 2020-02-26 | Discharge: 2020-02-26 | Disposition: A | Discharge status: Home / Self Care | Payer: Medicaid Other | Attending: Emergency Medicine | Admitting: Emergency Medicine

## 2020-02-26 ENCOUNTER — Encounter (HOSPITAL_COMMUNITY): Payer: Self-pay | Admitting: Emergency Medicine

## 2020-02-26 DIAGNOSIS — J45909 Unspecified asthma, uncomplicated: Secondary | ICD-10-CM | POA: Diagnosis not present

## 2020-02-26 DIAGNOSIS — F17210 Nicotine dependence, cigarettes, uncomplicated: Secondary | ICD-10-CM | POA: Diagnosis not present

## 2020-02-26 DIAGNOSIS — Z7189 Other specified counseling: Secondary | ICD-10-CM

## 2020-02-26 DIAGNOSIS — Z433 Encounter for attention to colostomy: Secondary | ICD-10-CM | POA: Diagnosis not present

## 2020-02-26 NOTE — ED Triage Notes (Signed)
Patient states he came here for an ostomoy/ wound care consult. States ostomy is still leaking. He came the other day but the ostomy nurse was gone already.

## 2020-02-26 NOTE — ED Notes (Signed)
An After Visit Summary was printed and given to the patient. Discharge instructions given and no further questions at this time.  
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
## 2020-02-26 NOTE — Discharge Instructions (Signed)
Follow up with your primary care provider.
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
## 2020-02-26 NOTE — ED Notes (Signed)
Per Wound Care Consult- will be here in ED shortly to see patient.

## 2020-02-27 DIAGNOSIS — Z419 Encounter for procedure for purposes other than remedying health state, unspecified: Secondary | ICD-10-CM | POA: Diagnosis not present

## 2020-03-03 DIAGNOSIS — Z0000 Encounter for general adult medical examination without abnormal findings: Secondary | ICD-10-CM | POA: Diagnosis not present

## 2020-03-03 DIAGNOSIS — Z131 Encounter for screening for diabetes mellitus: Secondary | ICD-10-CM | POA: Diagnosis not present

## 2020-03-03 DIAGNOSIS — J302 Other seasonal allergic rhinitis: Secondary | ICD-10-CM | POA: Diagnosis not present

## 2020-03-03 DIAGNOSIS — J4520 Mild intermittent asthma, uncomplicated: Secondary | ICD-10-CM | POA: Diagnosis not present

## 2020-03-03 DIAGNOSIS — Z13220 Encounter for screening for lipoid disorders: Secondary | ICD-10-CM | POA: Diagnosis not present

## 2020-03-03 DIAGNOSIS — E559 Vitamin D deficiency, unspecified: Secondary | ICD-10-CM | POA: Diagnosis not present

## 2020-03-03 DIAGNOSIS — Z932 Ileostomy status: Secondary | ICD-10-CM | POA: Diagnosis not present

## 2020-03-03 DIAGNOSIS — S31102D Unspecified open wound of abdominal wall, epigastric region without penetration into peritoneal cavity, subsequent encounter: Secondary | ICD-10-CM | POA: Diagnosis not present

## 2020-03-03 DIAGNOSIS — M545 Low back pain: Secondary | ICD-10-CM | POA: Diagnosis not present

## 2020-03-05 DIAGNOSIS — Z932 Ileostomy status: Secondary | ICD-10-CM | POA: Diagnosis not present

## 2020-03-09 ENCOUNTER — Other Ambulatory Visit: Payer: Self-pay

## 2020-03-09 ENCOUNTER — Encounter: Payer: Self-pay | Admitting: *Deleted

## 2020-03-09 VITALS — BP 114/73 | HR 80 | Temp 97.6°F | Wt 283.2 lb

## 2020-03-09 DIAGNOSIS — Z006 Encounter for examination for normal comparison and control in clinical research program: Secondary | ICD-10-CM

## 2020-03-09 NOTE — Research (Signed)
Entered on wrong patient, please delete
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
## 2020-03-12 ENCOUNTER — Encounter: Payer: Self-pay | Admitting: Infectious Disease

## 2020-03-12 LAB — CD4/CD8 (T-HELPER/T-SUPPRESSOR CELL)
CD4 Count: 1002
CD4%: 33

## 2020-03-12 LAB — HIV RNA, QUANTITATIVE, PCR: HIV 1 RNA Quant: <50

## 2020-03-17 DIAGNOSIS — Z932 Ileostomy status: Secondary | ICD-10-CM | POA: Diagnosis not present

## 2020-03-17 DIAGNOSIS — J302 Other seasonal allergic rhinitis: Secondary | ICD-10-CM | POA: Diagnosis not present

## 2020-03-17 DIAGNOSIS — J4520 Mild intermittent asthma, uncomplicated: Secondary | ICD-10-CM | POA: Diagnosis not present

## 2020-03-17 DIAGNOSIS — J069 Acute upper respiratory infection, unspecified: Secondary | ICD-10-CM | POA: Diagnosis not present

## 2020-03-25 ENCOUNTER — Ambulatory Visit: Payer: Medicaid Other

## 2020-03-29 DIAGNOSIS — Z419 Encounter for procedure for purposes other than remedying health state, unspecified: Secondary | ICD-10-CM | POA: Diagnosis not present

## 2020-03-30 ENCOUNTER — Encounter: Payer: Self-pay | Admitting: Infectious Diseases

## 2020-03-30 ENCOUNTER — Ambulatory Visit (INDEPENDENT_AMBULATORY_CARE_PROVIDER_SITE_OTHER): Payer: Medicaid Other | Admitting: Infectious Diseases

## 2020-03-30 ENCOUNTER — Other Ambulatory Visit: Payer: Self-pay

## 2020-03-30 DIAGNOSIS — M549 Dorsalgia, unspecified: Secondary | ICD-10-CM

## 2020-03-30 DIAGNOSIS — S36409A Unspecified injury of unspecified part of small intestine, initial encounter: Secondary | ICD-10-CM | POA: Diagnosis not present

## 2020-03-30 DIAGNOSIS — Z21 Asymptomatic human immunodeficiency virus [HIV] infection status: Secondary | ICD-10-CM

## 2020-03-30 DIAGNOSIS — Z0000 Encounter for general adult medical examination without abnormal findings: Secondary | ICD-10-CM

## 2020-03-30 NOTE — Patient Instructions (Signed)
So glad to see you are recovering.   The only suggestion I have is to separate your Biktarvy from your first ensure by at least 4 hours apart so it is fully absorbed.   Will see you back in 6 months to check in on things with you.   I would be comfortable with you getting the COVID vaccine as soon as possible. Ibuprofen works better for aches / pains you may have associated with it but it is not likely to last beyond 36 hours and usually with the second dose.   Need the full vaccine series (both shots) to be considered immune so it takes typically about 5-6 weeks to be considered fully protected depending on which shot.

## 2020-03-30 NOTE — Assessment & Plan Note (Signed)
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 Counseled to check leg daily for injury and work with PT.
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
## 2020-03-30 NOTE — Assessment & Plan Note (Signed)
Counseled on COVID vaccine - I would be comfortable with him receiving at any time. He is most concerned about body aches following the vaccine; Ibuprofen works better for aches / pains you may have associated with it but it is not likely to last beyond 36 hours and usually with the second dose.   Need the full vaccine series (both shots) to be considered immune so it takes typically about 5-6 weeks to be considered fully protected depending on which shot.
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
## 2020-03-30 NOTE — Assessment & Plan Note (Signed)
S/P ostomy - hopeful for reversal soon. Has gastric study coming up to help determine. Continue pushing protein with anticipated elective surgery. No reservations on my behalf as his HIV is well controlled and CD4 recovery has been superior.

## 2020-04-03 DIAGNOSIS — Z932 Ileostomy status: Secondary | ICD-10-CM | POA: Diagnosis not present

## 2020-04-08 ENCOUNTER — Other Ambulatory Visit: Payer: Self-pay

## 2020-04-08 ENCOUNTER — Ambulatory Visit: Payer: Medicaid Other | Attending: Surgery

## 2020-04-08 DIAGNOSIS — R202 Paresthesia of skin: Secondary | ICD-10-CM | POA: Insufficient documentation

## 2020-04-08 DIAGNOSIS — M6281 Muscle weakness (generalized): Secondary | ICD-10-CM | POA: Diagnosis not present

## 2020-04-08 DIAGNOSIS — R252 Cramp and spasm: Secondary | ICD-10-CM

## 2020-04-08 DIAGNOSIS — M25612 Stiffness of left shoulder, not elsewhere classified: Secondary | ICD-10-CM | POA: Diagnosis not present
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
## 2020-04-09 ENCOUNTER — Ambulatory Visit: Payer: Medicaid Other

## 2020-04-13 ENCOUNTER — Telehealth: Payer: Self-pay

## 2020-04-13 ENCOUNTER — Other Ambulatory Visit: Payer: Self-pay

## 2020-04-13 DIAGNOSIS — Z713 Dietary counseling and surveillance: Secondary | ICD-10-CM

## 2020-04-13 MED ORDER — ENSURE PO LIQD: 237.0000 mL | Freq: Two times a day (BID) | ORAL | 5 refills | Status: DC

## 2020-04-13 NOTE — Progress Notes (Signed)
.  ens

## 2020-04-13 NOTE — Telephone Encounter (Signed)
Yes OK to fill Ensures from me.

## 2020-04-13 NOTE — Telephone Encounter (Signed)
Gerald Lee, Case Manager with [facility] asked CMA to forward message to [facility], NP regarding patient's ensure. States PCP has written script, but [facility] is unable to fill. Would like to ask if [facility] would be okay with taking prescription over. [facility] is not able to bill PCP. Will forward message. Lorenso Courier, [place]

## 2020-04-14 ENCOUNTER — Ambulatory Visit: Payer: Medicaid Other

## 2020-04-16 ENCOUNTER — Ambulatory Visit: Payer: Medicaid Other

## 2020-04-16 ENCOUNTER — Other Ambulatory Visit: Payer: Self-pay

## 2020-04-16 DIAGNOSIS — M6281 Muscle weakness (generalized): Secondary | ICD-10-CM

## 2020-04-16 DIAGNOSIS — M25612 Stiffness of left shoulder, not elsewhere classified: Secondary | ICD-10-CM

## 2020-04-16 DIAGNOSIS — R252 Cramp and spasm: Secondary | ICD-10-CM

## 2020-04-16 DIAGNOSIS — R202 Paresthesia of skin: Secondary | ICD-10-CM | POA: Diagnosis not present
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
## 2020-04-20 ENCOUNTER — Ambulatory Visit: Payer: Medicaid Other

## 2020-04-22 ENCOUNTER — Ambulatory Visit: Payer: Medicaid Other

## 2020-04-28 DIAGNOSIS — Z932 Ileostomy status: Secondary | ICD-10-CM | POA: Diagnosis not present

## 2020-04-28 DIAGNOSIS — M545 Low back pain: Secondary | ICD-10-CM | POA: Diagnosis not present

## 2020-04-28 DIAGNOSIS — F329 Major depressive disorder, single episode, unspecified: Secondary | ICD-10-CM | POA: Diagnosis not present

## 2020-04-28 DIAGNOSIS — J4520 Mild intermittent asthma, uncomplicated: Secondary | ICD-10-CM | POA: Diagnosis not present

## 2020-04-29 ENCOUNTER — Other Ambulatory Visit: Payer: Self-pay

## 2020-04-29 ENCOUNTER — Ambulatory Visit: Payer: Medicaid Other | Attending: Surgery

## 2020-04-29 DIAGNOSIS — R252 Cramp and spasm: Secondary | ICD-10-CM | POA: Diagnosis present

## 2020-04-29 DIAGNOSIS — M25612 Stiffness of left shoulder, not elsewhere classified: Secondary | ICD-10-CM | POA: Insufficient documentation

## 2020-04-29 DIAGNOSIS — R202 Paresthesia of skin: Secondary | ICD-10-CM | POA: Diagnosis not present

## 2020-04-29 DIAGNOSIS — M6281 Muscle weakness (generalized): Secondary | ICD-10-CM | POA: Diagnosis not present

## 2020-04-29 DIAGNOSIS — Z419 Encounter for procedure for purposes other than remedying health state, unspecified: Secondary | ICD-10-CM | POA: Diagnosis not present

## 2020-04-29 NOTE — Patient Instructions (Addendum)
  Access Code: [email or web address] URL: [email or web address] Date: 04/29/2020 Prepared by: Loralyn Freshwater  Exercises Seated Cervical Sidebending AROM - 3 x daily - 7 x weekly - 2 sets - 3 reps - 30 seconds hold Supine Head Nod Deep Neck Flexor Training - 3 x daily - 7 x weekly - 1 sets - 3 reps
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
## 2020-05-05 DIAGNOSIS — Z932 Ileostomy status: Secondary | ICD-10-CM | POA: Diagnosis not present

## 2020-05-06 ENCOUNTER — Other Ambulatory Visit: Payer: Self-pay

## 2020-05-06 ENCOUNTER — Ambulatory Visit: Payer: Medicaid Other

## 2020-05-06 ENCOUNTER — Encounter (INDEPENDENT_AMBULATORY_CARE_PROVIDER_SITE_OTHER): Payer: Self-pay | Admitting: *Deleted

## 2020-05-06 VITALS — BP 115/74 | HR 75 | Temp 98.5°F | Wt 307.2 lb

## 2020-05-06 DIAGNOSIS — Z006 Encounter for examination for normal comparison and control in clinical research program: Secondary | ICD-10-CM

## 2020-05-06 DIAGNOSIS — Z23 Encounter for immunization: Secondary | ICD-10-CM

## 2020-05-06 NOTE — Progress Notes (Signed)
   Covid-19 Vaccination Clinic  Name:  Gerald Lee    MRN: [id number] DOB: 1996-07-13  05/06/2020  [name] was observed post Covid-19 immunization for 15 minutes without incident. He was provided with Vaccine Information Sheet and instruction to access the V-Safe system.   [name] was instructed to call 911 with any severe reactions post vaccine: Marland Kitchen Difficulty breathing  . Swelling of face and throat  . A fast heartbeat  . A bad rash all over body  . Dizziness and weakness   Immunizations Administered    Name Date Dose VIS Date Route   Pfizer COVID-19 Vaccine 05/06/2020  2:02 PM 0.3 mL 10/23/2018 Intramuscular   Manufacturer: [employer], [employer]   Lot: [id number]   NDC: [address]-8     Rosanna Randy, RN

## 2020-05-06 NOTE — Research (Signed)
Gerald Lee is here for his month 6 SOLAR study visit. No new complaints or concerns. States that he is scheduled later this month for evaluation for  ostomy reversal. He is excited and hopefully about this possibility. Verbalized excellent adherence with his study medication. He forgot to return his unused medication and will bring it to his next visit. He did receive his 1st Covid vaccine today in the clinic. He will return in November for his next study visit.

## 2020-05-11 ENCOUNTER — Other Ambulatory Visit: Payer: Self-pay

## 2020-05-11 ENCOUNTER — Ambulatory Visit: Payer: Medicaid Other

## 2020-05-11 DIAGNOSIS — R202 Paresthesia of skin: Secondary | ICD-10-CM | POA: Diagnosis not present

## 2020-05-11 DIAGNOSIS — M6281 Muscle weakness (generalized): Secondary | ICD-10-CM | POA: Diagnosis not present

## 2020-05-11 DIAGNOSIS — M25612 Stiffness of left shoulder, not elsewhere classified: Secondary | ICD-10-CM | POA: Diagnosis not present

## 2020-05-11 DIAGNOSIS — R252 Cramp and spasm: Secondary | ICD-10-CM
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
## 2020-05-13 ENCOUNTER — Encounter: Payer: Self-pay | Admitting: Infectious Disease

## 2020-05-13 ENCOUNTER — Other Ambulatory Visit: Payer: Self-pay

## 2020-05-13 ENCOUNTER — Ambulatory Visit: Payer: Medicaid Other

## 2020-05-13 DIAGNOSIS — R202 Paresthesia of skin: Secondary | ICD-10-CM | POA: Diagnosis not present

## 2020-05-13 DIAGNOSIS — M6281 Muscle weakness (generalized): Secondary | ICD-10-CM | POA: Diagnosis not present

## 2020-05-13 DIAGNOSIS — M25612 Stiffness of left shoulder, not elsewhere classified: Secondary | ICD-10-CM

## 2020-05-13 DIAGNOSIS — R252 Cramp and spasm: Secondary | ICD-10-CM

## 2020-05-13 LAB — CD4/CD8 (T-HELPER/T-SUPPRESSOR CELL)
CD4 Count: 827
CD4%: 31
HIV 1 RNA Quant: <50
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
## 2020-05-14 ENCOUNTER — Ambulatory Visit: Payer: Medicaid Other

## 2020-05-18 ENCOUNTER — Ambulatory Visit: Payer: Medicaid Other

## 2020-05-20 DIAGNOSIS — Z932 Ileostomy status: Secondary | ICD-10-CM | POA: Diagnosis not present

## 2020-05-21 ENCOUNTER — Ambulatory Visit: Payer: Medicaid Other

## 2020-05-22 ENCOUNTER — Ambulatory Visit: Payer: Medicaid Other

## 2020-05-25 ENCOUNTER — Ambulatory Visit: Payer: Medicaid Other

## 2020-05-28 ENCOUNTER — Ambulatory Visit: Payer: Medicaid Other

## 2020-06-01 ENCOUNTER — Other Ambulatory Visit: Payer: Self-pay | Admitting: Infectious Diseases

## 2020-06-01 ENCOUNTER — Telehealth: Payer: Self-pay

## 2020-06-01 DIAGNOSIS — E43 Unspecified severe protein-calorie malnutrition: Secondary | ICD-10-CM | POA: Insufficient documentation

## 2020-06-01 NOTE — Telephone Encounter (Signed)
Added malnutrition to the problem list. Thanks.

## 2020-06-01 NOTE — Telephone Encounter (Signed)
Ensure refills provided by [facility] requiring chart documentation in problem list; should be related to calorie deficiency, or malnutrition.   Thanks! Heydi Swango Loyola Mast, RN

## 2020-06-11 ENCOUNTER — Telehealth: Payer: Self-pay

## 2020-06-11 NOTE — Telephone Encounter (Signed)
Called patient to check up on him to see how he is doing. L shoulder/neck still cramp up but the stretches help. Was told not to schedule any more PT until he gets the scan results for his ileostomy. Has not gotten the results yet. Wants to continue PT.

## 2020-06-30 DIAGNOSIS — Z9889 Other specified postprocedural states: Secondary | ICD-10-CM | POA: Insufficient documentation

## 2020-07-01 ENCOUNTER — Encounter: Payer: Medicaid Other | Admitting: *Deleted

## 2020-07-03 ENCOUNTER — Encounter: Payer: Medicaid Other | Admitting: *Deleted

## 2020-07-07 ENCOUNTER — Encounter (INDEPENDENT_AMBULATORY_CARE_PROVIDER_SITE_OTHER): Payer: Self-pay

## 2020-07-07 ENCOUNTER — Encounter: Payer: Self-pay | Admitting: Infectious Disease

## 2020-07-07 ENCOUNTER — Other Ambulatory Visit: Payer: Self-pay

## 2020-07-07 ENCOUNTER — Encounter (INDEPENDENT_AMBULATORY_CARE_PROVIDER_SITE_OTHER): Payer: Self-pay | Admitting: *Deleted

## 2020-07-07 VITALS — BP 110/71 | HR 76 | Temp 97.9°F | Wt 306.4 lb

## 2020-07-07 DIAGNOSIS — Z006 Encounter for examination for normal comparison and control in clinical research program: Secondary | ICD-10-CM

## 2020-07-07 NOTE — Research (Signed)
Gerald Lee seen today for his month 8 SOLAR study visit. He had surgery last week for ileostomy takedown. Dressing to area CD&I. States that he is feeling good. Still has some abdominal discomfort related to the surgery. Seen by Arvilla Meres PA-C for target exam per protocol. Excellent adherence with his study medication. He received 60 days of study provided BIK. He is scheduled to return in December for his next study visit.

## 2020-07-15 ENCOUNTER — Encounter: Payer: Self-pay | Admitting: Infectious Disease

## 2020-07-15 LAB — CD4/CD8 (T-HELPER/T-SUPPRESSOR CELL)
CD4 Count: 1068
CD4%: 33
HIV 1 RNA Quant: <50

## 2020-08-26 ENCOUNTER — Encounter (INDEPENDENT_AMBULATORY_CARE_PROVIDER_SITE_OTHER): Payer: Medicaid Other | Admitting: *Deleted

## 2020-08-26 ENCOUNTER — Other Ambulatory Visit: Payer: Self-pay

## 2020-08-26 VITALS — BP 113/71 | HR 74 | Temp 98.2°F | Wt 308.0 lb

## 2020-08-26 DIAGNOSIS — Z006 Encounter for examination for normal comparison and control in clinical research program: Secondary | ICD-10-CM

## 2020-08-26 NOTE — Research (Signed)
Lashun seen today for his month 10 SOLAR study visit. No new complaints or medications. Verbalized excellent adherence with study medication. Abdominal incision intact and healed. Study IP dispensed #60. Next study visit scheduled for 10/20/20 at 8am.

## 2020-09-02 ENCOUNTER — Encounter: Payer: Self-pay | Admitting: Infectious Disease

## 2020-09-02 LAB — CD4/CD8 (T-HELPER/T-SUPPRESSOR CELL)
CD4 Count: 1438
CD4%: 34
HIV 1 RNA Quant: <50

## 2020-09-10 ENCOUNTER — Emergency Department (HOSPITAL_COMMUNITY)
Admission: EM | Admit: 2020-09-10 | Discharge: 2020-09-11 | Disposition: A | Discharge status: Home / Self Care | Payer: Medicaid Other | Attending: Emergency Medicine | Admitting: Emergency Medicine

## 2020-09-10 ENCOUNTER — Other Ambulatory Visit: Payer: Self-pay

## 2020-09-10 DIAGNOSIS — U071 COVID-19: Secondary | ICD-10-CM | POA: Diagnosis not present

## 2020-09-10 DIAGNOSIS — Z21 Asymptomatic human immunodeficiency virus [HIV] infection status: Secondary | ICD-10-CM | POA: Diagnosis not present

## 2020-09-10 DIAGNOSIS — Z87891 Personal history of nicotine dependence: Secondary | ICD-10-CM | POA: Diagnosis not present

## 2020-09-10 DIAGNOSIS — J45909 Unspecified asthma, uncomplicated: Secondary | ICD-10-CM | POA: Diagnosis not present

## 2020-09-10 DIAGNOSIS — Z1152 Encounter for screening for COVID-19: Secondary | ICD-10-CM | POA: Diagnosis present

## 2020-09-10 LAB — SARS CORONAVIRUS 2 (TAT 6-24 HRS): SARS Coronavirus 2: POSITIVE — AB

## 2020-09-10 NOTE — ED Notes (Signed)
Ambulated pt on pulse ox to the room, O2 sat at 98%
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
## 2020-09-10 NOTE — ED Triage Notes (Signed)
Patient requesting COVID test. Denies any complaints, has no symptoms. States someone he lives with tested positive.

## 2020-09-11 NOTE — Discharge Instructions (Addendum)
Your COVID test was positive. You need to quarantine for 5 days. If you are still feeling well at 5 days you can go back out with a mask. If you are running a fever or having worsening symptoms, you need to continue quarantine until improving.

## 2020-09-18 ENCOUNTER — Other Ambulatory Visit: Payer: Self-pay

## 2020-09-18 ENCOUNTER — Telehealth (INDEPENDENT_AMBULATORY_CARE_PROVIDER_SITE_OTHER): Payer: Medicaid Other | Admitting: Infectious Diseases

## 2020-09-18 VITALS — Ht 76.0 in | Wt 308.0 lb

## 2020-09-18 DIAGNOSIS — Z713 Dietary counseling and surveillance: Secondary | ICD-10-CM | POA: Diagnosis not present

## 2020-09-18 DIAGNOSIS — Z6835 Body mass index (BMI) 35.0-35.9, adult: Secondary | ICD-10-CM

## 2020-09-18 DIAGNOSIS — Z21 Asymptomatic human immunodeficiency virus [HIV] infection status: Secondary | ICD-10-CM | POA: Diagnosis not present

## 2020-09-18 DIAGNOSIS — Z8616 Personal history of COVID-19: Secondary | ICD-10-CM | POA: Insufficient documentation

## 2020-09-18 DIAGNOSIS — E6609 Other obesity due to excess calories: Secondary | ICD-10-CM

## 2020-09-18 NOTE — Patient Instructions (Signed)
[email or web address] 

## 2020-09-18 NOTE — Progress Notes (Signed)
Name: Gerald Lee  DOB: 11-07-1995  MRN: [id number] PCP: [name], MD     VIRTUAL CARE ENCOUNTER  I connected with [name] on 09/18/20 at  9:30 AM EST by TELEPHONE and verified that I am speaking with the correct person using two identifiers.   I discussed the limitations, risks, security and privacy concerns of performing an evaluation and management service by telephone and the availability of in person appointments. I also discussed with the patient that there may be a patient responsible charge related to this service. The patient expressed understanding and agreed to proceed.  Patient Location: [facility]   Other Participants:   Provider Location: [facility] Office     Brief Narrative:  Dakai is a 25 y.o. AA male with well controlled HIV, Dx 04-2017 with CD4 nadir 780.  Hep B sAg (-) High Risk: MSM.  Previous OIs: none  Previous Regimens:   Biktarvy 2018 --> suppressed   Genotype:   02/2017 - no mutations   Subjective:   Chief Complaint  Patient presents with  . Follow-up    Pos covid 1 week ago no symtoms       HPI:  He recovered from COVID last week - had no symptoms at all.   Started going to the gym. Had ostomy reversed back in November 2021 and he has done well since then.   Mentally and emotionally having ups and down still. Lost grandfather before Christmas, but he states he has been doing better and feels better with adding the gym.   Diet recall - he sometimes skips breakfast if he wakes up late. Likes pasta a lot, eats rice, chicken and leaner meats when he can. He has to force himself to eat sometimes. Has tried to cut out all sodas. Drinking Bodyarmor drinks instead of juice, but mostly focuses on water intake.   Current weight is 308, goal weight is 250 lbs and to tone up.   Sleep is OK for the most part.    Review of Systems    Past Medical History:  Diagnosis Date  . Asthma   . Headache(784.0)   . HIV infection (HCC)    . Orthostatic hypotension     Social History   Tobacco Use  . Smoking status: Former Smoker    Packs/day: 0.10    Types: Cigarettes, Cigars    Quit date: 11/29/2019    Years since quitting: 0.8  . Smokeless tobacco: Never Used  . Tobacco comment: "only when super stressed"  Vaping Use  . Vaping Use: Some days  Substance Use Topics  . Alcohol use: Yes    Comment: occ; drink daily  . Drug use: Yes    Frequency: 7.0 times per week    Types: Marijuana    Comment: occassionally    Allergies  Allergen Reactions  . Ibuprofen Other (See Comments)    Per patient "can not take because it will shut down my kidneys"    Objective:  Vitals:   09/18/20 0942  Weight: (!) 308 lb (139.7 kg)  Height: 6\' 4"  (1.93 m)   Body mass index is 37.49 kg/m.  Physical Exam Pulmonary:     Effort: Pulmonary effort is normal.     Comments: No shortness of breath detected in conversation.  Neurological:     Mental Status: He is oriented to person, place, and time.  Psychiatric:        Mood and Affect: Mood normal.  Behavior: Behavior normal.        Thought Content: Thought content normal.        Judgment: Judgment normal.      Lab Results Lab Results  Component Value Date   WBC 7.2 03/11/2019   HGB 16.1 03/11/2019   HCT 48.5 03/11/2019   MCV 87.1 03/11/2019   PLT 242 03/11/2019    Lab Results  Component Value Date   CREATININE 1.17 03/11/2019   BUN 12 03/11/2019   NA 140 03/11/2019   K 4.5 03/11/2019   CL 104 03/11/2019   CO2 29 03/11/2019    Lab Results  Component Value Date   ALT 13 03/11/2019   AST 14 03/11/2019   ALKPHOS 113 01/06/2019   BILITOT 0.5 03/11/2019    Lab Results  Component Value Date   CHOL 132 03/11/2019   HDL 39 (L) 03/11/2019   LDLCALC 78 03/11/2019   TRIG 66 03/11/2019   CHOLHDL 3.4 03/11/2019   HIV 1 RNA Quant (no units)  Date Value  08/26/2020 <50  07/07/2020 <50  05/06/2020 <50   CD4 T Cell Abs (/uL)  Date Value  09/10/2019  1,014  03/11/2019 968  06/11/2018 1,250   Lab Results  Component Value Date   HAV REACTIVE (A) 05/22/2017   Lab Results  Component Value Date   HEPBSAG NON-REACTIVE 05/22/2017   HEPBSAB REACTIVE (A) 03/11/2019    Problem List Items Addressed This Visit      Unprioritized   Weight loss counseling, encounter for   Obesity, unspecified - Primary (Chronic)    Discussed options for weight loss management over the phone. He has started incorporating exercise and this has improved his mood. Discussed importance of adequate sleep. He may need to increase his calories a bit from what I am hearing and focus on increasing protein sources. Briefly discussed macronutrients and reading nutrition lables. Focus on reducing refined carbohydrates, sugar and refined fats.  Will place referral to weight management clinic for more comprehensive approach.       Relevant Orders   Amb Ref to Medical Weight Management   History of COVID-19    Recovered well and essentially asymptomatic for infection following vaccination. He is eligible for his booster anytime 5 months from completion of initial vaccine. Would suggest getting it at latest 90 days after he recovered from infection.       Asymptomatic HIV infection (HCC)    Starting first injection in SOLAR study in February. He will come see me in person as planned in February so we can update blood work and get a current weight on him.         Follow Up Instructions: As above    I discussed the assessment and treatment plan with the patient. The patient was provided an opportunity to ask questions and all were answered. The patient agreed with the plan and demonstrated an understanding of the instructions.   The patient was advised to call back or seek an in-person evaluation if the symptoms worsen or if the condition fails to improve as anticipated.  I provided 11 minutes of non-face-to-face time during this encounter.  Rexene Alberts, MSN,  NP-C [facility] [place] [facility]  [email or web address] Pager: [phone] Office: [phone] [facility] Main Line: [phone]   09/18/20

## 2020-09-18 NOTE — Assessment & Plan Note (Signed)
Recovered well and essentially asymptomatic for infection following vaccination. He is eligible for his booster anytime 5 months from completion of initial vaccine. Would suggest getting it at latest 90 days after he recovered from infection.

## 2020-09-18 NOTE — Assessment & Plan Note (Signed)
Discussed options for weight loss management over the phone. He has started incorporating exercise and this has improved his mood. Discussed importance of adequate sleep. He may need to increase his calories a bit from what I am hearing and focus on increasing protein sources. Briefly discussed macronutrients and reading nutrition lables. Focus on reducing refined carbohydrates, sugar and refined fats.  Will place referral to weight management clinic for more comprehensive approach.

## 2020-09-18 NOTE — Assessment & Plan Note (Signed)
Starting first injection in [place] study in February. He will come see me in person as planned in February so we can update blood work and get a current weight on him.

## 2020-10-05 ENCOUNTER — Ambulatory Visit: Payer: Medicaid Other | Admitting: Infectious Diseases

## 2020-10-20 ENCOUNTER — Encounter: Payer: Medicaid Other | Admitting: *Deleted

## 2020-10-21 ENCOUNTER — Encounter: Payer: Medicaid Other | Admitting: *Deleted

## 2020-10-23 ENCOUNTER — Encounter (INDEPENDENT_AMBULATORY_CARE_PROVIDER_SITE_OTHER): Payer: Self-pay | Admitting: *Deleted

## 2020-10-23 ENCOUNTER — Other Ambulatory Visit: Payer: Self-pay

## 2020-10-23 VITALS — BP 106/74 | HR 65 | Temp 98.4°F | Wt 310.4 lb

## 2020-10-23 DIAGNOSIS — Z006 Encounter for examination for normal comparison and control in clinical research program: Secondary | ICD-10-CM

## 2020-10-23 NOTE — Research (Signed)
Gerald Lee seen today for his Month 57 SOLAR visit. States that he has been very adherent with his oral IP. He did test positive for Covid in January. He was asymptomatic. No new complaints or medications. This is his final visit for the maintenance phase of the study. He was given a month supply of oral IP (BIK). He will return in March to transition to the extension phase of the study and will switch to cabotegravir and rilpilvirine. He is interested in moving directly to injections. We did discuss the study coming to an end in the near future. We discussed transition of treatment back to the clinic at that time. He is excited about starting the injections at his next visit.

## 2020-10-30 LAB — HEPATIC FUNCTION PANEL
ALT: 17 (ref 10–40)
AST: 18 (ref 14–40)
Alkaline Phosphatase: 123 (ref 25–125)
Bilirubin, Total: 0.3

## 2020-10-30 LAB — BASIC METABOLIC PANEL
BUN: 12 (ref 4–21)
CO2: 24 — AB (ref 13–22)
Chloride: 101 (ref 99–108)
Creatinine: 1.1 (ref 0.6–1.3)
Glucose: 95
Potassium: 4.1 (ref 3.4–5.3)
Sodium: 138 (ref 137–147)

## 2020-10-30 LAB — COMPREHENSIVE METABOLIC PANEL
Albumin: 4 (ref 3.5–5.0)
GFR calc Af Amer: 1.07

## 2020-10-30 LAB — CD4/CD8 (T-HELPER/T-SUPPRESSOR CELL)
Absolute CD4: 1229
CD4%: 34
CD8 % Suppressor T Cell: 41
CD8 T Cell Abs: 1456

## 2020-10-30 LAB — CBC AND DIFFERENTIAL
HCT: 41 (ref 41–53)
Hemoglobin: 13.6 (ref 13.5–17.5)
Neutrophils Absolute: 3.78
Platelets: 226 (ref 150–399)
WBC: 7.7

## 2020-10-30 LAB — HEMOGLOBIN A1C: Hemoglobin A1C: 5.4

## 2020-10-30 LAB — LIPID PANEL
Cholesterol: 109 (ref 0–200)
HDL: 38 (ref 35–70)
LDL Cholesterol: 62
Triglycerides: 43 (ref 40–160)

## 2020-10-30 LAB — CBC: RBC: 4.8 (ref 3.87–5.11)

## 2020-10-30 LAB — LIPASE: Lipase: 21.4

## 2020-10-30 LAB — MICROALBUMIN, URINE: Microalb, Ur: <12

## 2020-10-30 LAB — HIV-1 RNA QUANT-NO REFLEX-BLD: Copies/mL: <50

## 2020-10-30 LAB — VITAMIN D 25 HYDROXY (VIT D DEFICIENCY, FRACTURES): Vit D, 25-Hydroxy: 17

## 2020-11-17 ENCOUNTER — Encounter: Payer: Medicaid Other | Admitting: *Deleted

## 2020-11-19 ENCOUNTER — Encounter: Payer: Medicaid Other | Admitting: *Deleted

## 2020-11-20 ENCOUNTER — Encounter (INDEPENDENT_AMBULATORY_CARE_PROVIDER_SITE_OTHER): Payer: Self-pay | Admitting: *Deleted

## 2020-11-20 ENCOUNTER — Other Ambulatory Visit: Payer: Self-pay

## 2020-11-20 VITALS — BP 115/74 | HR 72 | Temp 97.6°F | Wt 311.2 lb

## 2020-11-20 DIAGNOSIS — Z006 Encounter for examination for normal comparison and control in clinical research program: Secondary | ICD-10-CM

## 2020-11-20 MED ORDER — STUDY - SOLAR - CABOTEGRAVIR 600MG/3ML INJECTION (PI-VAN DAM): 600.0000 mg | INJECTION | INTRAMUSCULAR | Status: DC

## 2020-11-20 MED ORDER — STUDY - SOLAR - RILPIVERINE 900MG/3ML INJECTION (PI-VAN DAM): 900.0000 mg | INJECTION | INTRAMUSCULAR | Status: DC

## 2020-11-20 NOTE — Research (Signed)
Gerald Lee here today for his Month 93 SOLAR visit. No new complaints or medications. Excellent adherence with his Biktarvy. He is transitioning to the extension phase of the study today and has decided to go directly to injections. We reviewed potential adverse effects including possible injection site reactions. States that he is excited about finally getting to switch to the injections. He took his last dose of biktarvy today at his visit and then received cabotegravir and rilpilvirine injections to each glute. We did discuss that the study is coming to an end. He understands that he will be transitioning to the clinic to receive his injections after his next visit. He is scheduled to return on 12/23/20 for his next study visit and injections.

## 2020-12-10 ENCOUNTER — Other Ambulatory Visit (HOSPITAL_COMMUNITY)
Admission: RE | Admit: 2020-12-10 | Discharge: 2020-12-10 | Disposition: A | Discharge status: Home / Self Care | Payer: Medicaid Other | Source: Ambulatory Visit | Attending: Infectious Diseases | Admitting: Infectious Diseases

## 2020-12-10 ENCOUNTER — Ambulatory Visit (INDEPENDENT_AMBULATORY_CARE_PROVIDER_SITE_OTHER): Payer: Medicaid Other | Admitting: Infectious Diseases

## 2020-12-10 ENCOUNTER — Other Ambulatory Visit: Payer: Self-pay

## 2020-12-10 ENCOUNTER — Encounter: Payer: Self-pay | Admitting: Infectious Diseases

## 2020-12-10 ENCOUNTER — Telehealth: Payer: Self-pay

## 2020-12-10 VITALS — BP 131/80 | HR 73 | Temp 98.1°F | Wt 305.0 lb

## 2020-12-10 DIAGNOSIS — Z8619 Personal history of other infectious and parasitic diseases: Secondary | ICD-10-CM

## 2020-12-10 DIAGNOSIS — E669 Obesity, unspecified: Secondary | ICD-10-CM

## 2020-12-10 DIAGNOSIS — Z21 Asymptomatic human immunodeficiency virus [HIV] infection status: Secondary | ICD-10-CM

## 2020-12-10 DIAGNOSIS — Z113 Encounter for screening for infections with a predominantly sexual mode of transmission: Secondary | ICD-10-CM | POA: Insufficient documentation

## 2020-12-10 DIAGNOSIS — Z202 Contact with and (suspected) exposure to infections with a predominantly sexual mode of transmission: Secondary | ICD-10-CM

## 2020-12-10 MED ORDER — CEFTRIAXONE SODIUM 500 MG IJ SOLR
500.0000 mg | Freq: Once | INTRAMUSCULAR | Status: AC
  Administered 2020-12-10: 500 mg via INTRAMUSCULAR

## 2020-12-10 NOTE — Telephone Encounter (Signed)
Patient called stating he was informed that he had came in contact with someone with has gonorrhea. Patient has been scheduled with Dr. Elinor Parkinson today for evaluation. Patient denies any symptoms at this time. Gerald Lee

## 2020-12-10 NOTE — Progress Notes (Signed)
[facility], [place], [place], [address]                                                                  Phn. [phone]; Fax: [phone]                                                                             Date: 12/09/20  Reason for Visit: STI exposure   HPI: Gerald Lee is a 25 y.o.old male with a history of HIV, syphilis who is following with [facility] research team and is on SOLAR study ( Cabotegravir and Rilpivirine) who is here for STD screening after he was notified by his partner that he was tested positive for Gonorrhea.   Last Sexual encounter was March 23 with a male partner - anal intercourse , did not use  Protection. Denies any GU complaints - urinary burning, dysuria, penile pain, discharge, penile ulcers, rectal pain/discharge or rashes. Denies fevers, chills and sweats.   Work at a [employer] facility  and [employer] Smokes marijuana, alcohol once a week, denies any IVDU He is scheduled to follow up for his Injections for his HIV ( swell controlled)  ROS: Denies dysphagia, odynophagia, cough, fever, nausea, vomiting, diarrhea, constipation, weight loss, chills, night sweats, recent hospitalizations, rashes, joint complaints, shortness of breath, headaches, chest pain, abdominal pain, dysuria .  Current Outpatient Medications on File Prior to Visit  Medication Sig Dispense Refill  . acetaminophen (TYLENOL) 325 MG tablet Take by mouth.    . Cholecalciferol 25 MCG (1000 UT) tablet Take by mouth.    . Ensure (ENSURE) Take 237 mLs by mouth 2 (two) times daily between meals. 237 mL 5  . loratadine (CLARITIN) 10 MG tablet Take 10 mg by mouth daily.    . methocarbamol (ROBAXIN) 500 MG tablet Take 500 mg by mouth 3 (three) times daily.    . Study - SOLAR - cabotegravir 600 mg/3  mL intramuscular injection ([place]) Inject 3 mLs (600 mg total) into the muscle every 8 (eight) weeks.    . Study - SOLAR - rilpiverine 900 mg/3 mL intramuscular injection ([place]) Inject 3 mLs (900 mg total) into the muscle every 8 (eight) weeks.    . Vitamin D, Ergocalciferol, (DRISDOL) 1.25 MG (50000 UNIT) CAPS capsule Take 50,000 Units by mouth once a week.    . gabapentin (NEURONTIN) 100 MG capsule Take 100 mg by mouth at bedtime. (Patient not taking: No sig reported)    . hydrOXYzine (ATARAX/VISTARIL) 25 MG tablet Take 1 tablet (25 mg total) by mouth 3 (three) times daily as needed for anxiety. (Patient not taking: No sig reported)  30 tablet 0  . venlafaxine (EFFEXOR) 37.5 MG tablet Take 37.5 mg by mouth 2 (two) times daily. (Patient not taking: No sig reported)     No current facility-administered medications on file prior to visit.                                         Allergies  Allergen Reactions  . Ibuprofen Other (See Comments)    Per patient "can not take because it will shut down my kidneys"   Allergies  Allergen Reactions  . Ibuprofen Other (See Comments)    Per patient "can not take because it will shut down my kidneys"   Social History   Socioeconomic History  . Marital status: Single    Spouse name: Not on file  . Number of children: Not on file  . Years of education: Not on file  . Highest education level: Not on file  Occupational History  . Not on file  Tobacco Use  . Smoking status: Former Smoker    Packs/day: 0.10    Types: Cigarettes, Cigars    Quit date: 11/29/2019    Years since quitting: 1.0  . Smokeless tobacco: Never Used  . Tobacco comment: "only when super stressed"  Vaping Use  . Vaping Use: Some days  Substance and Sexual Activity  . Alcohol use: Yes    Comment: occ; drink daily  . Drug use: Yes    Frequency: 7.0 times per week    Types: Marijuana    Comment: occassionally  . Sexual activity: Yes    Partners: Male    Birth  control/protection: Condom    Comment: declined condoms 1/21  Other Topics Concern  . Not on file  Social History Narrative  . Not on file   Social Determinants of Health   Financial Resource Strain: Not on file  Food Insecurity: Not on file  Transportation Needs: Not on file  Physical Activity: Not on file  Stress: Not on file  Social Connections: Not on file  Intimate Partner Violence: Not on file    Vitals  Wt (!) 305 lb (138.3 kg)   BMI 37.13 kg/m    Examination  Gen: Alert and oriented x 3, no acute distress, OBESE  HEENT: [place]/AT, PERL, EOMI, no scleral icterus, no pale conjunctivae, hearing normal, oral mucosa moist, NO THRUSH Neck: Supple, no lymphadenopathy Cardio: Regular rate and rhythm; +S1 and S2; no murmurs, gallops, or rubs Resp: CTAB; no wheezes, rhonchi, or rales GI: Soft, nontender, nondistended, bowel sounds present GU: Musc: Extremities: No cyanosis, clubbing, or edema; +2 PT and DP pulses Skin: No rashes, lesions, or ecchymoses Neuro: No focal deficits Psych: Calm, cooperative   Lab Results HIV 1 RNA Quant (no units)  Date Value  08/26/2020 <50  07/07/2020 <50  05/06/2020 <50   CD4 T Cell Abs (/uL)  Date Value  09/10/2019 1,014  03/11/2019 968  06/11/2018 1,250   No results found for: HIV1GENOSEQ Lab Results  Component Value Date   WBC 7.7 10/30/2020   HGB 13.6 10/30/2020   HCT 41 10/30/2020   MCV 87.1 03/11/2019   PLT 226 10/30/2020    Lab Results  Component Value Date   CREATININE 1.1 10/30/2020   BUN 12 10/30/2020   NA 138 10/30/2020   K 4.1 10/30/2020   CL 101 10/30/2020   CO2 24 (A) 10/30/2020   Lab Results  Component Value Date   ALT 17 10/30/2020   AST 18 10/30/2020   ALKPHOS 123 10/30/2020   BILITOT 0.5 03/11/2019    Lab Results  Component Value Date   CHOL 109 10/30/2020   TRIG 43 10/30/2020   HDL 38 10/30/2020   LDLCALC 62 10/30/2020   Lab Results  Component Value Date   HAV REACTIVE (A) 05/22/2017    Lab Results  Component Value Date   HEPBSAG NON-REACTIVE 05/22/2017   HEPBSAB REACTIVE (A) 03/11/2019   No results found for: HCVAB Lab Results  Component Value Date   CHLAMYDIAWP Negative 10/16/2019   N Negative 10/16/2019   No results found for: GCPROBEAPT No results found for: [place] Maintenance: Immunization History  Administered Date(s) Administered  . DTaP 11/29/1996, 01/29/1997, 04/02/1997, 01/07/1998  . HPV 9-valent 09/11/2017, 11/20/2017, 02/27/2018  . Hepatitis B 05-06-1997, 11/29/1996, 07/11/1997  . Hepatitis B, adult 07/11/2017, 09/11/2017, 01/11/2018  . HiB (PRP-OMP) 11/29/1996, 01/29/1997, 04/02/1997, 01/07/1998  . IPV 01/29/1997, 10/17/1997  . Influenza,inj,Quad PF,6+ Mos 05/22/2017, 06/11/2018  . MMR 10/17/1997  . Meningococcal Mcv4o 02/27/2018, 06/11/2018  . PFIZER(Purple Top)SARS-COV-2 Vaccination 05/06/2020  . Pneumococcal Conjugate-13 10/29/2018  . Pneumococcal Polysaccharide-23 06/22/2017  . Td 04/18/2008  . Tdap 06/20/2017, 01/06/2020  . Varicella 04/29/1998    Assessment/Plan: HIV in a [facility] - well controlled  Follow up with [facility] research for Cabotegravir/Rilpivirine injections ( SOLAR STUDY) 12/23/20  H/o syphilis RPR today   STD exposure ( Gonorrhea) One dose of 532m IM ceftriaxone today   STD Screening Urine/oral and anal GC RPR   Obesity  Discussed about exercise and healthy lifestyle  Immunization Discussed about vaccines recommended in [place] Patient's labs were reviewed as well as his previous records. Patients questions were addressed and answered. Safe sex counseling done.   I have personally spent 30  minutes involved in face-to-face and non-face-to-face activities for this patient on the day of the visit.    Electronically signed by:  SRosiland Oz MD Infectious Disease Physician [facility] [address] [place] [address]Phone:  [phone] Fax: [phone]

## 2020-12-11 ENCOUNTER — Other Ambulatory Visit (INDEPENDENT_AMBULATORY_CARE_PROVIDER_SITE_OTHER): Payer: Medicaid Other | Admitting: Infectious Diseases

## 2020-12-11 LAB — URINE CYTOLOGY ANCILLARY ONLY
Chlamydia: NEGATIVE
Comment: NEGATIVE
Comment: NEGATIVE
Comment: NORMAL
Neisseria Gonorrhea: NEGATIVE
Trichomonas: NEGATIVE

## 2020-12-11 LAB — CYTOLOGY, (ORAL, ANAL, URETHRAL) ANCILLARY ONLY
Chlamydia: POSITIVE — AB
Chlamydia: POSITIVE — AB
Comment: NEGATIVE
Comment: NEGATIVE
Comment: NORMAL
Comment: NORMAL
Neisseria Gonorrhea: NEGATIVE
Neisseria Gonorrhea: NEGATIVE

## 2020-12-11 MED ORDER — DOXYCYCLINE HYCLATE 100 MG PO TABS: 100.0000 mg | ORAL_TABLET | Freq: Two times a day (BID) | ORAL | 0 refills | Status: DC

## 2020-12-11 NOTE — Progress Notes (Signed)
Informed patient of positive chlamydia test from oral and anal region. Sent script of Doxycyline 100mg  PO Bid for 7 days. Instructed to abstain from sexual intercourse for 7 days post treatment.

## 2020-12-14 ENCOUNTER — Telehealth: Payer: Self-pay

## 2020-12-14 LAB — RPR: RPR Ser Ql: REACTIVE — AB

## 2020-12-14 LAB — FLUORESCENT TREPONEMAL AB(FTA)-IGG-BLD: Fluorescent Treponemal ABS: REACTIVE — AB

## 2020-12-14 LAB — RPR TITER: RPR Titer: 1:16 {titer} — ABNORMAL HIGH

## 2020-12-14 NOTE — Telephone Encounter (Signed)
-----   Message from Blanchard Kelch, NP sent at 12/14/2020  9:45 AM EDT ----- Please call Gerald Lee to let him know his chlamydia test was positive on 2 of his swabs.   Please send in doxycycline 100 mg bid x 7 days, #14, 0 refills to treat this.  Notify recent partners  No sex of any form for 2 weeks please.

## 2020-12-14 NOTE — Telephone Encounter (Signed)
Received return call from patient, he states he has already been notified of results and has picked up the doxycyline and started taking it.   Sandie Ano, RN

## 2020-12-14 NOTE — Telephone Encounter (Signed)
RN attempted to call patient to relay results per Rexene Alberts, NP, no answer. Left HIPAA compliant voicemail requesting callback. Will send MyChart message.   Sandie Ano, RN

## 2020-12-18 ENCOUNTER — Other Ambulatory Visit (HOSPITAL_COMMUNITY): Payer: Self-pay

## 2020-12-23 ENCOUNTER — Other Ambulatory Visit: Payer: Self-pay

## 2020-12-23 ENCOUNTER — Encounter: Payer: Self-pay | Admitting: *Deleted

## 2020-12-23 VITALS — BP 123/75 | HR 69 | Temp 98.3°F | Wt 307.5 lb

## 2020-12-23 DIAGNOSIS — Z006 Encounter for examination for normal comparison and control in clinical research program: Secondary | ICD-10-CM

## 2020-12-23 NOTE — Research (Signed)
Gerald Lee here today for his final SOLAR study visit. States that he did have mild tenderness at his injection sites after his initial injections. No other complaints verbalized. He did receive cabotegravir and rilpilvirine injections to each upper outer glute area today. He will return in June for his next injections with the [facility] pharmacist.

## 2021-02-01 ENCOUNTER — Other Ambulatory Visit: Payer: Self-pay | Admitting: Pharmacist

## 2021-02-01 ENCOUNTER — Other Ambulatory Visit (HOSPITAL_COMMUNITY): Payer: Self-pay

## 2021-02-01 DIAGNOSIS — Z21 Asymptomatic human immunodeficiency virus [HIV] infection status: Secondary | ICD-10-CM

## 2021-02-01 MED ORDER — CABENUVA 600 & 900 MG/3ML IM SUER
1.0000 | INTRAMUSCULAR | 5 refills | Status: DC
  Filled 2021-02-01 – 2021-02-10 (×2): qty 6, 60d supply, fill #0
  Filled 2021-04-13: qty 6, 60d supply, fill #1
  Filled 2021-06-11: qty 6, 60d supply, fill #2
  Filled 2021-08-10: qty 6, 60d supply, fill #3
  Filled 2021-10-11: qty 6, 60d supply, fill #4
  Filled 2021-12-02: qty 6, 60d supply, fill #5

## 2021-02-01 NOTE — Progress Notes (Signed)
Sending Cabenuva to [facility] to prepare for patient's transition off of our Solar research study. He will see me on 6/22 for transition appointment and injection.

## 2021-02-08 ENCOUNTER — Other Ambulatory Visit (HOSPITAL_COMMUNITY): Payer: Self-pay

## 2021-02-10 ENCOUNTER — Telehealth: Payer: Self-pay

## 2021-02-10 ENCOUNTER — Other Ambulatory Visit (HOSPITAL_COMMUNITY): Payer: Self-pay

## 2021-02-10 NOTE — Telephone Encounter (Signed)
[facility] Patient Advocate Encounter  Patient's medication Renaldo Harrison) have been couriered to [facility] from [facility] and will be administered on patient next office visit on 02/17/21.  [name] , CPhT Specialty Pharmacy Patient [facility] for Infectious Disease Phone: [phone] Fax:  [phone]

## 2021-02-17 ENCOUNTER — Ambulatory Visit (INDEPENDENT_AMBULATORY_CARE_PROVIDER_SITE_OTHER): Payer: Medicaid Other | Admitting: Pharmacist

## 2021-02-17 ENCOUNTER — Other Ambulatory Visit: Payer: Self-pay

## 2021-02-17 DIAGNOSIS — Z21 Asymptomatic human immunodeficiency virus [HIV] infection status: Secondary | ICD-10-CM

## 2021-02-17 MED ORDER — CABOTEGRAVIR & RILPIVIRINE ER 600 & 900 MG/3ML IM SUER
1.0000 | Freq: Once | INTRAMUSCULAR | Status: AC
  Administered 2021-02-17: 1 via INTRAMUSCULAR

## 2021-02-17 NOTE — Progress Notes (Signed)
HPI: Gerald Lee is a 25 y.o. male who presents to the [facility] for [place] administration.  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   CABOTEGRAVIR & RILPIVIRINE ER (CABENUVA) 600 & 900 MG/3ML INJECTION    Inject 1 kit into the muscle every 2 (two) months.   CHOLECALCIFEROL 25 MCG (1000 UT) TABLET    Take by mouth.   DOXYCYCLINE (VIBRA-TABS) 100 MG TABLET    Take 1 tablet (100 mg total) by mouth 2 (two) times daily.   ENSURE (ENSURE)    Take 237 mLs by mouth 2 (two) times daily between meals.   LORATADINE (CLARITIN) 10 MG TABLET    Take 10 mg by mouth daily.   METHOCARBAMOL (ROBAXIN) 500 MG TABLET    Take 500 mg by mouth 3 (three) times daily.   VITAMIN D, ERGOCALCIFEROL, (DRISDOL) 1.25 MG (50000 UNIT) CAPS CAPSULE    Take 50,000 Units by mouth once a week.  Modified Medications   No medications on file  Discontinued Medications   No medications on file    Allergies: Allergies  Allergen Reactions   Ibuprofen Other (See Comments)    Per patient "can not take because it will shut down my kidneys"    Past Medical History: Past Medical History:  Diagnosis Date   Asthma    Headache(784.0)    HIV infection ([place])    Orthostatic hypotension     Social  History: Social History   Socioeconomic History   Marital status: Single    Spouse name: Not on file   Number of children: Not on file   Years of education: Not on file   Highest education level: Not on file  Occupational History   Not on file  Tobacco Use   Smoking status: Former    Packs/day: 0.10    Pack years: 0.00    Types: Cigarettes, Cigars    Quit date: 11/29/2019    Years since quitting: 1.2   Smokeless tobacco: Never   Tobacco comments:    "only when super stressed"  Vaping Use   Vaping Use: Some days  Substance and Sexual Activity   Alcohol use: Yes    Comment: occ; drink daily   Drug use: Yes    Frequency: 7.0 times per week    Types: Marijuana    Comment: occassionally   Sexual activity: Yes    Partners: Male    Birth control/protection: Condom    Comment: declined condoms 1/21  Other Topics Concern   Not on file  Social History Narrative   Not on file   Social Determinants of Health   Financial Resource Strain: Not on file  Food Insecurity: Not on file  Transportation Needs: Not on file  Physical Activity: Not on file  Stress: Not on file  Social  Connections: Not on file    Labs: Lab Results  Component Value Date   HIV1RNAQUANT <50 08/26/2020   HIV1RNAQUANT <50 07/07/2020   HIV1RNAQUANT <50 05/06/2020   CD4TABS 1,014 09/10/2019   CD4TABS 968 03/11/2019   CD4TABS 1,250 06/11/2018    RPR and STI Lab Results  Component Value Date   LABRPR REACTIVE (A) 12/10/2020   LABRPR REACTIVE (A) 10/16/2019   LABRPR REACTIVE (A) 09/10/2019   LABRPR REACTIVE (A) 03/11/2019   LABRPR Reactive (A) 01/06/2019   RPRTITER 1:16 (H) 12/10/2020   RPRTITER 1:128 (H) 10/16/2019   RPRTITER 1:16 (H) 09/10/2019   RPRTITER 1:8 (H) 03/11/2019   RPRTITER 1:2 (H) 11/20/2017    STI Results GC CT  12/10/2020 Negative Negative  12/10/2020 Negative Positive(A)  12/10/2020 Negative Positive(A)  10/16/2019 Negative Negative  05/11/2019 **POSITIVE**(A) **POSITIVE**(A)   03/11/2019 Negative Negative  01/06/2019 Negative Negative  10/01/2018 **POSITIVE**(A) Negative  06/11/2018 Negative Negative  06/11/2018 Negative Negative  06/11/2018 Negative Negative  02/16/2018 Negative Negative  10/13/2017 Negative Negative  09/11/2017 Negative Negative  05/22/2017 Negative **POSITIVE**(A)  05/22/2017 Negative Negative  05/17/2017 Negative Negative    Hepatitis B Lab Results  Component Value Date   HEPBSAB REACTIVE (A) 03/11/2019   HEPBSAG NON-REACTIVE 05/22/2017   Hepatitis C Lab Results  Component Value Date   HEPCAB NON-REACTIVE 05/22/2017   Hepatitis A Lab Results  Component Value Date   HAV REACTIVE (A) 05/22/2017   Lipids: Lab Results  Component Value Date   CHOL 109 10/30/2020   TRIG 43 10/30/2020   HDL 38 10/30/2020   CHOLHDL 3.4 03/11/2019   LDLCALC 62 10/30/2020    Current HIV Regimen: Cabenuva  Assessment: Gerald Lee presents today for their maintenance Cabenuva injections. Initial injections were tolerated well without issues. No problems with systemic effects of injections. Patient did experience some mild pain with the injections. No nodules present.   Administered cabotegravir 624m/3mL in left upper outer quadrant of the gluteal muscle. Administered rilpivirine 900 mg/382min the right upper outer quadrant of the gluteal muscle. Monitored patient for 10 minutes after injection. Injections were tolerated well without issue. Patient will follow up in 2 months for next injection. Will check a HIV RNA today.  Plan: - Cabenuva injections administered - Next injections scheduled for 8/22 with StColletta [place]10/25 with me, and 12/22 with me - Call with any issues or questions  Mitchelle Goerner L. Deslyn Cavenaugh, PharmD, BCIDP, AAHIVP, CPShamrocklinical Pharmacist Practitioner [facility]

## 2021-02-20 LAB — RPR: RPR Ser Ql: REACTIVE — AB

## 2021-02-20 LAB — HIV-1 RNA QUANT-NO REFLEX-BLD
HIV 1 RNA Quant: NOT DETECTED Copies/mL
HIV-1 RNA Quant, Log: NOT DETECTED Log cps/mL

## 2021-02-20 LAB — RPR TITER: RPR Titer: 1:16 {titer} — ABNORMAL HIGH

## 2021-02-20 LAB — FLUORESCENT TREPONEMAL AB(FTA)-IGG-BLD: Fluorescent Treponemal ABS: REACTIVE — AB

## 2021-02-24 ENCOUNTER — Other Ambulatory Visit: Payer: Self-pay | Admitting: Family

## 2021-02-24 DIAGNOSIS — Z21 Asymptomatic human immunodeficiency virus [HIV] infection status: Secondary | ICD-10-CM

## 2021-03-31 ENCOUNTER — Other Ambulatory Visit: Payer: Self-pay

## 2021-04-12 ENCOUNTER — Other Ambulatory Visit (HOSPITAL_COMMUNITY): Payer: Self-pay

## 2021-04-13 ENCOUNTER — Other Ambulatory Visit (HOSPITAL_COMMUNITY): Payer: Self-pay

## 2021-04-14 ENCOUNTER — Other Ambulatory Visit (HOSPITAL_COMMUNITY): Payer: Self-pay

## 2021-04-15 ENCOUNTER — Telehealth: Payer: Self-pay

## 2021-04-15 NOTE — Telephone Encounter (Signed)
[facility] Patient Advocate Encounter  Patient's medication(Cabenuva) have been couriered to [facility] from [facility] and will be administered on patient next office visit on 04/19/21.  Krissi Willaims , CPhT Specialty Pharmacy Patient [facility] Phone: [phone] Fax:  [phone]  

## 2021-04-19 ENCOUNTER — Encounter: Payer: Medicaid Other | Admitting: Pharmacist

## 2021-04-20 ENCOUNTER — Encounter: Payer: Medicaid Other | Admitting: Pharmacist

## 2021-04-27 ENCOUNTER — Ambulatory Visit (INDEPENDENT_AMBULATORY_CARE_PROVIDER_SITE_OTHER): Payer: Medicaid Other | Admitting: Pharmacist

## 2021-04-27 ENCOUNTER — Other Ambulatory Visit: Payer: Self-pay

## 2021-04-27 ENCOUNTER — Other Ambulatory Visit (HOSPITAL_COMMUNITY)
Admission: RE | Admit: 2021-04-27 | Discharge: 2021-04-27 | Disposition: A | Discharge status: Home / Self Care | Payer: Medicaid Other | Source: Ambulatory Visit | Attending: Infectious Diseases | Admitting: Infectious Diseases

## 2021-04-27 DIAGNOSIS — Z23 Encounter for immunization: Secondary | ICD-10-CM | POA: Diagnosis not present

## 2021-04-27 DIAGNOSIS — Z21 Asymptomatic human immunodeficiency virus [HIV] infection status: Secondary | ICD-10-CM

## 2021-04-27 MED ORDER — CABOTEGRAVIR & RILPIVIRINE ER 600 & 900 MG/3ML IM SUER
1.0000 | Freq: Once | INTRAMUSCULAR | Status: AC
  Administered 2021-04-27: 1 via INTRAMUSCULAR

## 2021-04-27 NOTE — Progress Notes (Signed)
HPI: Gerald Lee is a 25 y.o. male who presents to the [facility] for [place] administration.  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   CABOTEGRAVIR & RILPIVIRINE ER (CABENUVA) 600 & 900 MG/3ML INJECTION    Inject 1 kit into the muscle every 2 (two) months.   CHOLECALCIFEROL 25 MCG (1000 UT) TABLET    Take by mouth.   DOXYCYCLINE (VIBRA-TABS) 100 MG TABLET    Take 1 tablet (100 mg total) by mouth 2 (two) times daily.   ENSURE (ENSURE)    Take 237 mLs by mouth 2 (two) times daily between meals.   LORATADINE (CLARITIN) 10 MG TABLET    Take 10 mg by mouth daily.   METHOCARBAMOL (ROBAXIN) 500 MG TABLET    Take 500 mg by mouth 3 (three) times daily.   VITAMIN D, ERGOCALCIFEROL, (DRISDOL) 1.25 MG (50000 UNIT) CAPS CAPSULE    Take 50,000 Units by mouth once a week.  Modified Medications   No medications on file  Discontinued Medications   No medications on file    Allergies: Allergies  Allergen Reactions   Ibuprofen Other (See Comments)    Per patient "can not take because it will shut down my kidneys"    Past Medical History: Past Medical History:  Diagnosis Date   Asthma    Headache(784.0)    HIV infection ([place])    Orthostatic hypotension     Social  History: Social History   Socioeconomic History   Marital status: Single    Spouse name: Not on file   Number of children: Not on file   Years of education: Not on file   Highest education level: Not on file  Occupational History   Not on file  Tobacco Use   Smoking status: Former    Packs/day: 0.10    Types: Cigarettes, Cigars    Quit date: 11/29/2019    Years since quitting: 1.4   Smokeless tobacco: Never   Tobacco comments:    "only when super stressed"  Vaping Use   Vaping Use: Some days  Substance and Sexual Activity   Alcohol use: Yes    Comment: occ; drink daily   Drug use: Yes    Frequency: 7.0 times per week    Types: Marijuana    Comment: occassionally   Sexual activity: Yes    Partners: Male    Birth control/protection: Condom    Comment: declined condoms 1/21  Other Topics Concern   Not on file  Social History Narrative   Not on file   Social Determinants of Health   Financial Resource Strain: Not on file  Food Insecurity: Not on file  Transportation Needs: Not on file  Physical Activity: Not on file  Stress: Not on file  Social Connections: Not on file  Labs: Lab Results  Component Value Date   HIV1RNAQUANT Not Detected 02/17/2021   HIV1RNAQUANT <50 08/26/2020   HIV1RNAQUANT <50 07/07/2020   CD4TABS 1,014 09/10/2019   CD4TABS 968 03/11/2019   CD4TABS 1,250 06/11/2018    RPR and STI Lab Results  Component Value Date   LABRPR REACTIVE (A) 02/17/2021   LABRPR REACTIVE (A) 12/10/2020   LABRPR REACTIVE (A) 10/16/2019   LABRPR REACTIVE (A) 09/10/2019   LABRPR REACTIVE (A) 03/11/2019   RPRTITER 1:16 (H) 02/17/2021   RPRTITER 1:16 (H) 12/10/2020   RPRTITER 1:128 (H) 10/16/2019   RPRTITER 1:16 (H) 09/10/2019   RPRTITER 1:8 (H) 03/11/2019    STI Results GC CT  12/10/2020 Negative Negative  12/10/2020 Negative Positive(A)  12/10/2020 Negative Positive(A)  10/16/2019 Negative Negative  05/11/2019 **POSITIVE**(A) **POSITIVE**(A)  03/11/2019  Negative Negative  01/06/2019 Negative Negative  10/01/2018 **POSITIVE**(A) Negative  06/11/2018 Negative Negative  06/11/2018 Negative Negative  06/11/2018 Negative Negative  02/16/2018 Negative Negative  10/13/2017 Negative Negative  09/11/2017 Negative Negative  05/22/2017 Negative **POSITIVE**(A)  05/22/2017 Negative Negative  05/17/2017 Negative Negative    Hepatitis B Lab Results  Component Value Date   HEPBSAB REACTIVE (A) 03/11/2019   HEPBSAG NON-REACTIVE 05/22/2017   Hepatitis C Lab Results  Component Value Date   HEPCAB NON-REACTIVE 05/22/2017   Hepatitis A Lab Results  Component Value Date   HAV REACTIVE (A) 05/22/2017   Lipids: Lab Results  Component Value Date   CHOL 109 10/30/2020   TRIG 43 10/30/2020   HDL 38 10/30/2020   CHOLHDL 3.4 03/11/2019   LDLCALC 62 10/30/2020    TARGET DATE:  The 22nd of the month  Current HIV Regimen: Cabenuva  Assessment: [name] presents today for their maintenance Cabenuva injections. Initial/past injections were tolerated well without issues. No problems with systemic effects of injections. Patient did experience body aches and overall fatigue at the end of his injection window but states he also has not been sleeping much this week which could certainly be a confounding factor.   Administered cabotegravir 612m/3mL in left upper outer quadrant of the gluteal muscle. Administered rilpivirine 900 mg/391min the right upper outer quadrant of the gluteal muscle. Monitored patient for 10 minutes after injection. Injections were tolerated well without issue. Patient will follow up in 2 months for next injection.  ThKoryas also interested in the monkeypox vaccine, so administered his first dose today. Will schedule follow-up in 28 days for 2nd shot. Patient also requested STI screening today so will check RPR and all cytologies. Of note, patient's RPR was 1:16 on 6/22 and was 1:128 in 2/21. Patient states he was treated for syphilis in  2021 and states he does not recall any symptoms of syphilis since then. Will repeat RPR today to assess for any decrease or consistency. If it has bumped up, will require treatment. Will also check HIV RNA today.   Plan: - Cabenuva injections administered - Next injections scheduled for 10/24 with StColletta Marylandnd 12/22 with me - Call with any issues or questions -Check HIV RNA, urine/oral/rectal cytologies, and RPR -Administer first monkeypox vaccine today -Follow-up for second monkeypox vaccine on 9/27 at 3:15pm  AmAlfonse SprucePharmD, CPP Clinical Pharmacist Practitioner Infectious [facility] Infectious Disease

## 2021-04-27 NOTE — Patient Instructions (Signed)
Dental Clinic Contact: Claris Che = [phone]

## 2021-04-28 ENCOUNTER — Other Ambulatory Visit: Payer: Self-pay

## 2021-04-28 ENCOUNTER — Ambulatory Visit (INDEPENDENT_AMBULATORY_CARE_PROVIDER_SITE_OTHER): Payer: Medicaid Other | Admitting: Pharmacist

## 2021-04-28 ENCOUNTER — Encounter: Payer: Self-pay | Admitting: Pharmacist

## 2021-04-28 ENCOUNTER — Other Ambulatory Visit (HOSPITAL_COMMUNITY): Payer: Self-pay

## 2021-04-28 ENCOUNTER — Telehealth: Payer: Self-pay

## 2021-04-28 VITALS — Wt 298.0 lb

## 2021-04-28 DIAGNOSIS — A549 Gonococcal infection, unspecified: Secondary | ICD-10-CM

## 2021-04-28 DIAGNOSIS — Z202 Contact with and (suspected) exposure to infections with a predominantly sexual mode of transmission: Secondary | ICD-10-CM

## 2021-04-28 LAB — CYTOLOGY, (ORAL, ANAL, URETHRAL) ANCILLARY ONLY
Chlamydia: NEGATIVE
Comment: NEGATIVE
Comment: NORMAL
Neisseria Gonorrhea: POSITIVE — AB

## 2021-04-28 LAB — URINE CYTOLOGY ANCILLARY ONLY
Chlamydia: NEGATIVE
Comment: NEGATIVE
Comment: NORMAL
Neisseria Gonorrhea: NEGATIVE

## 2021-04-28 MED ORDER — CEFTRIAXONE SODIUM 500 MG IJ SOLR
500.0000 mg | Freq: Once | INTRAMUSCULAR | Status: AC
  Administered 2021-04-28: 500 mg via INTRAMUSCULAR

## 2021-04-28 MED ORDER — DOXYCYCLINE HYCLATE 100 MG PO TABS
100.0000 mg | ORAL_TABLET | Freq: Two times a day (BID) | ORAL | 0 refills | Status: DC
  Filled 2021-04-28: qty 28, 14d supply, fill #0

## 2021-04-28 NOTE — Telephone Encounter (Signed)
-----   Message from Blanchard Kelch, NP sent at 04/28/2021  2:36 PM EDT ----- Urine + gonorrhea - he Needs 500 mg IM ceftriaxone in addition to the Doxy I sent in. He should hopefully have picked that up today and started it. I still mostly suspect the chlamydia history is causing his rectal issues.

## 2021-04-28 NOTE — Telephone Encounter (Signed)
Attempted to call patient with lab results and to schedule nurse visit for STD treatment. Not able to reach him at this time. Left voicemail requesting call back. Did not disclose results in message. Juanita Laster, RMA

## 2021-04-28 NOTE — Progress Notes (Signed)
   04/28/2021  HPI: JULIE PAOLINI is a 25 y.o. male who presents to the [facility] pharmacy clinic for monkeypox immunization.   Mr. Lacock was observed post immunization for 15 minutes without incident. He was provided with Vaccine Information Sheet and instruction to access the V-Safe system.   Mr. Mangino was instructed to call 911 with any severe reactions post vaccine: Difficulty breathing  Swelling of face and throat  A fast heartbeat  A bad rash all over body  Dizziness and weakness   [name], PharmD, CPP Clinical Pharmacist Practitioner Infectious Diseases Clinical Pharmacist [facility]  04/28/2021, 10:46 AM

## 2021-04-28 NOTE — Progress Notes (Signed)
Date:  04/28/2021   HPI: Gerald Lee is a 25 y.o. male who presents to the [place] clinic for HIV PrEP follow-up.  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   CABOTEGRAVIR & RILPIVIRINE ER (CABENUVA) 600 & 900 MG/3ML INJECTION    Inject 1 kit into the muscle every 2 (two) months.   CHOLECALCIFEROL 25 MCG (1000 UT) TABLET    Take by mouth.   DOXYCYCLINE (VIBRA-TABS) 100 MG TABLET    Take 1 tablet (100 mg total) by mouth 2 (two) times daily.   ENSURE (ENSURE)    Take 237 mLs by mouth 2 (two) times daily between meals.   LORATADINE (CLARITIN) 10 MG TABLET    Take 10 mg by mouth daily.   METHOCARBAMOL (ROBAXIN) 500 MG TABLET    Take 500 mg by mouth 3 (three) times daily.   VITAMIN D, ERGOCALCIFEROL, (DRISDOL) 1.25 MG (50000 UNIT) CAPS CAPSULE    Take 50,000 Units by mouth once a week.  Modified Medications   No medications on file  Discontinued Medications   No medications on file    Allergies: Allergies  Allergen Reactions   Ibuprofen Other (See Comments)    Per patient "can not take because it will shut down my kidneys"    Past Medical History: Past Medical History:  Diagnosis Date   Asthma    Headache(784.0)    HIV infection ([place])     Orthostatic hypotension     Social History: Social History   Socioeconomic History   Marital status: Single    Spouse name: Not on file   Number of children: Not on file   Years of education: Not on file   Highest education level: Not on file  Occupational History   Not on file  Tobacco Use   Smoking status: Former    Packs/day: 0.10    Types: Cigarettes, Cigars    Quit date: 11/29/2019    Years since quitting: 1.4   Smokeless tobacco: Never   Tobacco comments:    "only when super stressed"  Vaping Use   Vaping Use: Some days  Substance and Sexual Activity   Alcohol use: Yes    Comment: occ; drink daily   Drug use: Yes    Frequency: 7.0 times per week    Types: Marijuana    Comment: occassionally   Sexual activity: Yes    Partners: Male    Birth control/protection: Condom    Comment: declined condoms 1/21  Other Topics Concern   Not on file  Social History Narrative   Not on file   Social Determinants of Health   Financial Resource Strain: Not on file  Food Insecurity: Not on file  Transportation Needs: Not on file  Physical  Activity: Not on file  Stress: Not on file  Social Connections: Not on file    No flowsheet data found.  Labs:  SCr: Lab Results  Component Value Date   CREATININE 1.1 10/30/2020   CREATININE 1.17 03/11/2019   CREATININE 1.13 01/06/2019   CREATININE 1.40 (H) 02/16/2018   CREATININE 1.06 01/03/2018   HIV Lab Results  Component Value Date   HIV Reactive (A) 05/17/2017   HIV Non Reactive 07/28/2015   Hepatitis B Lab Results  Component Value Date   HEPBSAB REACTIVE (A) 03/11/2019   HEPBSAG NON-REACTIVE 05/22/2017   Hepatitis C Lab Results  Component Value Date   HEPCAB NON-REACTIVE 05/22/2017   Hepatitis A Lab Results  Component Value Date   HAV REACTIVE (A) 05/22/2017   RPR and STI Lab Results  Component Value Date   LABRPR REACTIVE (A) 04/27/2021   LABRPR REACTIVE (A) 02/17/2021   LABRPR REACTIVE (A)  12/10/2020   LABRPR REACTIVE (A) 10/16/2019   LABRPR REACTIVE (A) 09/10/2019   RPRTITER 1:2 (H) 04/27/2021   RPRTITER 1:16 (H) 02/17/2021   RPRTITER 1:16 (H) 12/10/2020   RPRTITER 1:128 (H) 10/16/2019   RPRTITER 1:16 (H) 09/10/2019    STI Results GC CT  04/27/2021 Negative Negative  04/27/2021 Positive(A) Negative  12/10/2020 Negative Negative  12/10/2020 Negative Positive(A)  12/10/2020 Negative Positive(A)  10/16/2019 Negative Negative  05/11/2019 **POSITIVE**(A) **POSITIVE**(A)  03/11/2019 Negative Negative  01/06/2019 Negative Negative  10/01/2018 **POSITIVE**(A) Negative  06/11/2018 Negative Negative  06/11/2018 Negative Negative  06/11/2018 Negative Negative  02/16/2018 Negative Negative  10/13/2017 Negative Negative  09/11/2017 Negative Negative  05/22/2017 Negative **POSITIVE**(A)  05/22/2017 Negative Negative  05/17/2017 Negative Negative    Assessment: Kullen presents to clinic today for gonorrhea treatment. Will administer ceftriaxone 500 mg (weight < 150kg) for positive oral gonorrhea. Urine cytology negative and rectal cytology results still pending. Encouraged patient to abstain from sex for 10 days; patient states he plans to not be active for longer.   Plan: Administer ceftriaxone 534m x1  AAlfonse Spruce PharmD, CPP Clinical Pharmacist Practitioner Infectious Diseases [facility] 04/28/2021, 4:03 PM

## 2021-04-29 LAB — FLUORESCENT TREPONEMAL AB(FTA)-IGG-BLD: Fluorescent Treponemal ABS: REACTIVE — AB

## 2021-04-29 LAB — CYTOLOGY, (ORAL, ANAL, URETHRAL) ANCILLARY ONLY
Chlamydia: NEGATIVE
Comment: NEGATIVE
Comment: NORMAL
Neisseria Gonorrhea: POSITIVE — AB

## 2021-04-29 LAB — RPR TITER: RPR Titer: 1:2 {titer} — ABNORMAL HIGH

## 2021-04-29 LAB — HIV-1 RNA QUANT-NO REFLEX-BLD
HIV 1 RNA Quant: NOT DETECTED Copies/mL
HIV-1 RNA Quant, Log: NOT DETECTED Log cps/mL

## 2021-04-29 LAB — RPR: RPR Ser Ql: REACTIVE — AB

## 2021-04-30 ENCOUNTER — Encounter: Payer: Self-pay | Admitting: Physician Assistant

## 2021-04-30 ENCOUNTER — Other Ambulatory Visit: Payer: Self-pay

## 2021-04-30 ENCOUNTER — Ambulatory Visit (INDEPENDENT_AMBULATORY_CARE_PROVIDER_SITE_OTHER): Payer: Medicaid Other | Admitting: Physician Assistant

## 2021-04-30 VITALS — BP 122/75 | HR 78 | Temp 97.8°F | Wt 277.0 lb

## 2021-04-30 DIAGNOSIS — R21 Rash and other nonspecific skin eruption: Secondary | ICD-10-CM

## 2021-04-30 DIAGNOSIS — K6289 Other specified diseases of anus and rectum: Secondary | ICD-10-CM

## 2021-04-30 NOTE — Patient Instructions (Signed)
Swab for monkeypox sent to lab, I will mychart message you with results ASAP Continue to isolate at home and disinfect as you are already doing You may add docusate to soften stool with miralax and increase water and fruits and vegetables to help soft stool

## 2021-04-30 NOTE — Progress Notes (Signed)
S  Subjective:    Patient ID: Gerald Lee, male    DOB: Aug 21, 1996, 25 y.o.   MRN: [id number]  Chief Complaint  Patient presents with   lesion on palm of right hand concern for MPX     HPI:  Gerald Lee is a 25 y.o. male HIV positive-well controlled virus taking Cabanuva, tolerating well.  Presented to clinic 04/27/2021 for Cabanuva injection he was also treated for gonorrhea and chlamydia with ceftriaxone 500 IM and doxy bid x 7 days, as well as first MPX vaccine injection with Jynneos.  That day he had rectal pain, swelling and pain with BM. He also observed a lesion on right hand.  Lesion initially presented like a "pimple" and has become pustular with red margins.  Painless.  Since treatment his rectal pain has improved and BMs are less painful with addition of Miralax, but not resolved.  He denies any anorectal lesions. He is MSM, last sexual activity was 04/24/21 anal insertive and receptive intercourse, condomless and not anonymous.  He denies any flu like symptoms prior to onset.         Allergies  Allergen Reactions   Ibuprofen Other (See Comments)    Per patient "can not take because it will shut down my kidneys"      Outpatient Medications Prior to Visit  Medication Sig Dispense Refill   acetaminophen (TYLENOL) 325 MG tablet Take by mouth.     cabotegravir & rilpivirine ER (CABENUVA) 600 & 900 MG/3ML injection Inject 1 kit into the muscle every 2 (two) months. 6 mL 5   Cholecalciferol 25 MCG (1000 UT) tablet Take by mouth.     doxycycline (VIBRA-TABS) 100 MG tablet Take 1 tablet (100 mg total) by mouth 2 (two) times daily. 28 tablet 0   Ensure (ENSURE) Take 237 mLs by mouth 2 (two) times daily between meals. 237 mL 5   loratadine (CLARITIN) 10 MG tablet Take 10 mg by mouth daily.     methocarbamol (ROBAXIN) 500 MG tablet Take 500 mg by mouth 3 (three) times daily.     Vitamin D, Ergocalciferol, (DRISDOL) 1.25 MG (50000 UNIT) CAPS capsule Take 50,000 Units by mouth  once a week.     No facility-administered medications prior to visit.     Past Medical History:  Diagnosis Date   Asthma    Headache(784.0)    HIV infection ([place])    Orthostatic hypotension      Past Surgical History:  Procedure Laterality Date   CIRCUMCISION  1998   HERNIA REPAIR     Done between the ages of 29 or 8 years    NO PAST SURGERIES         Review of Systems  Constitutional:  Negative for appetite change, chills and fever.  HENT:  Negative for mouth sores and sore throat.   Respiratory: Negative.    Cardiovascular: Negative.   Gastrointestinal:  Positive for constipation and rectal pain. Negative for abdominal pain, anal bleeding and blood in stool.  Genitourinary: Negative.   Neurological: Negative.   Hematological:  Negative for adenopathy.     Objective:    BP 122/75   Pulse 78   Temp 97.8 F (36.6 C) (Oral)   Wt 277 lb (125.6 kg)   BMI 33.72 kg/m  Nursing note and vital signs reviewed.  Physical Exam Vitals and nursing note reviewed.  Constitutional:      Appearance: Normal appearance.  HENT:     Head: Normocephalic and atraumatic.  Cardiovascular:     Rate and Rhythm: Normal rate and regular rhythm.  Pulmonary:     Effort: Pulmonary effort is normal.     Breath sounds: Normal breath sounds.  Skin:    Findings: Lesion present.          Comments: Single pustular lesion located on right palm between first and 2nd digit, deep seated, erythematous margins, nttp, no signs of secondary infection exudate or discharge  Neurological:     Mental Status: He is alert.  Psychiatric:        Mood and Affect: Mood normal.        Behavior: Behavior normal.        Thought Content: Thought content normal.        Judgment: Judgment normal.     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   History of syphilis 07/11/2017   Moderate episode of recurrent major depressive disorder ([place]) 06/21/2017   Asymptomatic HIV infection ([place]) 05/22/2017   Healthcare maintenance 05/22/2017   Migraine without aura 03/14/2014   Obesity, unspecified 03/14/2014   Insomnia, unspecified 03/14/2014     Problem List Items Addressed This Visit   None Visit Diagnoses     Proctitis    -  Primary   Relevant Orders   Monkeypox Virus DNA, Qualitative Real-Time PCR   Rash and nonspecific skin eruption       Relevant Orders   Monkeypox Virus DNA, Qualitative Real-Time PCR        I am having Gerald Lee "Gerald Lee" maintain his methocarbamol, loratadine, Ensure, Cholecalciferol, acetaminophen, Vitamin D (Ergocalciferol), Cabenuva, and doxycycline.   No orders of the defined types were placed in this encounter.    Follow-up: Return if symptoms worsen or fail to improve.

## 2021-05-03 LAB — MONKEYPOX VIRUS DNA, QUALITATIVE REAL-TIME PCR
MONKEYPOX VIRUS DNA, QL PCR: NOT DETECTED
Orthopoxvirus DNA, QL PCR: DETECTED — CR

## 2021-05-25 ENCOUNTER — Ambulatory Visit: Payer: Medicaid Other | Admitting: Pharmacist

## 2021-05-27 ENCOUNTER — Ambulatory Visit (INDEPENDENT_AMBULATORY_CARE_PROVIDER_SITE_OTHER): Payer: Medicaid Other

## 2021-05-27 ENCOUNTER — Other Ambulatory Visit: Payer: Self-pay

## 2021-05-27 DIAGNOSIS — Z23 Encounter for immunization: Secondary | ICD-10-CM | POA: Diagnosis not present

## 2021-05-27 NOTE — Progress Notes (Signed)
    [facility]  Name:  JAYLAND NULL    MRN: [id number] DOB: 1996/01/15   05/27/2021  Mr. Smaltz was observed post JYNNEOS immunization for 15 minutes without incident. He was provided with Vaccine Information Sheet and instruction to access the V-Safe system.   Mr. Harriott was instructed to call 911 with any severe reactions post vaccine: Difficulty breathing  Swelling of face and throat  A fast heartbeat  A bad rash all over body  Dizziness and weakness    Danie Diehl T Pricilla Loveless

## 2021-06-11 ENCOUNTER — Other Ambulatory Visit (HOSPITAL_COMMUNITY): Payer: Self-pay

## 2021-06-14 ENCOUNTER — Telehealth: Payer: Self-pay

## 2021-06-14 NOTE — Telephone Encounter (Signed)
[facility] Patient Advocate Encounter  Patient's medication (Cabenuva)have been couriered to [facility] from [facility] and will be administered on patient next office visit on 06/21/21.  Jsiah Menta , CPhT [facility]ty Pharmacy Patient Advocate [facility] Phone: [phone] Fax:  [phone]  

## 2021-06-21 ENCOUNTER — Encounter: Payer: Medicaid Other | Admitting: Infectious Diseases

## 2021-06-23 ENCOUNTER — Encounter: Payer: Self-pay | Admitting: Infectious Diseases

## 2021-06-23 ENCOUNTER — Ambulatory Visit (INDEPENDENT_AMBULATORY_CARE_PROVIDER_SITE_OTHER): Payer: Medicaid Other | Admitting: Infectious Diseases

## 2021-06-23 ENCOUNTER — Other Ambulatory Visit: Payer: Self-pay

## 2021-06-23 VITALS — Wt 284.0 lb

## 2021-06-23 DIAGNOSIS — Z23 Encounter for immunization: Secondary | ICD-10-CM

## 2021-06-23 DIAGNOSIS — Z21 Asymptomatic human immunodeficiency virus [HIV] infection status: Secondary | ICD-10-CM | POA: Diagnosis not present

## 2021-06-23 MED ORDER — CABOTEGRAVIR & RILPIVIRINE ER 600 & 900 MG/3ML IM SUER
1.0000 | Freq: Once | INTRAMUSCULAR | Status: AC
  Administered 2021-06-23: 1 via INTRAMUSCULAR

## 2021-06-23 NOTE — Assessment & Plan Note (Signed)
He has been on cabenuva injections for well over 1 year now with serial not detected viral loads. I think we can space out check to every 4-6 months for him. Politely declined STI screen today. Has condoms available to him.   Cabenuva injection was administered today - will continue q43m.   Flu vaccine today.   Last VL result:  HIV 1 RNA Quant (Copies/mL)  Date Value  04/27/2021 Not Detected    Dose Interval: Q110m Next Appointment: 08/19/2021

## 2021-06-23 NOTE — Addendum Note (Signed)
Addended by: Rosanna Randy on: 06/23/2021 03:52 PM   Modules accepted: Orders

## 2021-06-23 NOTE — Patient Instructions (Signed)
We gave you your injection of CABENUVA for treatment today.   Your next appointment has been scheduled in 2 month on 08/19/2021    Helpful Tips:  If you experience any knots under the skin please use a warm compress to help.    Some people experience some redness at the site of the injection. This can be normal and should go away soon.   Moving around today is a good idea, if you sit too much it hurts more.   Please call the office to speak with our pharmacy or triage team if you have any questions or concerns.

## 2021-06-23 NOTE — Progress Notes (Signed)
Name: Gerald Lee  DOB: [birth year]  MRN: [id number] PCP: [name], MD    Brief Narrative:  Feliberto is a 25 y.o. AA male with well controlled HIV, Dx 04-2017 with CD4 nadir 780.  Hep B sAg (-) High Risk: MSM.  Previous OIs: none  Previous Regimens:  Biktarvy 2018 --> suppressed   Genotype:  02/2017 - no mutations   Subjective:   CC: HIV follow up care, cabenuva injection.    HPI:  Doing well without complaints today. Tired from a weekend spending family time up [place].   Rectal symptoms resolved within 1-2 weeks with human monkeypox infection. No other lesions came up after the one on his hand.    Review of Systems  Constitutional:  Negative for chills and fever.  HENT:  Negative for tinnitus.   Eyes:  Negative for blurred vision and photophobia.  Respiratory:  Negative for cough and sputum production.   Cardiovascular:  Negative for chest pain.  Gastrointestinal:  Negative for diarrhea, nausea and vomiting.  Genitourinary:  Negative for dysuria.  Skin:  Negative for rash.  Neurological:  Negative for headaches.   Past Medical History:  Diagnosis Date   Asthma    Headache(784.0)    HIV infection ([facility])    Orthostatic hypotension     Social History   Tobacco Use   Smoking status: Former    Packs/day: 0.10    Types: Cigarettes, Cigars    Quit date: 11/29/2019    Years since quitting: 1.5   Smokeless tobacco: Never   Tobacco comments:    "only when super stressed"  Vaping Use   Vaping Use: Some days  Substance Use Topics   Alcohol use: Yes    Comment: occ; drink daily   Drug use: Yes    Frequency: 7.0 times per week    Types: Marijuana    Comment: occassionally    Allergies  Allergen Reactions   Ibuprofen Other (See Comments)    Per patient "can not take because it will shut down my kidneys"    Objective:  Vitals:   06/23/21 1517  Weight: 284 lb (128.8 kg)   Body mass index is 34.57 kg/m.  Physical Exam Vitals reviewed.   Constitutional:      Appearance: He is well-developed.     Comments: Seated comfortably in chair during visit.   HENT:     Mouth/Throat:     Dentition: Normal dentition. No dental abscesses.  Cardiovascular:     Rate and Rhythm: Normal rate and regular rhythm.     Heart sounds: Normal heart sounds.  Pulmonary:     Effort: Pulmonary effort is normal.     Breath sounds: Normal breath sounds.     Comments: No shortness of breath detected in conversation.  Abdominal:     General: There is no distension.     Palpations: Abdomen is soft.     Tenderness: There is no abdominal tenderness.  Lymphadenopathy:     Cervical: No cervical adenopathy.  Skin:    General: Skin is warm and dry.     Findings: No rash.  Neurological:     Mental Status: He is alert and oriented to person, place, and time.  Psychiatric:        Mood and Affect: Mood normal.        Behavior: Behavior normal.        Thought Content: Thought content normal.        Judgment: Judgment normal.  Comments: In good spirits today and engaged in care discussion.      Lab Results Lab Results  Component Value Date   WBC 7.7 10/30/2020   HGB 13.6 10/30/2020   HCT 41 10/30/2020   MCV 87.1 03/11/2019   PLT 226 10/30/2020    Lab Results  Component Value Date   CREATININE 1.1 10/30/2020   BUN 12 10/30/2020   NA 138 10/30/2020   K 4.1 10/30/2020   CL 101 10/30/2020   CO2 24 (A) 10/30/2020    Lab Results  Component Value Date   ALT 17 10/30/2020   AST 18 10/30/2020   ALKPHOS 123 10/30/2020   BILITOT 0.5 03/11/2019    Lab Results  Component Value Date   CHOL 109 10/30/2020   HDL 38 10/30/2020   LDLCALC 62 10/30/2020   TRIG 43 10/30/2020   CHOLHDL 3.4 03/11/2019   HIV 1 RNA Quant  Date Value  04/27/2021 Not Detected Copies/mL  02/17/2021 Not Detected Copies/mL  08/26/2020 <50   CD4 T Cell Abs (/uL)  Date Value  09/10/2019 1,014  03/11/2019 968  06/11/2018 1,250   Lab Results  Component Value  Date   HAV REACTIVE (A) 05/22/2017   Lab Results  Component Value Date   HEPBSAG NON-REACTIVE 05/22/2017   HEPBSAB REACTIVE (A) 03/11/2019    Problem List Items Addressed This Visit       Unprioritized   Asymptomatic HIV infection ([facility])    He has been on cabenuva injections for well over 1 year now with serial not detected viral loads. I think we can space out check to every 4-6 months for him. Politely declined STI screen today. Has condoms available to him.   Cabenuva injection was administered today - will continue q14m.   Flu vaccine today.   Last VL result:  HIV 1 RNA Quant (Copies/mL)  Date Value  04/27/2021 Not Detected    Dose Interval: Q66m Next Appointment: 08/19/2021         Rexene Alberts, MSN, NP-C [facility]  [email or web address] Pager: [phone] Office: [phone] [facility] Main Line: [phone]   06/23/21

## 2021-08-04 ENCOUNTER — Other Ambulatory Visit: Payer: Self-pay

## 2021-08-09 ENCOUNTER — Ambulatory Visit (HOSPITAL_COMMUNITY)
Admission: EM | Admit: 2021-08-09 | Discharge: 2021-08-09 | Disposition: A | Discharge status: Home / Self Care | Payer: Medicaid Other | Attending: Sports Medicine | Admitting: Sports Medicine

## 2021-08-09 ENCOUNTER — Encounter (HOSPITAL_COMMUNITY): Payer: Self-pay

## 2021-08-09 ENCOUNTER — Other Ambulatory Visit: Payer: Self-pay

## 2021-08-09 ENCOUNTER — Ambulatory Visit (INDEPENDENT_AMBULATORY_CARE_PROVIDER_SITE_OTHER): Payer: Medicaid Other

## 2021-08-09 DIAGNOSIS — M545 Low back pain, unspecified: Secondary | ICD-10-CM | POA: Diagnosis not present

## 2021-08-09 DIAGNOSIS — G8929 Other chronic pain: Secondary | ICD-10-CM

## 2021-08-09 DIAGNOSIS — M5442 Lumbago with sciatica, left side: Secondary | ICD-10-CM

## 2021-08-09 MED ORDER — KETOROLAC TROMETHAMINE 30 MG/ML IJ SOLN
30.0000 mg | Freq: Once | INTRAMUSCULAR | Status: AC
  Administered 2021-08-09: 30 mg via INTRAMUSCULAR

## 2021-08-09 MED ORDER — KETOROLAC TROMETHAMINE 30 MG/ML IJ SOLN
INTRAMUSCULAR | Status: AC
  Filled 2021-08-09: qty 1
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
## 2021-08-09 NOTE — Discharge Instructions (Addendum)
Ice or heat to painful area Begin low back stretches and at home exercises Follow-up with your PCP on Monday, I did provide information in the chart to local orthopedic office if your pain is not better by the time you see your primary care physician Tylenol as needed

## 2021-08-09 NOTE — ED Triage Notes (Signed)
Pt presented to the urgent care for back pain x 3 days.

## 2021-08-10 ENCOUNTER — Other Ambulatory Visit (HOSPITAL_COMMUNITY): Payer: Self-pay

## 2021-08-11 ENCOUNTER — Other Ambulatory Visit (HOSPITAL_COMMUNITY): Payer: Self-pay

## 2021-08-12 ENCOUNTER — Telehealth: Payer: Self-pay

## 2021-08-12 NOTE — Telephone Encounter (Signed)
[facility] Patient Advocate Encounter  Patient's medication Renaldo Harrison) have been couriered to [facility] from [employer] and will be administered on the patient next office visit on 08/19/21.  [name] , CPhT Specialty Pharmacy Patient [facility] for Infectious Disease Phone: [phone] Fax:  [phone]

## 2021-08-16 DIAGNOSIS — Z0000 Encounter for general adult medical examination without abnormal findings: Secondary | ICD-10-CM | POA: Diagnosis not present

## 2021-08-16 DIAGNOSIS — J4520 Mild intermittent asthma, uncomplicated: Secondary | ICD-10-CM | POA: Diagnosis not present

## 2021-08-16 DIAGNOSIS — Z21 Asymptomatic human immunodeficiency virus [HIV] infection status: Secondary | ICD-10-CM | POA: Diagnosis not present

## 2021-08-16 DIAGNOSIS — Z13220 Encounter for screening for lipoid disorders: Secondary | ICD-10-CM | POA: Diagnosis not present

## 2021-08-16 DIAGNOSIS — E559 Vitamin D deficiency, unspecified: Secondary | ICD-10-CM | POA: Diagnosis not present

## 2021-08-16 DIAGNOSIS — M545 Low back pain, unspecified: Secondary | ICD-10-CM | POA: Diagnosis not present

## 2021-08-16 DIAGNOSIS — Z131 Encounter for screening for diabetes mellitus: Secondary | ICD-10-CM | POA: Diagnosis not present

## 2021-08-17 DIAGNOSIS — M545 Low back pain, unspecified: Secondary | ICD-10-CM | POA: Diagnosis not present

## 2021-08-19 ENCOUNTER — Other Ambulatory Visit: Payer: Self-pay

## 2021-08-19 ENCOUNTER — Ambulatory Visit (INDEPENDENT_AMBULATORY_CARE_PROVIDER_SITE_OTHER): Payer: Medicaid Other | Admitting: Pharmacist

## 2021-08-19 DIAGNOSIS — Z79899 Other long term (current) drug therapy: Secondary | ICD-10-CM

## 2021-08-19 DIAGNOSIS — B20 Human immunodeficiency virus [HIV] disease: Secondary | ICD-10-CM | POA: Diagnosis not present

## 2021-08-19 MED ORDER — CABOTEGRAVIR & RILPIVIRINE ER 600 & 900 MG/3ML IM SUER
1.0000 | Freq: Once | INTRAMUSCULAR | Status: AC
  Administered 2021-08-19: 15:00:00 1 via INTRAMUSCULAR

## 2021-08-19 NOTE — Progress Notes (Signed)
HPI: Gerald Lee is a 25 y.o. male who presents to the [facility] clinic for [place] administration.  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   CABOTEGRAVIR & RILPIVIRINE ER (CABENUVA) 600 & 900 MG/3ML INJECTION    Inject 1 kit into the muscle every 2 (two) months.   CHOLECALCIFEROL 25 MCG (1000 UT) TABLET    Take by mouth.   DOXYCYCLINE (VIBRA-TABS) 100 MG TABLET    Take 1 tablet (100 mg total) by mouth 2 (two) times daily.   ENSURE (ENSURE)    Take 237 mLs by mouth 2 (two) times daily between meals.   LORATADINE (CLARITIN) 10 MG TABLET    Take 10 mg by mouth daily.   METHOCARBAMOL (ROBAXIN) 500 MG TABLET    Take 500 mg by mouth 3 (three) times daily.   VITAMIN D, ERGOCALCIFEROL, (DRISDOL) 1.25 MG (50000 UNIT) CAPS CAPSULE    Take 50,000 Units by mouth once a week.  Modified Medications   No medications on file  Discontinued Medications   No medications on file    Allergies: Allergies  Allergen Reactions   Ibuprofen Other (See Comments)    Per patient "can not take because it will shut down my kidneys"    Past Medical History: Past Medical History:  Diagnosis Date   Asthma    Headache(784.0)    HIV infection ([place])    Orthostatic hypotension     Social  History: Social History   Socioeconomic History   Marital status: Single    Spouse name: Not on file   Number of children: Not on file   Years of education: Not on file   Highest education level: Not on file  Occupational History   Not on file  Tobacco Use   Smoking status: Former    Packs/day: 0.10    Types: Cigarettes, Cigars    Quit date: 11/29/2019    Years since quitting: 1.7   Smokeless tobacco: Never   Tobacco comments:    "only when super stressed"  Vaping Use   Vaping Use: Some days  Substance and Sexual Activity   Alcohol use: Yes    Comment: occ; drink daily   Drug use: Yes    Frequency: 7.0 times per week    Types: Marijuana    Comment: occassionally   Sexual activity: Yes    Partners: Male    Birth control/protection: Condom    Comment: declined condoms 1/21  Other Topics Concern   Not on file  Social History Narrative   Not on file   Social Determinants of Health   Financial Resource Strain: Not on file  Food Insecurity: Not on file  Transportation Needs: Not on file  Physical Activity: Not on file  Stress: Not on file  Social Connections: Not on file  Labs: Lab Results  Component Value Date   HIV1RNAQUANT Not Detected 04/27/2021   HIV1RNAQUANT Not Detected 02/17/2021   HIV1RNAQUANT <50 08/26/2020   CD4TABS 1,014 09/10/2019   CD4TABS 968 03/11/2019   CD4TABS 1,250 06/11/2018    RPR and STI Lab Results  Component Value Date   LABRPR REACTIVE (A) 04/27/2021   LABRPR REACTIVE (A) 02/17/2021   LABRPR REACTIVE (A) 12/10/2020   LABRPR REACTIVE (A) 10/16/2019   LABRPR REACTIVE (A) 09/10/2019   RPRTITER 1:2 (H) 04/27/2021   RPRTITER 1:16 (H) 02/17/2021   RPRTITER 1:16 (H) 12/10/2020   RPRTITER 1:128 (H) 10/16/2019   RPRTITER 1:16 (H) 09/10/2019    STI Results GC CT  04/27/2021 Positive(A) Negative  04/27/2021 Negative Negative  04/27/2021 Positive(A) Negative  12/10/2020 Negative Negative  12/10/2020 Negative Positive(A)  12/10/2020  Negative Positive(A)  10/16/2019 Negative Negative  05/11/2019 **POSITIVE**(A) **POSITIVE**(A)  03/11/2019 Negative Negative  01/06/2019 Negative Negative  10/01/2018 **POSITIVE**(A) Negative  06/11/2018 Negative Negative  06/11/2018 Negative Negative  06/11/2018 Negative Negative  02/16/2018 Negative Negative  10/13/2017 Negative Negative  09/11/2017 Negative Negative  05/22/2017 Negative **POSITIVE**(A)  05/22/2017 Negative Negative  05/17/2017 Negative Negative    Hepatitis B Lab Results  Component Value Date   HEPBSAB REACTIVE (A) 03/11/2019   HEPBSAG NON-REACTIVE 05/22/2017   Hepatitis C Lab Results  Component Value Date   HEPCAB NON-REACTIVE 05/22/2017   Hepatitis A Lab Results  Component Value Date   HAV REACTIVE (A) 05/22/2017   Lipids: Lab Results  Component Value Date   CHOL 109 10/30/2020   TRIG 43 10/30/2020   HDL 38 10/30/2020   CHOLHDL 3.4 03/11/2019   LDLCALC 62 10/30/2020    TARGET DATE:  The 22nd of the month  Current HIV Regimen: Cabenuva  Assessment: Williard presents today for their maintenance Cabenuva injections. Initial/past injections were tolerated well without issues. No problems with systemic effects of injections.   Administered cabotegravir 62m/3mL in left upper outer quadrant of the gluteal muscle. Administered rilpivirine 900 mg/369min the right upper outer quadrant of the gluteal muscle. Monitored patient for 10 minutes after injection. Injections were tolerated well without issue. Patient will follow up in 2 months for next injection.  Plan: - Cabenuva injections administered - Next injections scheduled for 10/20/21 with me, 12/10/21 with [facility] [place] 02/17/22 with me - Call with any issues or questions  AmAlfonse SprucePharmD, CPP Clinical Pharmacist Practitioner [facility]

## 2021-08-20 LAB — T-HELPER CELLS (CD4) COUNT (NOT AT ARMC)
CD4 % Helper T Cell: 31 % — ABNORMAL LOW (ref 33–65)
CD4 T Cell Abs: 916 /uL (ref 400–1790)

## 2021-08-22 LAB — HIV-1 RNA QUANT-NO REFLEX-BLD
HIV 1 RNA Quant: NOT DETECTED Copies/mL
HIV-1 RNA Quant, Log: NOT DETECTED Log cps/mL

## 2021-09-01 DIAGNOSIS — M545 Low back pain, unspecified: Secondary | ICD-10-CM | POA: Diagnosis not present

## 2021-09-03 DIAGNOSIS — M545 Low back pain, unspecified: Secondary | ICD-10-CM | POA: Diagnosis not present

## 2021-09-21 ENCOUNTER — Emergency Department (HOSPITAL_COMMUNITY)
Admission: EM | Admit: 2021-09-21 | Discharge: 2021-09-21 | Disposition: A | Discharge status: Home / Self Care | Payer: Medicaid Other | Attending: Emergency Medicine | Admitting: Emergency Medicine

## 2021-09-21 ENCOUNTER — Encounter (HOSPITAL_COMMUNITY): Payer: Self-pay | Admitting: Emergency Medicine

## 2021-09-21 ENCOUNTER — Emergency Department (HOSPITAL_COMMUNITY): Payer: Medicaid Other

## 2021-09-21 ENCOUNTER — Other Ambulatory Visit: Payer: Self-pay

## 2021-09-21 DIAGNOSIS — Z20822 Contact with and (suspected) exposure to covid-19: Secondary | ICD-10-CM | POA: Diagnosis not present

## 2021-09-21 DIAGNOSIS — R0602 Shortness of breath: Secondary | ICD-10-CM | POA: Diagnosis not present

## 2021-09-21 DIAGNOSIS — J101 Influenza due to other identified influenza virus with other respiratory manifestations: Secondary | ICD-10-CM | POA: Insufficient documentation

## 2021-09-21 DIAGNOSIS — R059 Cough, unspecified: Secondary | ICD-10-CM | POA: Diagnosis not present

## 2021-09-21 DIAGNOSIS — R051 Acute cough: Secondary | ICD-10-CM

## 2021-09-21 HISTORY — DX: Asymptomatic human immunodeficiency virus (hiv) infection status: Z21

## 2021-09-21 HISTORY — DX: Human immunodeficiency virus (HIV) disease: B20

## 2021-09-21 LAB — BASIC METABOLIC PANEL
Anion gap: 7 (ref 5–15)
BUN: 10 mg/dL (ref 6–20)
CO2: 25 mmol/L (ref 22–32)
Calcium: 8.9 mg/dL (ref 8.9–10.3)
Chloride: 103 mmol/L (ref 98–111)
Creatinine, Ser: 1.28 mg/dL — ABNORMAL HIGH (ref 0.61–1.24)
GFR, Estimated: >60 mL/min (ref >60)
Glucose, Bld: 79 mg/dL (ref 70–99)
Potassium: 4.1 mmol/L (ref 3.5–5.1)
Sodium: 135 mmol/L (ref 135–145)

## 2021-09-21 LAB — CBC WITH DIFFERENTIAL/PLATELET
Abs Immature Granulocytes: 0.03 10*3/uL (ref 0.00–0.07)
Basophils Absolute: 0 10*3/uL (ref 0.0–0.1)
Basophils Relative: 0 %
Eosinophils Absolute: 0.1 10*3/uL (ref 0.0–0.5)
Eosinophils Relative: 1 %
HCT: 46 % (ref 39.0–52.0)
Hemoglobin: 14.7 g/dL (ref 13.0–17.0)
Immature Granulocytes: 0 %
Lymphocytes Relative: 27 %
Lymphs Abs: 2.5 10*3/uL (ref 0.7–4.0)
MCH: 28.5 pg (ref 26.0–34.0)
MCHC: 32 g/dL (ref 30.0–36.0)
MCV: 89.1 fL (ref 80.0–100.0)
Monocytes Absolute: 0.8 10*3/uL (ref 0.1–1.0)
Monocytes Relative: 8 %
Neutro Abs: 6.1 10*3/uL (ref 1.7–7.7)
Neutrophils Relative %: 64 %
Platelets: 244 10*3/uL (ref 150–400)
RBC: 5.16 MIL/uL (ref 4.22–5.81)
RDW: 12.9 % (ref 11.5–15.5)
WBC: 9.5 10*3/uL (ref 4.0–10.5)
nRBC: 0 % (ref 0.0–0.2)

## 2021-09-21 LAB — RESP PANEL BY RT-PCR (FLU A&B, COVID) ARPGX2
Influenza A by PCR: POSITIVE — AB
Influenza B by PCR: NEGATIVE
SARS Coronavirus 2 by RT PCR: NEGATIVE

## 2021-09-21 LAB — D-DIMER, QUANTITATIVE: D-Dimer, Quant: 0.33 ug/mL-FEU (ref 0.00–0.50)

## 2021-09-21 MED ORDER — OSELTAMIVIR PHOSPHATE 75 MG PO CAPS: 75.0000 mg | ORAL_CAPSULE | Freq: Two times a day (BID) | ORAL | 0 refills | Status: DC

## 2021-09-21 MED ORDER — ALBUTEROL SULFATE HFA 108 (90 BASE) MCG/ACT IN AERS
2.0000 | INHALATION_SPRAY | Freq: Once | RESPIRATORY_TRACT | Status: AC
  Administered 2021-09-21: 09:00:00 2 via RESPIRATORY_TRACT
  Filled 2021-09-21: qty 6.7

## 2021-09-21 NOTE — ED Triage Notes (Signed)
Pt. Stated, Gerald Lee had a cough and went to SOB for 2 days.

## 2021-09-21 NOTE — ED Provider Notes (Signed)
[facility] EMERGENCY DEPARTMENT Provider Note   CSN: [id number] Arrival date & time: 09/21/21  0631     History  Chief Complaint  Patient presents with   Cough   Shortness of Breath    Gerald Lee is a 26 y.o. male.   Cough Associated symptoms: shortness of breath   Shortness of Breath Associated symptoms: cough    26 year old male presented to the emergency department with a cough and shortness of breath for the past 2 days.  He denies any specific pleuritic component to his cough.  No chest pain.  No fevers or chills.  No known sick contacts.  Home Medications Prior to Admission medications   Medication Sig Start Date End Date Taking? Authorizing Provider  oseltamivir (TAMIFLU) 75 MG capsule Take 1 capsule (75 mg total) by mouth every 12 (twelve) hours. 09/21/21  Yes Regan Lemming, MD  acetaminophen (TYLENOL) 325 MG tablet Take by mouth.    [provider]  cabotegravir & rilpivirine ER (CABENUVA) 600 & 900 MG/3ML injection Inject 1 kit into the muscle every 2 (two) months. 02/01/21   Kuppelweiser, Cassie L, RPH-CPP  Cholecalciferol 25 MCG (1000 UT) tablet Take by mouth.    [provider]  doxycycline (VIBRA-TABS) 100 MG tablet Take 1 tablet (100 mg total) by mouth 2 (two) times daily. 04/28/21   [place] Callas, NP  Ensure (ENSURE) Take 237 mLs by mouth 2 (two) times daily between meals. 04/13/20   Truman Hayward, MD  loratadine (CLARITIN) 10 MG tablet Take 10 mg by mouth daily. 02/03/20   [provider]  methocarbamol (ROBAXIN) 500 MG tablet Take 500 mg by mouth 3 (three) times daily. 02/03/20   [provider]  Vitamin D, Ergocalciferol, (DRISDOL) 1.25 MG (50000 UNIT) CAPS capsule Take 50,000 Units by mouth once a week. 07/01/20   [provider]      Allergies    Ibuprofen    Review of Systems   Review of Systems  Respiratory:  Positive for cough and shortness of breath.   All other systems  reviewed and are negative.  Physical Exam Updated Vital Signs BP (!) 97/51    Pulse 84    Temp 98.2 F (36.8 C)    Resp 20    SpO2 98%  Physical Exam Vitals and nursing note reviewed.  Constitutional:      General: He is not in acute distress.    Appearance: He is well-developed.  HENT:     Head: Normocephalic and atraumatic.  Eyes:     Conjunctiva/sclera: Conjunctivae normal.  Cardiovascular:     Rate and Rhythm: Normal rate and regular rhythm.     Heart sounds: No murmur heard. Pulmonary:     Effort: Pulmonary effort is normal. No respiratory distress.     Breath sounds: Normal breath sounds.  Abdominal:     Palpations: Abdomen is soft.     Tenderness: There is no abdominal tenderness.  Musculoskeletal:        General: No swelling.     Cervical back: Neck supple.  Skin:    General: Skin is warm and dry.     Capillary Refill: Capillary refill takes less than 2 seconds.  Neurological:     Mental Status: He is alert.  Psychiatric:        Mood and Affect: Mood normal.    ED Results / Procedures / Treatments   Labs (all labs ordered are listed, but only abnormal results  are displayed) Labs Reviewed  RESP PANEL BY RT-PCR (FLU A&B, COVID) ARPGX2 - Abnormal; Notable for the following components:      Result Value   Influenza A by PCR POSITIVE (*)    All other components within normal limits  BASIC METABOLIC PANEL - Abnormal; Notable for the following components:   Creatinine, Ser 1.28 (*)    All other components within normal limits  CBC WITH DIFFERENTIAL/PLATELET  D-DIMER, QUANTITATIVE    EKG EKG Interpretation  Date/Time:  Tuesday September 21 2021 06:58:40 EST Ventricular Rate:  96 PR Interval:  150 QRS Duration: 84 QT Interval:  304 QTC Calculation: 384 R Axis:   161 Text Interpretation: Normal sinus rhythm Right axis deviation Abnormal ECG When compared with ECG of 06-Jan-2019 22:05, PREVIOUS ECG IS PRESENT Confirmed by Regan Lemming ([id number]) on 09/21/2021  9:06:58 AM  Radiology DG Chest 2 View  Result Date: 09/21/2021 CLINICAL DATA:  Pt. Stated, I've had a cough and went to SOB for 2 days. Pt has nipple rings that they can not remove. SOB EXAM: CHEST - 2 VIEW COMPARISON:  None. FINDINGS: Normal mediastinum and cardiac silhouette. Normal pulmonary vasculature. No evidence of effusion, infiltrate, or pneumothorax. No acute bony abnormality. IMPRESSION: No acute cardiopulmonary process. Electronically Signed   By: Suzy Bouchard M.D.   On: 09/21/2021 08:05    Procedures Procedures    Medications Ordered in ED Medications  albuterol (VENTOLIN HFA) 108 (90 Base) MCG/ACT inhaler 2 puff (2 puffs Inhalation Given 09/21/21 0912)    ED Course/ Medical Decision Making/ A&P Clinical Course as of 09/21/21 2047  Tue Sep 21, 2021  0935 Influenza A By PCR(!): POSITIVE [JL]    Clinical Course User Index [JL] Regan Lemming, MD                           Medical Decision Making Amount and/or Complexity of Data Reviewed Labs: ordered. Decision-making details documented in ED Course. Radiology: ordered.  Risk Prescription drug management.   26 year old male presented to the emergency department with a cough and shortness of breath for the past 2 days.  He denies any specific pleuritic component to his cough.  No chest pain.  No fevers or chills.  No known sick contacts.  On arrival, the patient was well-appearing.  Sinus tachycardia noted on initial arrival.  A D-dimer was collected to evaluate for PE and resulted negative.  The patient tested positive for influenza A by PCR testing.  He had a CBC without a leukocytosis, anemia or platelet abnormality and a BMP that was unremarkable.  A chest x-ray was performed which revealed no acute  cardiac or pulmonary abnormalities that was reviewed by myself and radiology.  I discussed with the patient the risks and benefits of Tamiflu.  He is on day 3 of symptoms.  The patient requests treatment with Tamiflu  which I prescribed.  Overall tolerating oral intake and well-appearing, stable for outpatient management.  DC Instructions: Your exam/testing today was overall reassuring.  You tested positive for influenza.  Recommend continued symptomatic management at home.  Tylenol and ibuprofen for fevers, continue to push oral fluids to remain hydrated.  Tamiflu has been sent to your pharmacy. Incubation period ? The typical incubation period is one to four days (average two days). The time between onset of illness among household contacts (known as the serial interval) is three to four days.   Final Clinical Impression(s) / ED Diagnoses Final diagnoses:  Acute cough  Influenza A    Rx / DC Orders ED Discharge Orders          Ordered    oseltamivir (TAMIFLU) 75 MG capsule  Every 12 hours        09/21/21 1011              Regan Lemming, MD 09/21/21 2047

## 2021-09-21 NOTE — Discharge Instructions (Addendum)
You were evaluated in the Emergency Department and after careful evaluation, we did not find any emergent condition requiring admission or further testing in the hospital.  Your exam/testing today was overall reassuring.  You tested positive for influenza.  Recommend continued symptomatic management at home.  Tylenol and ibuprofen for fevers, continue to push oral fluids to remain hydrated.  Tamiflu has been sent to your pharmacy. Incubation period ? The typical incubation period is one to four days (average two days). The time between onset of illness among household contacts (known as the serial interval) is three to four days.  Please return to the Emergency Department if you experience any worsening of your condition.  Thank you for allowing [place] to be a part of your care.

## 2021-10-04 ENCOUNTER — Other Ambulatory Visit (HOSPITAL_COMMUNITY): Payer: Self-pay

## 2021-10-07 ENCOUNTER — Emergency Department (HOSPITAL_COMMUNITY)
Admission: EM | Admit: 2021-10-07 | Discharge: 2021-10-07 | Discharge status: Against Medical Advice | Payer: Medicaid Other | Attending: Emergency Medicine | Admitting: Emergency Medicine

## 2021-10-07 ENCOUNTER — Other Ambulatory Visit: Payer: Self-pay

## 2021-10-07 ENCOUNTER — Emergency Department (HOSPITAL_COMMUNITY): Payer: Medicaid Other

## 2021-10-07 ENCOUNTER — Encounter (HOSPITAL_COMMUNITY): Payer: Self-pay | Admitting: Emergency Medicine

## 2021-10-07 ENCOUNTER — Emergency Department (HOSPITAL_COMMUNITY)
Admission: EM | Admit: 2021-10-07 | Discharge: 2021-10-07 | Disposition: A | Discharge status: Home / Self Care | Payer: Medicaid Other | Source: Home / Self Care | Attending: Emergency Medicine | Admitting: Emergency Medicine

## 2021-10-07 DIAGNOSIS — R079 Chest pain, unspecified: Secondary | ICD-10-CM | POA: Diagnosis not present

## 2021-10-07 DIAGNOSIS — R222 Localized swelling, mass and lump, trunk: Secondary | ICD-10-CM

## 2021-10-07 DIAGNOSIS — N61 Mastitis without abscess: Secondary | ICD-10-CM | POA: Diagnosis present

## 2021-10-07 DIAGNOSIS — Z21 Asymptomatic human immunodeficiency virus [HIV] infection status: Secondary | ICD-10-CM | POA: Insufficient documentation

## 2021-10-07 DIAGNOSIS — N611 Abscess of the breast and nipple: Secondary | ICD-10-CM | POA: Insufficient documentation

## 2021-10-07 DIAGNOSIS — N6459 Other signs and symptoms in breast: Secondary | ICD-10-CM | POA: Diagnosis not present

## 2021-10-07 DIAGNOSIS — R0789 Other chest pain: Secondary | ICD-10-CM | POA: Diagnosis not present

## 2021-10-07 LAB — BASIC METABOLIC PANEL
Anion gap: 10 (ref 5–15)
BUN: 16 mg/dL (ref 6–20)
CO2: 24 mmol/L (ref 22–32)
Calcium: 9.2 mg/dL (ref 8.9–10.3)
Chloride: 104 mmol/L (ref 98–111)
Creatinine, Ser: 1.17 mg/dL (ref 0.61–1.24)
GFR, Estimated: >60 mL/min (ref >60)
Glucose, Bld: 122 mg/dL — ABNORMAL HIGH (ref 70–99)
Potassium: 3.7 mmol/L (ref 3.5–5.1)
Sodium: 138 mmol/L (ref 135–145)

## 2021-10-07 LAB — CBC
HCT: 41.8 % (ref 39.0–52.0)
Hemoglobin: 13.6 g/dL (ref 13.0–17.0)
MCH: 28.6 pg (ref 26.0–34.0)
MCHC: 32.5 g/dL (ref 30.0–36.0)
MCV: 88 fL (ref 80.0–100.0)
Platelets: 291 10*3/uL (ref 150–400)
RBC: 4.75 MIL/uL (ref 4.22–5.81)
RDW: 12.6 % (ref 11.5–15.5)
WBC: 11.6 10*3/uL — ABNORMAL HIGH (ref 4.0–10.5)
nRBC: 0 % (ref 0.0–0.2)

## 2021-10-07 MED ORDER — DOXYCYCLINE HYCLATE 100 MG PO CAPS: 100.0000 mg | ORAL_CAPSULE | Freq: Two times a day (BID) | ORAL | 0 refills | Status: DC

## 2021-10-07 MED ORDER — DOXYCYCLINE HYCLATE 100 MG PO TABS
100.0000 mg | ORAL_TABLET | Freq: Once | ORAL | Status: AC
  Administered 2021-10-07: 100 mg via ORAL
  Filled 2021-10-07: qty 1

## 2021-10-07 NOTE — ED Provider Notes (Signed)
Gerald Lee   CSN: [id number] Arrival date & time: 10/07/21  1134     History  Chief Complaint  Patient presents with   Wound Infection    Gerald Lee is a 26 y.o. male.  26 yo M with a chief complaints of an infection to his left breast.  The patient had some irritation to it after it was bumped into while at work and he had his nipple ring removed.  After which she had a large amounts of purulence that was expressed.  Since then it is gotten worse over the past 3 or 4 days.  Spreading redness.  No fevers or chills.       Home Medications Prior to Admission medications   Medication Sig Start Date End Date Taking? Authorizing Provider  doxycycline (VIBRAMYCIN) 100 MG capsule Take 1 capsule (100 mg total) by mouth 2 (two) times daily. One po bid x 7 days 10/07/21  Yes Deno Etienne, DO  acetaminophen (TYLENOL) 325 MG tablet Take by mouth.    [provider]  cabotegravir & rilpivirine ER (CABENUVA) 600 & 900 MG/3ML injection Inject 1 kit into the muscle every 2 (two) months. 02/01/21   Kuppelweiser, Cassie L, RPH-CPP  Cholecalciferol 25 MCG (1000 UT) tablet Take by mouth.    [provider]  Ensure (ENSURE) Take 237 mLs by mouth 2 (two) times daily between meals. 04/13/20   Truman Hayward, MD  loratadine (CLARITIN) 10 MG tablet Take 10 mg by mouth daily. 02/03/20   [provider]  methocarbamol (ROBAXIN) 500 MG tablet Take 500 mg by mouth 3 (three) times daily. 02/03/20   [provider]  oseltamivir (TAMIFLU) 75 MG capsule Take 1 capsule (75 mg total) by mouth every 12 (twelve) hours. 09/21/21   Regan Lemming, MD  Vitamin D, Ergocalciferol, (DRISDOL) 1.25 MG (50000 UNIT) CAPS capsule Take 50,000 Units by mouth once a week. 07/01/20   [provider]      Allergies    Ibuprofen    Review of Systems   Review of Systems  Physical Exam Updated Vital Signs BP (!) 144/79 (BP Location:  Right Arm)    Pulse 94    Temp 98.5 F (36.9 C) (Oral)    Resp 18    SpO2 100%  Physical Exam Vitals and nursing Lee reviewed.  Constitutional:      Appearance: He is well-developed.  HENT:     Head: Normocephalic and atraumatic.  Eyes:     Pupils: Pupils are equal, round, and reactive to light.  Neck:     Vascular: No JVD.  Cardiovascular:     Rate and Rhythm: Normal rate and regular rhythm.     Heart sounds: No murmur heard.   No friction rub. No gallop.  Pulmonary:     Effort: No respiratory distress.     Breath sounds: No wheezing.  Abdominal:     General: There is no distension.     Tenderness: There is no abdominal tenderness. There is no guarding or rebound.  Musculoskeletal:        General: Normal range of motion.     Cervical back: Normal range of motion and neck supple.     Comments: Deformity of the nipple with fluctuance and erythema surrounding.  No obvious drainage.  Skin:    Coloration: Skin is not pale.     Findings: No rash.  Neurological:     Mental Status: He is alert  and oriented to person, place, and time.  Psychiatric:        Behavior: Behavior normal.    ED Results / Procedures / Treatments   Labs (all labs ordered are listed, but only abnormal results are displayed) Labs Reviewed - No data to display  EKG None  Radiology DG Chest 2 View  Result Date: 10/07/2021 CLINICAL DATA:  26 year old male with history of chest pain. EXAM: CHEST - 2 VIEW COMPARISON:  Chest x-ray 09/21/2021. FINDINGS: Lung volumes are normal. No consolidative airspace disease. No pleural effusions. No pneumothorax. No pulmonary nodule or mass noted. Pulmonary vasculature and the cardiomediastinal silhouette are within normal limits. IMPRESSION: No radiographic evidence of acute cardiopulmonary disease. Electronically Signed   By: Vinnie Langton M.D.   On: 10/07/2021 07:51    Procedures Procedures    Medications Ordered in ED Medications  doxycycline (VIBRA-TABS)  tablet 100 mg (has no administration in time range)    ED Course/ Medical Decision Making/ A&P                           Medical Decision Making Amount and/or Complexity of Data Reviewed Radiology: ordered.  Risk Prescription drug management.   Patient is a 26 y.o. male with a cc of erythema and pain to the left breast.  This has been going on for about 3 to 4 days.  He had the nipple ring removed with significant purulent drainage.  The drainage is stopped and had worsening pain and swelling over the past few days.  Clinically the patient likely has abscess with cellulitis.  Per protocol will place order for an ID done at the breast center.  He states that he does have primary care follow-up.  On record review the patient does have a history of HIV.  appears to be somewhat well controlled.  1:06 PM:  I have discussed the diagnosis/risks/treatment options with the patient.  Evaluation and diagnostic testing in the emergency department does not suggest an emergent condition requiring admission or immediate intervention beyond what has been performed at this time.  They will follow up with  PCP. We also discussed returning to the ED immediately if new or worsening sx occur. We discussed the sx which are most concerning (e.g., sudden worsening pain, fever, inability to tolerate by mouth) that necessitate immediate return. Medications administered to the patient during their visit and any new prescriptions provided to the patient are listed below.  Medications given during this visit Medications  doxycycline (VIBRA-TABS) tablet 100 mg (has no administration in time range)     The patient appears reasonably screen and/or stabilized for discharge and I doubt any other medical condition or other [facility] requiring further screening, evaluation, or treatment in the ED at this time prior to discharge.          Final Clinical Impression(s) / ED Diagnoses Final diagnoses:  Breast abscess    Rx  / DC Orders ED Discharge Orders          Ordered    doxycycline (VIBRAMYCIN) 100 MG capsule  2 times daily        10/07/21 1300    US BREAST ASPIRATION LEFT        10/07/21 [place], Colden Samaras, DO 10/07/21 1306

## 2021-10-07 NOTE — ED Provider Triage Note (Signed)
Emergency Medicine Provider Triage Evaluation Note  Gerald Lee , a 26 y.o. male  was evaluated in triage.  Pt complains of left breast pain.  Patient was hit in the left breast 2 days ago at work while wearing a nipple ring.  Patient took out the nipple ring has had progressive swelling, redness, tenderness and discharge from the left breast..  Review of Systems  Positive: Breast pain Negative: Fever  Physical Exam  There were no vitals taken for this visit. Gen:   Awake, no distress   Resp:  Normal effort  MSK:   Moves extremities without difficulty  Other:  Significant swelling, redness, induration and fluctuance behind the left nipple suggestive of abscess and cellulitis  Medical Decision Making  Medically screening exam initiated at 6:37 AM.  Appropriate orders placed.  PAUL GARDEA was informed that the remainder of the evaluation will be completed by another provider, this initial triage assessment does not replace that evaluation, and the importance of remaining in the ED until their evaluation is complete.  Work-up initiated   Margarita Mail, PA-C 10/07/21 [id number]

## 2021-10-07 NOTE — ED Provider Triage Note (Signed)
Emergency Medicine Provider Triage Evaluation Note  Gerald Lee , a 26 y.o. male  was evaluated in triage.  Pt complains of breast infection, patient had a nipple ring in place in the left breast, he got hit in the chest and thinks this irritated the nipple ring, over the past 4 days he has had increasing swelling and pain.  Went to the tattoo parlor and they removed the piercing but patient has continued to have worsening pain and swelling with some purulent drainage.  Redness spreading throughout the breast.  Patient initially went to [facility], had blood work done there but left due to wait time.  Review of Systems  Positive: Breast pain, redness Negative: Fevers, vomiting  Physical Exam  BP (!) 144/79 (BP Location: Right Arm)    Pulse 94    Temp 98.5 F (36.9 C) (Oral)    Resp 18    SpO2 100%  Gen:   Awake, no distress   Resp:  Normal effort  MSK:   Moves extremities without difficulty  Other:  Left breast with significant erythema, swelling and induration surrounding the nipple without expressible drainage  Medical Decision Making  Medically screening exam initiated at 11:44 AM.  Appropriate orders placed.  Gerald Lee was informed that the remainder of the evaluation will be completed by another provider, this initial triage assessment does not replace that evaluation, and the importance of remaining in the ED until their evaluation is complete.     Dartha Lodge, [place] 10/07/21 1150

## 2021-10-07 NOTE — ED Notes (Signed)
Pt decided to leave 

## 2021-10-07 NOTE — ED Triage Notes (Signed)
Patient reports having an infected nipple ring x4 days with swelling and drainage. He reports having the jewelry removed and noted pus at the site. Denies fevers.

## 2021-10-07 NOTE — ED Triage Notes (Signed)
Please see downtime forms for information from patient arrival until 0630.

## 2021-10-07 NOTE — Discharge Instructions (Addendum)
Warm compresses at least 4 times a day.  Take antibiotics as prescribed.  Please call the breast center to be evaluated.

## 2021-10-11 ENCOUNTER — Other Ambulatory Visit (HOSPITAL_COMMUNITY): Payer: Self-pay

## 2021-10-11 DIAGNOSIS — N611 Abscess of the breast and nipple: Secondary | ICD-10-CM | POA: Diagnosis not present

## 2021-10-12 ENCOUNTER — Telehealth: Payer: Self-pay

## 2021-10-12 NOTE — Telephone Encounter (Signed)
[facility] Patient Advocate Encounter  Patient's medication Gerald Lee) have been couriered to [facility] from [employer] and will be administered on patient next office visit on 10/20/21.  [name] , CPhT Specialty Pharmacy Patient [name] Phone: [phone] Fax:  [phone]

## 2021-10-20 ENCOUNTER — Ambulatory Visit (INDEPENDENT_AMBULATORY_CARE_PROVIDER_SITE_OTHER): Payer: Medicaid Other | Admitting: Pharmacist

## 2021-10-20 ENCOUNTER — Other Ambulatory Visit: Payer: Self-pay

## 2021-10-20 DIAGNOSIS — Z79899 Other long term (current) drug therapy: Secondary | ICD-10-CM | POA: Diagnosis not present

## 2021-10-20 DIAGNOSIS — N61 Mastitis without abscess: Secondary | ICD-10-CM | POA: Diagnosis not present

## 2021-10-20 DIAGNOSIS — B20 Human immunodeficiency virus [HIV] disease: Secondary | ICD-10-CM

## 2021-10-20 MED ORDER — CABOTEGRAVIR & RILPIVIRINE ER 600 & 900 MG/3ML IM SUER
1.0000 | Freq: Once | INTRAMUSCULAR | Status: AC
  Administered 2021-10-20: 1 via INTRAMUSCULAR

## 2021-10-20 NOTE — Progress Notes (Signed)
HPI: Gerald Lee is a 26 y.o. male who presents to the [facility] for [place] administration.  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   CABOTEGRAVIR & RILPIVIRINE ER (CABENUVA) 600 & 900 MG/3ML INJECTION    Inject 1 kit into the muscle every 2 (two) months.   CHOLECALCIFEROL 25 MCG (1000 UT) TABLET    Take by mouth.   DOXYCYCLINE (VIBRAMYCIN) 100 MG CAPSULE    Take 1 capsule (100 mg total) by mouth 2 (two) times daily. One po bid x 7 days   ENSURE (ENSURE)    Take 237 mLs by mouth 2 (two) times daily between meals.   LORATADINE (CLARITIN) 10 MG TABLET    Take 10 mg by mouth daily.   METHOCARBAMOL (ROBAXIN) 500 MG TABLET    Take 500 mg by mouth 3 (three) times daily.   OSELTAMIVIR (TAMIFLU) 75 MG CAPSULE    Take 1 capsule (75 mg total) by mouth every 12 (twelve) hours.   VITAMIN D, ERGOCALCIFEROL, (DRISDOL) 1.25 MG (50000 UNIT) CAPS CAPSULE    Take 50,000 Units by mouth once a week.  Modified Medications   No medications on file  Discontinued Medications   No medications on file    Allergies: Allergies  Allergen Reactions   Ibuprofen Other (See Comments)    Per patient "can not take because it will shut down my kidneys"    Past Medical History: Past Medical  History:  Diagnosis Date   Asthma    Headache(784.0)    HIV (human immunodeficiency virus infection) ([place])    HIV infection ([place])    Orthostatic hypotension     Social History: Social History   Socioeconomic History   Marital status: Single    Spouse name: Not on file   Number of children: Not on file   Years of education: Not on file   Highest education level: Not on file  Occupational History   Not on file  Tobacco Use   Smoking status: Former    Packs/day: 0.10    Types: Cigarettes, Cigars    Quit date: 11/29/2019    Years since quitting: 1.8   Smokeless tobacco: Never   Tobacco comments:    "only when super stressed"  Vaping Use   Vaping Use: Some days  Substance and Sexual Activity   Alcohol use: Yes    Comment: occ; drink daily   Drug use: Yes    Frequency: 7.0 times per week    Types: Marijuana    Comment: occassionally   Sexual activity: Yes    Partners: Male    Birth control/protection: Condom    Comment: declined condoms 1/21  Other Topics Concern   Not on file  Social History Narrative   Not on file   Social Determinants of Health  Financial Resource Strain: Not on file  Food Insecurity: Not on file  Transportation Needs: Not on file  Physical Activity: Not on file  Stress: Not on file  Social Connections: Not on file    Labs: Lab Results  Component Value Date   HIV1RNAQUANT Not Detected 08/19/2021   HIV1RNAQUANT Not Detected 04/27/2021   HIV1RNAQUANT Not Detected 02/17/2021   CD4TABS 916 08/19/2021   CD4TABS 1,014 09/10/2019   CD4TABS 968 03/11/2019    RPR and STI Lab Results  Component Value Date   LABRPR REACTIVE (A) 04/27/2021   LABRPR REACTIVE (A) 02/17/2021   LABRPR REACTIVE (A) 12/10/2020   LABRPR REACTIVE (A) 10/16/2019   LABRPR REACTIVE (A) 09/10/2019   RPRTITER 1:2 (H) 04/27/2021   RPRTITER 1:16 (H) 02/17/2021   RPRTITER 1:16 (H) 12/10/2020   RPRTITER 1:128 (H) 10/16/2019   RPRTITER 1:16 (H) 09/10/2019    STI  Results GC CT  04/27/2021 Positive(A) Negative  04/27/2021 Negative Negative  04/27/2021 Positive(A) Negative  12/10/2020 Negative Negative  12/10/2020 Negative Positive(A)  12/10/2020 Negative Positive(A)  10/16/2019 Negative Negative  05/11/2019 **POSITIVE**(A) **POSITIVE**(A)  03/11/2019 Negative Negative  01/06/2019 Negative Negative  10/01/2018 **POSITIVE**(A) Negative  06/11/2018 Negative Negative  06/11/2018 Negative Negative  06/11/2018 Negative Negative  02/16/2018 Negative Negative  10/13/2017 Negative Negative  09/11/2017 Negative Negative  05/22/2017 Negative **POSITIVE**(A)  05/22/2017 Negative Negative  05/17/2017 Negative Negative    Hepatitis B Lab Results  Component Value Date   HEPBSAB REACTIVE (A) 03/11/2019   HEPBSAG NON-REACTIVE 05/22/2017   Hepatitis C Lab Results  Component Value Date   HEPCAB NON-REACTIVE 05/22/2017   Hepatitis A Lab Results  Component Value Date   HAV REACTIVE (A) 05/22/2017   Lipids: Lab Results  Component Value Date   CHOL 109 10/30/2020   TRIG 43 10/30/2020   HDL 38 10/30/2020   CHOLHDL 3.4 03/11/2019   [place] 62 10/30/2020    TARGET DATE:  The 22nd of the month  Current HIV Regimen: Cabenuva  Assessment: [name] presents today for their maintenance Cabenuva injections. Initial/past injections were tolerated well without issues. No problems with systemic effects of injections. Administered cabotegravir 624m/3mL in left upper outer quadrant of the gluteal muscle. Administered rilpivirine 900 mg/35min the right upper outer quadrant of the gluteal muscle. Monitored patient for 10 minutes after injection. Injections were tolerated well without issue. Patient will follow up in 2 months for next injection.  Patient developed a breast abscess / nipple infection after his nipple/nipple ring was hit at work by piece of metal. Has been improving well on doxycycline and Keflex. Drained naturally around time of initial infection; no I&D needed  at the ED.   Plan: - Cabenuva injections administered - Next injections scheduled for 4/14 with StColletta [place]nd 6/22 with me - Call with any issues or questions  AmAlfonse SprucePharmD, CPP Clinical Pharmacist Practitioner [place]

## 2021-10-21 LAB — C. TRACHOMATIS/N. GONORRHOEAE RNA
C. trachomatis RNA, TMA: NOT DETECTED
N. gonorrhoeae RNA, TMA: NOT DETECTED

## 2021-10-21 LAB — CT/NG RNA, TMA RECTAL
Chlamydia Trachomatis RNA: NOT DETECTED
Neisseria Gonorrhoeae RNA: NOT DETECTED

## 2021-10-21 LAB — GC/CHLAMYDIA PROBE, AMP (THROAT)
Chlamydia trachomatis RNA: NOT DETECTED
Neisseria gonorrhoeae RNA: NOT DETECTED

## 2021-12-02 ENCOUNTER — Other Ambulatory Visit (HOSPITAL_COMMUNITY): Payer: Self-pay

## 2021-12-03 ENCOUNTER — Other Ambulatory Visit (HOSPITAL_COMMUNITY): Payer: Self-pay

## 2021-12-06 ENCOUNTER — Telehealth: Payer: Self-pay

## 2021-12-06 NOTE — Telephone Encounter (Signed)
[facility] Patient Advocate Encounter ? ?Patient's medication Kern Reap) have been couriered to [facility] from [employer] and will be administered on the patient next office visit on 12/10/21. ? ?Ileene Patrick , CPhT ?Specialty Pharmacy Patient Advocate ?[facility] for [facility] ?Phone: [phone] ?Fax:  [phone]  ?

## 2021-12-10 ENCOUNTER — Encounter: Payer: Medicaid Other | Admitting: Pharmacist

## 2021-12-10 ENCOUNTER — Encounter: Payer: Medicaid Other | Admitting: Infectious Diseases

## 2021-12-14 ENCOUNTER — Ambulatory Visit (INDEPENDENT_AMBULATORY_CARE_PROVIDER_SITE_OTHER): Payer: Medicaid Other | Admitting: Pharmacist

## 2021-12-14 ENCOUNTER — Other Ambulatory Visit: Payer: Self-pay

## 2021-12-14 DIAGNOSIS — Z8619 Personal history of other infectious and parasitic diseases: Secondary | ICD-10-CM | POA: Diagnosis not present

## 2021-12-14 DIAGNOSIS — B20 Human immunodeficiency virus [HIV] disease: Secondary | ICD-10-CM | POA: Diagnosis not present

## 2021-12-14 MED ORDER — CABOTEGRAVIR & RILPIVIRINE ER 600 & 900 MG/3ML IM SUER
1.0000 | Freq: Once | INTRAMUSCULAR | Status: AC
  Administered 2021-12-14: 1 via INTRAMUSCULAR

## 2021-12-14 NOTE — Progress Notes (Signed)
? ?HPI: Gerald Lee is a 26 y.o. male who presents to the [facility] clinic for [place] administration. ? ?Patient Active Problem List  ? Diagnosis Date Noted  ? History of COVID-19 09/18/2020  ? 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 Traumatic injury of small intestine 01/06/2020  ? Weight loss counseling, encounter for 09/10/2019  ? Muscle cramps 03/25/2019  ? 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 History of syphilis 07/11/2017  ? 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 Asymptomatic HIV infection ([place]) 05/22/2017  ? Healthcare maintenance 05/22/2017  ? Migraine without aura 03/14/2014  ? Obesity, unspecified 03/14/2014  ? Insomnia, unspecified 03/14/2014  ? ? ?Patient's Medications  ?New Prescriptions  ? No medications on file  ?Previous Medications  ? ACETAMINOPHEN (TYLENOL) 325 MG TABLET    Take by mouth.  ? CABOTEGRAVIR & RILPIVIRINE ER (CABENUVA) 600 & 900 MG/3ML INJECTION    Inject 1 kit into the muscle every 2 (two) months.  ? CHOLECALCIFEROL 25 MCG (1000 UT) TABLET    Take by mouth.  ? DOXYCYCLINE (VIBRAMYCIN) 100 MG CAPSULE    Take 1 capsule (100 mg total) by mouth 2 (two) times daily. One po bid x 7 days  ? ENSURE (ENSURE)    Take 237 mLs by mouth 2 (two) times daily between meals.  ? LORATADINE (CLARITIN) 10 MG TABLET    Take 10 mg by mouth daily.  ? METHOCARBAMOL (ROBAXIN) 500 MG TABLET    Take 500 mg by mouth 3 (three) times daily.  ? OSELTAMIVIR (TAMIFLU) 75 MG CAPSULE    Take 1 capsule (75 mg total) by mouth every 12 (twelve) hours.  ? VITAMIN D, ERGOCALCIFEROL, (DRISDOL) 1.25 MG (50000 UNIT) CAPS CAPSULE    Take 50,000 Units by mouth once a week.  ?Modified Medications  ? No medications on file  ?Discontinued Medications  ? No medications on file  ? ? ?Allergies: ?Allergies  ?Allergen Reactions  ? Ibuprofen Other (See Comments)  ?  Per patient "can not take because it will shut down my kidneys"  ? ? ?Past Medical History: ?Past Medical  History:  ?Diagnosis Date  ? Asthma   ? Headache(784.0)   ? HIV (human immunodeficiency virus infection) ([place])   ? HIV infection ([place])   ? Orthostatic hypotension   ? ? ?Social History: ?Social History  ? ?Socioeconomic History  ? Marital status: Single  ?  Spouse name: Not on file  ? Number of children: Not on file  ? Years of education: Not on file  ? Highest education level: Not on file  ?Occupational History  ? Not on file  ?Tobacco Use  ? Smoking status: Former  ?  Packs/day: 0.10  ?  Types: Cigarettes, Cigars  ?  Quit date: 11/29/2019  ?  Years since quitting: 2.0  ? Smokeless tobacco: Never  ? Tobacco comments:  ?  "only when super stressed"  ?Vaping Use  ? Vaping Use: Some days  ?Substance and Sexual Activity  ? Alcohol use: Yes  ?  Comment: occ; drink daily  ? Drug use: Yes  ?  Frequency: 7.0 times per week  ?  Types: Marijuana  ?  Comment: occassionally  ? Sexual activity: Yes  ?  Partners: Male  ?  Birth control/protection: Condom  ?  Comment: declined condoms 1/21  ?Other Topics Concern  ? Not on file  ?Social History Narrative  ? Not on file  ? ?Social Determinants of Health  ? ?  Financial Resource Strain: Not on file  ?Food Insecurity: Not on file  ?Transportation Needs: Not on file  ?Physical Activity: Not on file  ?Stress: Not on file  ?Social Connections: Not on file  ? ? ?Labs: ?Lab Results  ?Component Value Date  ? HIV1RNAQUANT Not Detected 08/19/2021  ? HIV1RNAQUANT Not Detected 04/27/2021  ? HIV1RNAQUANT Not Detected 02/17/2021  ? CD4TABS 916 08/19/2021  ? CD4TABS 1,014 09/10/2019  ? CD4TABS 968 03/11/2019  ? ? ?RPR and STI ?Lab Results  ?Component Value Date  ? LABRPR REACTIVE (A) 04/27/2021  ? LABRPR REACTIVE (A) 02/17/2021  ? LABRPR REACTIVE (A) 12/10/2020  ? LABRPR REACTIVE (A) 10/16/2019  ? LABRPR REACTIVE (A) 09/10/2019  ? RPRTITER 1:2 (H) 04/27/2021  ? RPRTITER 1:16 (H) 02/17/2021  ? RPRTITER 1:16 (H) 12/10/2020  ? RPRTITER 1:128 (H) 10/16/2019  ? RPRTITER 1:16 (H) 09/10/2019  ? ? ?STI  Results GC CT  ?04/27/2021 ? 3:11 PM Positive    ? Positive    ? Negative   Negative    ? Negative    ? Negative    ?12/10/2020 ? 3:36 PM Negative    ? Negative    ? Negative   Negative    ? Positive    ? Positive    ?10/16/2019 ?11:05 AM Negative   Negative    ?05/11/2019 ?12:00 AM **POSITIVE**  C **POSITIVE**  C  ?03/11/2019 ?12:00 AM Negative   Negative    ?01/06/2019 ?12:00 AM Negative   Negative    ?10/01/2018 ?12:00 AM **POSITIVE**   Negative    ?06/11/2018 ?12:00 AM Negative    ? Negative    ? Negative   Negative    ? Negative    ? Negative    ?02/16/2018 ?12:00 AM Negative   Negative    ?10/13/2017 ?12:00 AM Negative   Negative    ?09/11/2017 ?12:00 AM Negative   Negative    ?05/22/2017 ?12:00 AM Negative    ? Negative   Negative    ? **POSITIVE**    ?05/17/2017 ?12:00 AM Negative   Negative    ?  ?C Corrected result  ? ? ?Hepatitis B ?Lab Results  ?Component Value Date  ? HEPBSAB REACTIVE (A) 03/11/2019  ? HEPBSAG NON-REACTIVE 05/22/2017  ? ?Hepatitis C ?Lab Results  ?Component Value Date  ? HEPCAB NON-REACTIVE 05/22/2017  ? ?Hepatitis A ?Lab Results  ?Component Value Date  ? HAV REACTIVE (A) 05/22/2017  ? ?Lipids: ?Lab Results  ?Component Value Date  ? CHOL 109 10/30/2020  ? TRIG 43 10/30/2020  ? HDL 38 10/30/2020  ? CHOLHDL 3.4 03/11/2019  ? [place] 62 10/30/2020  ? ? ?TARGET DATE: ?The 22nd of the month ? ?Current HIV Regimen:  ?Cabenuva ? ?Assessment: ?Gerald Lee presents today for their maintenance Cabenuva injections. Past injections were tolerated well without issues. No problems with systemic side effects of injections. No new partners since last visit; politely declined STI testing today. Given history of syphilis, will check RPR today.  ? ?Administered cabotegravir 680m/3mL in left upper outer quadrant of the gluteal muscle. Administered rilpivirine 900 mg/340min the right upper outer quadrant of the gluteal muscle. Injections were tolerated well. Will check HIV RNA today. Patient will follow up in 2 months for  next set of injections.  ? ?Plan: ?- Cabenuva injections administered ?- Check HIV RNA, RPR, and lipid panel today. ?- Next follow-up visit scheduled for 6/5 with StColletta [place]- Next injections scheduled for 6/20 and 8/22 with AmEstill Bamberg- Call  with any issues or questions ? ?Vance Peper, PharmD ?PGY1 Pharmacy Resident ?12/14/2021 10:30 AM  ?

## 2021-12-16 ENCOUNTER — Encounter: Payer: Self-pay | Admitting: Pharmacist

## 2021-12-16 NOTE — Telephone Encounter (Signed)
Believe this was meant for you. Thanks!

## 2021-12-18 LAB — LIPID PANEL
Cholesterol: 119 mg/dL (ref <200)
HDL: 44 mg/dL (ref >=40)
LDL Cholesterol (Calc): 61 mg/dL (calc)
Non-HDL Cholesterol (Calc): 75 mg/dL (calc) (ref <130)
Total CHOL/HDL Ratio: 2.7 (calc) (ref <5.0)
Triglycerides: 60 mg/dL (ref <150)

## 2021-12-18 LAB — HIV-1 RNA QUANT-NO REFLEX-BLD
HIV 1 RNA Quant: NOT DETECTED copies/mL
HIV-1 RNA Quant, Log: NOT DETECTED Log copies/mL

## 2021-12-18 LAB — RPR: RPR Ser Ql: NONREACTIVE

## 2022-01-21 ENCOUNTER — Other Ambulatory Visit (HOSPITAL_COMMUNITY): Payer: Self-pay

## 2022-01-21 ENCOUNTER — Other Ambulatory Visit: Payer: Self-pay | Admitting: Pharmacist

## 2022-01-21 DIAGNOSIS — Z21 Asymptomatic human immunodeficiency virus [HIV] infection status: Secondary | ICD-10-CM

## 2022-01-21 MED ORDER — CABENUVA 600 & 900 MG/3ML IM SUER
1.0000 | INTRAMUSCULAR | 5 refills | Status: DC
Start: 2022-01-21 — End: 2022-08-02
  Filled 2022-01-21 (×2): qty 6, 60d supply, fill #0
  Filled 2022-03-11 – 2022-04-07 (×2): qty 6, 60d supply, fill #1
  Filled 2022-05-31: qty 6, 60d supply, fill #2

## 2022-01-22 ENCOUNTER — Other Ambulatory Visit (HOSPITAL_COMMUNITY): Payer: Self-pay

## 2022-01-25 ENCOUNTER — Other Ambulatory Visit (HOSPITAL_COMMUNITY): Payer: Self-pay

## 2022-01-25 ENCOUNTER — Telehealth: Payer: Self-pay

## 2022-01-25 NOTE — Telephone Encounter (Signed)
[facility] Patient Advocate Encounter  Patient's medication (Cabenuva) have been couriered to [facility] from [facility] and will be administered on the patient next office visit on 01/31/22.  Broderic Bara , CPhT Specialty Pharmacy Patient [facility] Phone: [phone] Fax:  [phone]  

## 2022-01-31 ENCOUNTER — Encounter: Payer: Self-pay | Admitting: Infectious Diseases

## 2022-01-31 ENCOUNTER — Other Ambulatory Visit: Payer: Self-pay

## 2022-01-31 ENCOUNTER — Ambulatory Visit (INDEPENDENT_AMBULATORY_CARE_PROVIDER_SITE_OTHER): Payer: Medicaid Other | Admitting: Infectious Diseases

## 2022-01-31 VITALS — BP 111/74 | HR 80 | Temp 97.9°F | Wt 282.4 lb

## 2022-01-31 DIAGNOSIS — Z21 Asymptomatic human immunodeficiency virus [HIV] infection status: Secondary | ICD-10-CM

## 2022-01-31 DIAGNOSIS — Z23 Encounter for immunization: Secondary | ICD-10-CM

## 2022-01-31 NOTE — Patient Instructions (Signed)
Nice to see you as always!   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 Your next shot is due June 20th with our pharmacy team.   I will plan to se you back either at your October or December injection appointment

## 2022-01-31 NOTE — Assessment & Plan Note (Signed)
Doing very well on Cabenuva injections. Finds them a huge win for adherence and very convenient for schedule.  VLs have all been suppressed and no concern with continuing these for [name].  Tetanus updated today. Needs Prevnar 20 in winter to complete pneumococcal series. Influenza recommended Sept/October.  LFTs unremarkable - continue with q1-2 times a year check.   RTC as scheduled with pharmacy on 6/20 for cabenuva injection and August dose. Will plan to see him back in either October / November for a visit with me.

## 2022-01-31 NOTE — Addendum Note (Signed)
Addended by: Marcell Anger on: 01/31/2022 10:09 AM   Modules accepted: Orders

## 2022-01-31 NOTE — Progress Notes (Signed)
Name: Gerald Lee  DOB: 09-Mar-1996  MRN: [id number] PCP: Nolene Ebbs, MD    Brief Narrative:  Unnamed is a 26 y.o. AA male with well controlled HIV, Dx 04-2017 with CD4 nadir 780.  Hep B sAg (-) High Risk: MSM.  Previous OIs: none  Previous Regimens:  Biktarvy 2018 --> suppressed  Cabenuva injections   Genotype:  02/2017 - no mutations   Subjective:   Chief Complaint  Patient presents with   Follow-up    No concerns today. Gerald Lee is going well!      HPI:  [name] is doing well. Just tired today. 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 Eating and drinking well. No concern over anxious or depressed mood. One consistent sexual partner and declined screening today.   Suffering from allergies - has been using Claritin but not as consistent taking it but will increase to daily given hoarse voice.    Review of Systems  Constitutional:  Negative for chills and fever.  HENT:  Negative for tinnitus.   Eyes:  Negative for blurred vision and photophobia.  Respiratory:  Negative for cough and sputum production.   Cardiovascular:  Negative for chest pain.  Gastrointestinal:  Negative for diarrhea, nausea and vomiting.  Genitourinary:  Negative for dysuria.  Skin:  Negative for rash.  Neurological:  Negative for headaches.    Past Medical History:  Diagnosis Date   Asthma    Headache(784.0)    HIV (human immunodeficiency virus infection) ([place])    HIV infection ([place])    Orthostatic hypotension     Social History   Tobacco Use   Smoking status: Former    Packs/day: 0.10    Types: Cigarettes, Cigars    Quit date: 11/29/2019    Years since quitting: 2.1   Smokeless tobacco: Never   Tobacco comments:    "only when super stressed"  Vaping Use   Vaping Use: Some days  Substance Use Topics   Alcohol use: Yes    Comment: occ; drink daily   Drug use: Yes    Frequency: 7.0 times per week    Types: Marijuana    Comment:  occassionally    Allergies  Allergen Reactions   Ibuprofen Other (See Comments)    Per patient "can not take because it will shut down my kidneys"    Objective:  Vitals:   01/31/22 0937  BP: 111/74  Pulse: 80  Temp: 97.9 F (36.6 C)  TempSrc: Oral  SpO2: 96%  Weight: 282 lb 6.4 oz (128.1 kg)   Body mass index is 34.37 kg/m.  Physical Exam Constitutional:      Appearance: He is well-developed.     Comments: Seated comfortably in chair during visit.   HENT:     Mouth/Throat:     Dentition: Normal dentition. No dental abscesses.  Cardiovascular:     Rate and Rhythm: Normal rate and regular rhythm.     Heart sounds: Normal heart sounds.  Pulmonary:     Effort: Pulmonary effort is normal.     Breath sounds: Normal breath sounds.  Abdominal:     General: There is no distension.     Palpations: Abdomen is soft.     Tenderness: There is no abdominal tenderness.  Lymphadenopathy:     Cervical: No cervical adenopathy.  Skin:    General: Skin is warm and dry.     Findings: No rash.  Neurological:  Mental Status: He is alert and oriented to person, place, and time.  Psychiatric:        Judgment: Judgment normal.     Comments: In good spirits today and engaged in care discussion.      Lab Results Lab Results  Component Value Date   WBC 11.6 (H) 10/07/2021   HGB 13.6 10/07/2021   HCT 41.8 10/07/2021   MCV 88.0 10/07/2021   PLT 291 10/07/2021    Lab Results  Component Value Date   CREATININE 1.17 10/07/2021   BUN 16 10/07/2021   NA 138 10/07/2021   K 3.7 10/07/2021   CL 104 10/07/2021   CO2 24 10/07/2021    Lab Results  Component Value Date   ALT 17 10/30/2020   AST 18 10/30/2020   ALKPHOS 123 10/30/2020   BILITOT 0.5 03/11/2019    Lab Results  Component Value Date   CHOL 119 12/14/2021   HDL 44 12/14/2021   LDLCALC 61 12/14/2021   TRIG 60 12/14/2021   CHOLHDL 2.7 12/14/2021   HIV 1 RNA Quant  Date Value  12/14/2021 NOT DETECTED copies/mL   08/19/2021 Not Detected Copies/mL  04/27/2021 Not Detected Copies/mL   CD4 T Cell Abs (/uL)  Date Value  08/19/2021 916  09/10/2019 1,014  03/11/2019 968   Lab Results  Component Value Date   HAV REACTIVE (A) 05/22/2017   Lab Results  Component Value Date   HEPBSAG NON-REACTIVE 05/22/2017   HEPBSAB REACTIVE (A) 03/11/2019    Problem List Items Addressed This Visit       Unprioritized   Asymptomatic HIV infection ([place])    Doing very well on Cabenuva injections. Finds them a huge win for adherence and very convenient for schedule.  VLs have all been suppressed and no concern with continuing these for [name].  Tetanus updated today. Needs Prevnar 20 in winter to complete pneumococcal series. Influenza recommended Sept/October.  LFTs unremarkable - continue with q1-2 times a year check.   RTC as scheduled with pharmacy on 6/20 for cabenuva injection and August dose. Will plan to see him back in either October / November for a visit with me.         Janene Madeira, MSN, NP-C [facility] for Infectious Disease [email or web address] Pager: [phone] Office: [place]: [phone]   01/31/22

## 2022-02-15 ENCOUNTER — Encounter: Payer: Medicaid Other | Admitting: Pharmacist

## 2022-02-17 ENCOUNTER — Encounter: Payer: Medicaid Other | Admitting: Pharmacist

## 2022-02-18 ENCOUNTER — Other Ambulatory Visit: Payer: Self-pay

## 2022-02-18 ENCOUNTER — Ambulatory Visit (INDEPENDENT_AMBULATORY_CARE_PROVIDER_SITE_OTHER): Payer: Medicaid Other | Admitting: Pharmacist

## 2022-02-18 DIAGNOSIS — Z21 Asymptomatic human immunodeficiency virus [HIV] infection status: Secondary | ICD-10-CM | POA: Diagnosis not present

## 2022-02-18 MED ORDER — CABOTEGRAVIR & RILPIVIRINE ER 600 & 900 MG/3ML IM SUER
1.0000 | Freq: Once | INTRAMUSCULAR | Status: AC
  Administered 2022-02-18: 1 via INTRAMUSCULAR

## 2022-02-18 NOTE — Progress Notes (Signed)
HPI: Gerald Lee is a 26 y.o. male who presents to the [facility] pharmacy clinic for Gerald Lee.  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   CABOTEGRAVIR & RILPIVIRINE ER (CABENUVA) 600 & 900 MG/3ML INJECTION    Inject 1 kit into the muscle every 2 (two) months.   CHOLECALCIFEROL 25 MCG (1000 UT) TABLET    Take by mouth.   DOXYCYCLINE (VIBRAMYCIN) 100 MG CAPSULE    Take 1 capsule (100 mg total) by mouth 2 (two) times daily. One po bid x 7 days   ENSURE (ENSURE)    Take 237 mLs by mouth 2 (two) times daily between meals.   LORATADINE (CLARITIN) 10 MG TABLET    Take 10 mg by mouth daily.   METHOCARBAMOL (ROBAXIN) 500 MG TABLET    Take 500 mg by mouth 3 (three) times daily.   OSELTAMIVIR (TAMIFLU) 75 MG CAPSULE    Take 1 capsule (75 mg total) by mouth every 12 (twelve) hours.   VITAMIN D, ERGOCALCIFEROL, (DRISDOL) 1.25 MG (50000 UNIT) CAPS CAPSULE    Take 50,000 Units by mouth once a week.  Modified Medications   No medications on file  Discontinued Medications   No medications on file    Allergies: Allergies  Allergen Reactions   Ibuprofen Other (See Comments)    Per patient "can not take because it will shut down my kidneys"    Past Medical History: Past Medical History:  Diagnosis Date   Asthma    Headache(784.0)    HIV (human immunodeficiency virus infection) (HCC)    HIV infection (HCC)    Orthostatic  hypotension     Social History: Social History   Socioeconomic History   Marital status: Single    Spouse name: Not on file   Number of children: Not on file   Years of education: Not on file   Highest education level: Not on file  Occupational History   Not on file  Tobacco Use   Smoking status: Former    Packs/day: 0.10    Types: Cigarettes, Cigars    Quit date: 11/29/2019    Years since quitting: 2.2   Smokeless tobacco: Never   Tobacco comments:    "only when super stressed"  Vaping Use   Vaping Use: Some days  Substance and Sexual Activity   Alcohol use: Yes    Comment: occ; drink daily   Drug use: Yes    Frequency: 7.0 times per week    Types: Marijuana    Comment: occassionally   Sexual activity: Yes    Partners: Male    Birth control/protection: Condom    Comment: declined condoms  Other Topics Concern   Not on file  Social History Narrative   Not on file   Social Determinants of Health   Financial Resource Strain: Not on file  Food Insecurity: Not on file  Transportation Needs: Not on file  Physical Activity: Not on file  Stress:  Not on file  Social Connections: Not on file    Labs: Lab Results  Component Value Date   HIV1RNAQUANT NOT DETECTED 12/14/2021   HIV1RNAQUANT Not Detected 08/19/2021   HIV1RNAQUANT Not Detected 04/27/2021   CD4TABS 916 08/19/2021   CD4TABS 1,014 09/10/2019   CD4TABS 968 03/11/2019    RPR and STI Lab Results  Component Value Date   LABRPR NON-REACTIVE 12/14/2021   LABRPR REACTIVE (A) 04/27/2021   LABRPR REACTIVE (A) 02/17/2021   LABRPR REACTIVE (A) 12/10/2020   LABRPR REACTIVE (A) 10/16/2019   RPRTITER 1:2 (H) 04/27/2021   RPRTITER 1:16 (H) 02/17/2021   RPRTITER 1:16 (H) 12/10/2020   RPRTITER 1:128 (H) 10/16/2019   RPRTITER 1:16 (H) 09/10/2019    STI Results GC CT  04/27/2021  3:11 PM Positive    Positive    Negative  Negative    Negative    Negative   12/10/2020  3:36 PM Negative    Negative     Negative  Negative    Positive    Positive   10/16/2019 11:05 AM Negative  Negative   05/11/2019 12:00 AM **POSITIVE**  C **POSITIVE**  C  03/11/2019 12:00 AM Negative  Negative   01/06/2019 12:00 AM Negative  Negative   10/01/2018 12:00 AM **POSITIVE**  Negative   06/11/2018 12:00 AM Negative    Negative    Negative  Negative    Negative    Negative   02/16/2018 12:00 AM Negative  Negative   10/13/2017 12:00 AM Negative  Negative   09/11/2017 12:00 AM Negative  Negative   05/22/2017 12:00 AM Negative    Negative  Negative    **POSITIVE**   05/17/2017 12:00 AM Negative  Negative     C Corrected result   Multiple values from one day are sorted in reverse-chronological order    Hepatitis B Lab Results  Component Value Date   HEPBSAB REACTIVE (A) 03/11/2019   HEPBSAG NON-REACTIVE 05/22/2017   Hepatitis C Lab Results  Component Value Date   HEPCAB NON-REACTIVE 05/22/2017   Hepatitis A Lab Results  Component Value Date   HAV REACTIVE (A) 05/22/2017   Lipids: Lab Results  Component Value Date   CHOL 119 12/14/2021   TRIG 60 12/14/2021   HDL 44 12/14/2021   CHOLHDL 2.7 12/14/2021   LDLCALC 61 12/14/2021    TARGET DATE:  The 22nd of the month  Current HIV Regimen: Cabenuva  Assessment: Gerald Lee presents today for their maintenance Cabenuva injections. Initial/past injections were tolerated well without issues. No issues with injections. Administered cabotegravir 600mg /52mL in left upper outer quadrant of the gluteal muscle. Administered rilpivirine 900 mg/70mL in the right upper outer quadrant of the gluteal muscle. Monitored patient for 10 minutes after injection. Injections were tolerated well without issue. Patient will follow up in 2 months for next injection. Will defer HIV RNA to next visit in August.  Patient presents with long-term partner today who is transferring HIV care with Gerald Lee next week. Deferred any condoms or STI screening today as they are  exclusive.  Plan: - Cabenuva injections administered - Next injections scheduled for 8/22 with me  - Call with any issues or questions  [name], PharmD, CPP Clinical Pharmacist Practitioner Infectious Diseases Clinical Pharmacist [facility]

## 2022-03-11 ENCOUNTER — Other Ambulatory Visit (HOSPITAL_COMMUNITY): Payer: Self-pay

## 2022-03-30 ENCOUNTER — Other Ambulatory Visit: Payer: Self-pay

## 2022-03-30 ENCOUNTER — Encounter: Payer: Self-pay | Admitting: Infectious Diseases

## 2022-03-30 ENCOUNTER — Ambulatory Visit (INDEPENDENT_AMBULATORY_CARE_PROVIDER_SITE_OTHER): Payer: Medicaid Other | Admitting: Infectious Diseases

## 2022-03-30 ENCOUNTER — Other Ambulatory Visit (HOSPITAL_COMMUNITY)
Admission: RE | Admit: 2022-03-30 | Discharge: 2022-03-30 | Disposition: A | Discharge status: Home / Self Care | Payer: Medicaid Other | Source: Ambulatory Visit | Attending: Infectious Diseases | Admitting: Infectious Diseases

## 2022-03-30 VITALS — BP 135/79 | HR 93 | Temp 98.2°F | Wt 294.0 lb

## 2022-03-30 DIAGNOSIS — A510 Primary genital syphilis: Secondary | ICD-10-CM | POA: Diagnosis not present

## 2022-03-30 MED ORDER — DOXYCYCLINE HYCLATE 100 MG PO TABS: 200.0000 mg | ORAL_TABLET | Freq: Once | ORAL | 0 refills | Status: DC | PRN

## 2022-03-30 MED ORDER — PENICILLIN G BENZATHINE 1200000 UNIT/2ML IM SUSY
1.2000 10*6.[IU] | PREFILLED_SYRINGE | Freq: Once | INTRAMUSCULAR | Status: AC
  Administered 2022-03-30: 1.2 10*6.[IU] via INTRAMUSCULAR

## 2022-03-30 NOTE — Assessment & Plan Note (Signed)
Lesions c/w primary syphilis chancres. Will treat with 2.4 million units bicillin today x 1. Treatment RPR today. Genital / extragenital swabs collected as well. Treatment pending results. Abstain from any partner sex for a week.   Discussed concept of initiation of Doxy-PEP for them to help reduce bacterial STI's Counseled to take TWO capsules/tabs ONCE within 24 hours of unprotected sexual encounter (or up to 72h after).

## 2022-03-30 NOTE — Progress Notes (Signed)
Subjective:     Gerald Lee is a 26 y.o. patient here today with concern over STI exposure/symptoms.    A week ago had a bump on the groin that he noticed. Pets are battling some fleas so not sure if this is related to that or not. Has noticed a smaller bump come up adjacently. Does not itch or drain. Has since turned into an ulcer with both spots. Non painful and without any drainage.    Review of Systems: Review of Systems  Constitutional:  Negative for chills and fever.  HENT:  Negative for sore throat.   Eyes:  Negative for visual disturbance.  Gastrointestinal:  Negative for abdominal pain, anal bleeding and rectal pain.  Genitourinary:  Positive for genital sores. Negative for dysuria, penile discharge, penile pain, scrotal swelling and testicular pain.  Musculoskeletal:  Negative for arthralgias and joint swelling.  Skin:  Negative for rash.  Neurological:  Negative for headaches.  Hematological:  Negative for adenopathy.    Sexual History:  Prefers male partners, oropharyngeal, rectal and penile sites exposed Condom use inconsistent  Well controlled HIV on cabenuva injections    Past Medical History:  Diagnosis Date   Asthma    Headache(784.0)    HIV (human immunodeficiency virus infection) ([place])    HIV infection ([place])    Orthostatic hypotension     Outpatient Medications Prior to Visit  Medication Sig Dispense Refill   cabotegravir & rilpivirine ER (CABENUVA) 600 & 900 MG/3ML injection Inject 1 kit into the muscle every 2 (two) months. 6 mL 5   acetaminophen (TYLENOL) 325 MG tablet Take by mouth. (Patient not taking: Reported on 01/31/2022)     Cholecalciferol 25 MCG (1000 UT) tablet Take by mouth. (Patient not taking: Reported on 01/31/2022)     Ensure (ENSURE) Take 237 mLs by mouth 2 (two) times daily between meals. (Patient not taking: Reported on 01/31/2022) 237 mL 5   loratadine (CLARITIN) 10 MG tablet Take 10 mg by mouth daily. (Patient not taking:  Reported on 01/31/2022)     methocarbamol (ROBAXIN) 500 MG tablet Take 500 mg by mouth 3 (three) times daily. (Patient not taking: Reported on 01/31/2022)     oseltamivir (TAMIFLU) 75 MG capsule Take 1 capsule (75 mg total) by mouth every 12 (twelve) hours. (Patient not taking: Reported on 01/31/2022) 10 capsule 0   Vitamin D, Ergocalciferol, (DRISDOL) 1.25 MG (50000 UNIT) CAPS capsule Take 50,000 Units by mouth once a week. (Patient not taking: Reported on 01/31/2022)     doxycycline (VIBRAMYCIN) 100 MG capsule Take 1 capsule (100 mg total) by mouth 2 (two) times daily. One po bid x 7 days (Patient not taking: Reported on 01/31/2022) 14 capsule 0   No facility-administered medications prior to visit.    Allergies  Allergen Reactions   Ibuprofen Other (See Comments)    Per patient "can not take because it will shut down my kidneys"    Family History  Problem Relation Age of Onset   Stroke Mother    Hypertension Mother    Diabetes Father         Objective:  Objective  Vitals:   03/30/22 1354  BP: 135/79  Pulse: 93  Temp: 98.2 F (36.8 C)  SpO2: 97%   Body mass index is 35.79 kg/m.   Physical Exam  Physical Exam Vitals reviewed.  Constitutional:      Appearance: Normal appearance.  Cardiovascular:     Rate and Rhythm: Normal  rate and regular rhythm.  Abdominal:     General: Bowel sounds are normal. There is no distension.     Palpations: Abdomen is soft.  Genitourinary:    Comments: Two small < 1cm ulcers noted on left scrotum Skin:    General: Skin is warm and dry.  Neurological:     Mental Status: He is alert and oriented to person, place, and time.          Assessment & Plan:    Problem List Items Addressed This Visit       Unprioritized   Primary syphilis - Primary    Lesions c/w primary syphilis chancres. Will treat with 2.4 million units bicillin today x 1. Treatment RPR today. Genital / extragenital swabs collected as well. Treatment pending results.  Abstain from any partner sex for a week.   Discussed concept of initiation of Doxy-PEP for them to help reduce bacterial STI's Counseled to take TWO capsules/tabs ONCE within 24 hours of unprotected sexual encounter (or up to 72h after).       Relevant Medications   doxycycline (VIBRA-TABS) 100 MG tablet   Other Relevant Orders   Cytology (oral, anal, urethral) ancillary only   Cytology (oral, anal, urethral) ancillary only   Urine cytology ancillary only   RPR   Janene Madeira, MSN, NP-C [facility] for [place] [facility] Office: [phone] Pager: [phone]

## 2022-03-30 NOTE — Patient Instructions (Signed)
   I would like to try a new way to prevent sexually transmitted infections for you with DOXY-PEP.  This has been shown to reduce syphilis and chlamydia re-infections by up to 70% in some studies.   How to take your antibiotic? Take TWO tablets (200 mg) of Doxycycline ONCE with food and a full glass of water within 24 hours (no later than 72 hours) after condomless sex.

## 2022-03-31 LAB — CYTOLOGY, (ORAL, ANAL, URETHRAL) ANCILLARY ONLY
Chlamydia: NEGATIVE
Chlamydia: NEGATIVE
Comment: NEGATIVE
Comment: NEGATIVE
Comment: NORMAL
Comment: NORMAL
Neisseria Gonorrhea: NEGATIVE
Neisseria Gonorrhea: NEGATIVE

## 2022-03-31 LAB — URINE CYTOLOGY ANCILLARY ONLY
Chlamydia: NEGATIVE
Comment: NEGATIVE
Comment: NORMAL
Neisseria Gonorrhea: NEGATIVE

## 2022-04-01 LAB — RPR: RPR Ser Ql: REACTIVE — AB

## 2022-04-01 LAB — RPR TITER: RPR Titer: 1:32 {titer} — ABNORMAL HIGH

## 2022-04-01 LAB — FLUORESCENT TREPONEMAL AB(FTA)-IGG-BLD: Fluorescent Treponemal ABS: REACTIVE — AB

## 2022-04-07 ENCOUNTER — Other Ambulatory Visit (HOSPITAL_COMMUNITY): Payer: Self-pay

## 2022-04-08 ENCOUNTER — Telehealth: Payer: Self-pay

## 2022-04-08 NOTE — Telephone Encounter (Signed)
[facility] Patient Advocate Encounter  Patient's medication Renaldo Harrison) have been couriered to [facility] from [employer] and will be administered on the patient next office visit on 04/19/22.  [name] , CPhT Specialty Pharmacy Patient [facility] for Infectious Disease Phone: [phone] Fax:  [phone]

## 2022-04-18 ENCOUNTER — Ambulatory Visit: Payer: Medicaid Other | Admitting: Infectious Diseases

## 2022-04-19 ENCOUNTER — Other Ambulatory Visit: Payer: Self-pay

## 2022-04-19 ENCOUNTER — Encounter: Payer: Medicaid Other | Admitting: Pharmacist

## 2022-04-19 ENCOUNTER — Ambulatory Visit (INDEPENDENT_AMBULATORY_CARE_PROVIDER_SITE_OTHER): Payer: Medicaid Other | Admitting: Pharmacist

## 2022-04-19 DIAGNOSIS — Z21 Asymptomatic human immunodeficiency virus [HIV] infection status: Secondary | ICD-10-CM

## 2022-04-19 MED ORDER — CABOTEGRAVIR & RILPIVIRINE ER 600 & 900 MG/3ML IM SUER
1.0000 | Freq: Once | INTRAMUSCULAR | Status: AC
  Administered 2022-04-19: 1 via INTRAMUSCULAR

## 2022-04-19 NOTE — Progress Notes (Signed)
HPI: Gerald Lee is a 26 y.o. male who presents to the [place] clinic for [place] administration.  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   CABOTEGRAVIR & RILPIVIRINE ER (CABENUVA) 600 & 900 MG/3ML INJECTION    Inject 1 kit into the muscle every 2 (two) months.   CHOLECALCIFEROL 25 MCG (1000 UT) TABLET    Take by mouth.   DOXYCYCLINE (VIBRA-TABS) 100 MG TABLET    Take 2 tablets (200 mg total) by mouth once as needed for up to 1 dose. Within 24 hours of at risk activity   ENSURE (ENSURE)    Take 237 mLs by mouth 2 (two) times daily between meals.   LORATADINE (CLARITIN) 10 MG TABLET    Take 10 mg by mouth daily.   METHOCARBAMOL (ROBAXIN) 500 MG TABLET    Take 500 mg by mouth 3 (three) times daily.   OSELTAMIVIR (TAMIFLU) 75 MG CAPSULE    Take 1 capsule (75 mg total) by mouth every 12 (twelve) hours.   VITAMIN D, ERGOCALCIFEROL, (DRISDOL) 1.25 MG (50000 UNIT) CAPS CAPSULE    Take 50,000 Units by mouth once a week.  Modified Medications   No medications on file  Discontinued Medications   No medications on file    Allergies: Allergies  Allergen Reactions   Ibuprofen Other (See Comments)    Per patient "can not take because it will shut down my kidneys"    Past Medical History: Past Medical History:  Diagnosis Date   Asthma    Headache(784.0)    HIV (human immunodeficiency virus infection) ([place])    HIV infection  ([place])    Orthostatic hypotension     Social History: Social History   Socioeconomic History   Marital status: Single    Spouse name: Not on file   Number of children: Not on file   Years of education: Not on file   Highest education level: Not on file  Occupational History   Not on file  Tobacco Use   Smoking status: Former    Packs/day: 0.10    Types: Cigarettes, Cigars    Quit date: 11/29/2019    Years since quitting: 2.3   Smokeless tobacco: Never   Tobacco comments:    "only when super stressed"  Vaping Use   Vaping Use: Some days  Substance and Sexual Activity   Alcohol use: Yes    Comment: occ; drink daily   Drug use: Yes    Frequency: 7.0 times per week    Types: Marijuana    Comment: occassionally   Sexual activity: Yes    Partners: Male    Birth control/protection: Condom    Comment: declined condoms  Other Topics Concern   Not on file  Social History Narrative   Not on file   Social Determinants of Health   Financial Resource Strain: Not on file  Food Insecurity: Not on file  Transportation Needs: Not on file  Physical Activity: Not  on file  Stress: Not on file  Social Connections: Not on file    Labs: Lab Results  Component Value Date   HIV1RNAQUANT NOT DETECTED 12/14/2021   HIV1RNAQUANT Not Detected 08/19/2021   HIV1RNAQUANT Not Detected 04/27/2021   CD4TABS 916 08/19/2021   CD4TABS 1,014 09/10/2019   CD4TABS 968 03/11/2019    RPR and STI Lab Results  Component Value Date   LABRPR REACTIVE (A) 03/30/2022   LABRPR NON-REACTIVE 12/14/2021   LABRPR REACTIVE (A) 04/27/2021   LABRPR REACTIVE (A) 02/17/2021   LABRPR REACTIVE (A) 12/10/2020   RPRTITER 1:32 (H) 03/30/2022   RPRTITER 1:2 (H) 04/27/2021   RPRTITER 1:16 (H) 02/17/2021   RPRTITER 1:16 (H) 12/10/2020   RPRTITER 1:128 (H) 10/16/2019    STI Results GC CT  03/30/2022  1:57 PM Negative    Negative    Negative  Negative    Negative    Negative   04/27/2021  3:11 PM Positive     Positive    Negative  Negative    Negative    Negative   12/10/2020  3:36 PM Negative    Negative    Negative  Negative    Positive    Positive   10/16/2019 11:05 AM Negative  Negative   05/11/2019 12:00 AM **POSITIVE**  C **POSITIVE**  C  03/11/2019 12:00 AM Negative  Negative   01/06/2019 12:00 AM Negative  Negative   10/01/2018 12:00 AM **POSITIVE**  Negative   06/11/2018 12:00 AM Negative    Negative    Negative  Negative    Negative    Negative   02/16/2018 12:00 AM Negative  Negative   10/13/2017 12:00 AM Negative  Negative   09/11/2017 12:00 AM Negative  Negative   05/22/2017 12:00 AM Negative    Negative  Negative    **POSITIVE**   05/17/2017 12:00 AM Negative  Negative     C Corrected result   Multiple values from one day are sorted in reverse-chronological order    Hepatitis B Lab Results  Component Value Date   HEPBSAB REACTIVE (A) 03/11/2019   HEPBSAG NON-REACTIVE 05/22/2017   Hepatitis C Lab Results  Component Value Date   HEPCAB NON-REACTIVE 05/22/2017   Hepatitis A Lab Results  Component Value Date   HAV REACTIVE (A) 05/22/2017   Lipids: Lab Results  Component Value Date   CHOL 119 12/14/2021   TRIG 60 12/14/2021   HDL 44 12/14/2021   CHOLHDL 2.7 12/14/2021   LDLCALC 61 12/14/2021    TARGET DATE:  The 22nd of the month  Current HIV Regimen: Cabenuva  Assessment: Gerald Lee presents today for their maintenance Cabenuva injections. Initial/past injections were tolerated well without issues. No problems with systemic effects of injections. Administered cabotegravir 650m/3mL in left upper outer quadrant of the gluteal muscle. Administered rilpivirine 900 mg/340min the right upper outer quadrant of the gluteal muscle. Monitored patient for 10 minutes after injection. Injections were tolerated well without issue. Patient will follow up in 2 months for next injection. Will check HIV RNA. Patient wanted to reschedule getting labs until later this  week, so they he will return some time before Friday to have HIV RNA drawn. Future lab ordered.  Gerald Lee also presented earlier this month for syphilis treatment; denies any further symptoms. Will recheck RPR in 6 months. Denies need for doxyPEP at this time stating he is only sexually active with his one partner.   Plan: - Cabenuva injections administered - Check HIV RNA today  -  Next injections scheduled for 10/16 with Colletta Maryland and 12/19 with me  - Call with any issues or questions  Alfonse Spruce, PharmD, CPP, [place] Clinical Pharmacist Practitioner [place] for Infectious Disease

## 2022-05-31 ENCOUNTER — Encounter: Payer: Self-pay | Admitting: Infectious Diseases

## 2022-05-31 ENCOUNTER — Other Ambulatory Visit (HOSPITAL_COMMUNITY): Payer: Self-pay

## 2022-05-31 ENCOUNTER — Telehealth: Payer: Self-pay

## 2022-05-31 NOTE — Telephone Encounter (Signed)
[facility] Patient Advocate Encounter  Completed and sent [employer] application for Cabenuva for this patient who is uninsured.    Patient assistance phone number for follow up is [phone].   This encounter will be updated until final determination.   Ileene Patrick, [place] [facility] Patient [facility] for Infectious Disease Phone: [phone] Fax:  [phone]

## 2022-06-01 ENCOUNTER — Other Ambulatory Visit (HOSPITAL_COMMUNITY): Payer: Self-pay

## 2022-06-06 ENCOUNTER — Telehealth: Payer: Self-pay

## 2022-06-06 NOTE — Telephone Encounter (Signed)
[facility] Patient Advocate Encounter  Completed and sent [employer] Patient Assistance Program application for [place] for this patient who is uninsured.    Patient is approved 06/03/22 through 06/02/23.  Patient ID # [id number]  Medication will be mailed to the clinic.   Ileene Patrick, [facility] Patient [facility] for Infectious Disease Phone: [phone] Fax:  [phone]

## 2022-06-13 ENCOUNTER — Encounter: Payer: Self-pay | Admitting: Infectious Diseases

## 2022-06-13 ENCOUNTER — Telehealth: Payer: Self-pay

## 2022-06-13 NOTE — Telephone Encounter (Signed)
Attempted to reach patient regarding No Show today 06/13/22- [employer] Date Cabenuva is 06/19/22. Left [facility] compliant voicemail. Also advised [facility] of missed appt.

## 2022-06-16 ENCOUNTER — Encounter: Payer: Self-pay | Admitting: Infectious Diseases

## 2022-06-16 ENCOUNTER — Other Ambulatory Visit: Payer: Self-pay

## 2022-06-16 ENCOUNTER — Ambulatory Visit (INDEPENDENT_AMBULATORY_CARE_PROVIDER_SITE_OTHER): Payer: Self-pay | Admitting: Infectious Diseases

## 2022-06-16 VITALS — BP 113/74 | HR 76 | Resp 16 | Ht 76.0 in | Wt 291.0 lb

## 2022-06-16 DIAGNOSIS — Z21 Asymptomatic human immunodeficiency virus [HIV] infection status: Secondary | ICD-10-CM

## 2022-06-16 DIAGNOSIS — Z23 Encounter for immunization: Secondary | ICD-10-CM

## 2022-06-16 DIAGNOSIS — Z0000 Encounter for general adult medical examination without abnormal findings: Secondary | ICD-10-CM

## 2022-06-16 DIAGNOSIS — A510 Primary genital syphilis: Secondary | ICD-10-CM

## 2022-06-16 MED ORDER — CABOTEGRAVIR & RILPIVIRINE ER 600 & 900 MG/3ML IM SUER
1.0000 | Freq: Once | INTRAMUSCULAR | Status: AC
  Administered 2022-06-16: 1 via INTRAMUSCULAR

## 2022-06-16 NOTE — Progress Notes (Signed)
Name: Gerald Lee  DOB: [birth year]  MRN: [id number] PCP: [name], MD    Brief Narrative:  Gerald Lee is a 26 y.o. AA male with well controlled HIV, Dx 04-2017 with CD4 nadir 780.  Hep B sAg (-) High Risk: MSM.  Previous OIs: none  Previous Regimens:  Biktarvy 2018 --> suppressed  Cabenuva injections   Genotype:  02/2017 - no mutations   Subjective:   CC: Cabenuva injection and routine HIV follow-up   HPI:  Gerald Lee is doing well.  Tired and has had interrupted sleep.  Missed his cavity of the injection earlier this week but no treatments have been given outside of his window from [employer] date.  No concerns or complaints related to his medication.  Previous genital ulcer resolved after treatment for syphilis. Has been using doxy pep for bacterial STI prevention and feels like it is working well for him.     Review of Systems  Constitutional:  Negative for chills and fever.  HENT:  Negative for tinnitus.   Eyes:  Negative for blurred vision and photophobia.  Respiratory:  Negative for cough and sputum production.   Cardiovascular:  Negative for chest pain.  Gastrointestinal:  Negative for diarrhea, nausea and vomiting.  Genitourinary:  Negative for dysuria.  Skin:  Negative for rash.  Neurological:  Negative for headaches.     Past Medical History:  Diagnosis Date   Asthma    Headache(784.0)    HIV (human immunodeficiency virus infection) (HCC)    HIV infection (HCC)    Orthostatic hypotension     Social History   Tobacco Use   Smoking status: Former    Packs/day: 0.10    Types: Cigarettes, Cigars    Quit date: 11/29/2019    Years since quitting: 2.5   Smokeless tobacco: Never   Tobacco comments:    "only when super stressed"  Vaping Use   Vaping Use: Some days  Substance Use Topics   Alcohol use: Yes    Comment: occ; drink daily   Drug use: Yes    Frequency: 7.0 times per week    Types: Marijuana    Comment: occassionally    Allergies   Allergen Reactions   Ibuprofen Other (See Comments)    Per patient "can not take because it will shut down my kidneys"    Objective:  Vitals:   06/16/22 1612  BP: 113/74  Pulse: 76  Resp: 16  SpO2: 97%  Weight: 291 lb (132 kg)  Height: 6\' 4"  (1.93 m)    Body mass index is 35.42 kg/m.  Physical Exam Constitutional:      Appearance: He is well-developed.     Comments: Seated comfortably in chair during visit.   HENT:     Mouth/Throat:     Dentition: Normal dentition. No dental abscesses.  Cardiovascular:     Rate and Rhythm: Normal rate and regular rhythm.     Heart sounds: Normal heart sounds.  Pulmonary:     Effort: Pulmonary effort is normal.     Breath sounds: Normal breath sounds.  Abdominal:     General: There is no distension.     Palpations: Abdomen is soft.     Tenderness: There is no abdominal tenderness.  Lymphadenopathy:     Cervical: No cervical adenopathy.  Skin:    General: Skin is warm and dry.     Findings: No rash.  Neurological:     Mental Status: He is alert and oriented to person, place,  and time.  Psychiatric:        Judgment: Judgment normal.     Comments: In good spirits today and engaged in care discussion.       Lab Results Lab Results  Component Value Date   WBC 11.6 (H) 10/07/2021   HGB 13.6 10/07/2021   HCT 41.8 10/07/2021   MCV 88.0 10/07/2021   PLT 291 10/07/2021    Lab Results  Component Value Date   CREATININE 1.17 10/07/2021   BUN 16 10/07/2021   NA 138 10/07/2021   K 3.7 10/07/2021   CL 104 10/07/2021   CO2 24 10/07/2021    Lab Results  Component Value Date   ALT 15 06/16/2022   AST 14 06/16/2022   ALKPHOS 123 10/30/2020   BILITOT 0.9 06/16/2022    Lab Results  Component Value Date   CHOL 119 12/14/2021   HDL 44 12/14/2021   LDLCALC 61 12/14/2021   TRIG 60 12/14/2021   CHOLHDL 2.7 12/14/2021   HIV 1 RNA Quant  Date Value  12/14/2021 NOT DETECTED copies/mL  08/19/2021 Not Detected Copies/mL   04/27/2021 Not Detected Copies/mL   CD4 T Cell Abs (/uL)  Date Value  06/16/2022 959  08/19/2021 916  09/10/2019 1,014   Lab Results  Component Value Date   HAV REACTIVE (A) 05/22/2017   Lab Results  Component Value Date   HEPBSAG NON-REACTIVE 05/22/2017   HEPBSAB REACTIVE (A) 03/11/2019    Problem List Items Addressed This Visit       Unprioritized   Asymptomatic HIV infection ([place]) - Primary    Very well controlled on long-acting injectable Cabenuva every 2 months. No concerns with access or adherence to medication. They are tolerating the medication well without side effects. No drug interactions identified.  Pertinent lab tests ordered today including a viral load, CD4 and LFT for therapeutic monitoring. Reminded about ADAP re-enrollment in July / January.  No dental needs today.  No concern over anxious/depressed mood.  Sexual health and family planning discussed -recheck RPR titer.  Declined asymptomatic screening today.  They will continue on Doxy PEP.  Vaccines updated today - see health maintenance section.   Gerald Lee will continue to work with our pharmacist team for subsequent injections.  I will plan to see him back in 6 months time.       Relevant Orders   HIV 1 RNA quant-no reflex-bld   T-helper cells (CD4) count (Completed)   RPR   Hepatic function panel (Completed)   Healthcare maintenance    Influenza vaccine provided today.      Primary syphilis    Symptoms have resolved.  Repeat titer today.  Continue Doxy PEP      Other Visit Diagnoses     Need for immunization against influenza       Relevant Orders   Flu Vaccine QUAD 26mo+IM (Fluarix, Fluzone & Alfiuria Quad PF) (Completed)       Janene Madeira, MSN, NP-C [place] for Infectious Disease [email or web address] Pager: [phone] Office: [place]: [phone]   06/17/22

## 2022-06-17 LAB — T-HELPER CELLS (CD4) COUNT (NOT AT ARMC)
CD4 % Helper T Cell: 37 % (ref 33–65)
CD4 T Cell Abs: 959 /uL (ref 400–1790)

## 2022-06-17 NOTE — Assessment & Plan Note (Signed)
Symptoms have resolved.  Repeat titer today.  Continue Doxy PEP

## 2022-06-17 NOTE — Assessment & Plan Note (Signed)
Influenza vaccine provided today.

## 2022-06-17 NOTE — Assessment & Plan Note (Signed)
Very well controlled on long-acting injectable Cabenuva every 2 months. No concerns with access or adherence to medication. They are tolerating the medication well without side effects. No drug interactions identified.  Pertinent lab tests ordered today including a viral load, CD4 and LFT for therapeutic monitoring. Reminded about ADAP re-enrollment in July / January.  No dental needs today.  No concern over anxious/depressed mood.  Sexual health and family planning discussed -recheck RPR titer.  Declined asymptomatic screening today.  They will continue on Doxy PEP.  Vaccines updated today - see health maintenance section.   [name] will continue to work with our pharmacist team for subsequent injections.  I will plan to see him back in 6 months time.

## 2022-06-20 LAB — HIV-1 RNA QUANT-NO REFLEX-BLD
HIV 1 RNA Quant: NOT DETECTED Copies/mL
HIV-1 RNA Quant, Log: NOT DETECTED Log cps/mL

## 2022-06-20 LAB — HEPATIC FUNCTION PANEL
AG Ratio: 1.8 (calc) (ref 1.0–2.5)
ALT: 15 U/L (ref 9–46)
AST: 14 U/L (ref 10–40)
Albumin: 4.4 g/dL (ref 3.6–5.1)
Alkaline phosphatase (APISO): 106 U/L (ref 36–130)
Bilirubin, Direct: 0.2 mg/dL (ref 0.0–0.2)
Globulin: 2.4 g/dL (calc) (ref 1.9–3.7)
Indirect Bilirubin: 0.7 mg/dL (calc) (ref 0.2–1.2)
Total Bilirubin: 0.9 mg/dL (ref 0.2–1.2)
Total Protein: 6.8 g/dL (ref 6.1–8.1)

## 2022-06-20 LAB — FLUORESCENT TREPONEMAL AB(FTA)-IGG-BLD: Fluorescent Treponemal ABS: REACTIVE — AB

## 2022-06-20 LAB — RPR: RPR Ser Ql: REACTIVE — AB

## 2022-06-20 LAB — RPR TITER: RPR Titer: 1:16 {titer} — ABNORMAL HIGH

## 2022-07-07 ENCOUNTER — Telehealth: Payer: Self-pay

## 2022-07-07 NOTE — Telephone Encounter (Signed)
[facility] Patient Advocate Encounter  Patient's medication Renaldo Harrison) have been couriered to [facility] from [employer] and will be administered on the patient next office visit on 08/16/22.  [name] , CPhT Specialty Pharmacy Patient [facility] for Infectious Disease Phone: [phone] Fax:  [phone]

## 2022-08-02 ENCOUNTER — Other Ambulatory Visit: Payer: Self-pay | Admitting: Pharmacist

## 2022-08-02 DIAGNOSIS — Z21 Asymptomatic human immunodeficiency virus [HIV] infection status: Secondary | ICD-10-CM

## 2022-08-02 MED ORDER — CABENUVA 600 & 900 MG/3ML IM SUER: 1.0000 | INTRAMUSCULAR | 5 refills | Status: DC

## 2022-08-16 ENCOUNTER — Encounter: Payer: Self-pay | Admitting: Pharmacist

## 2022-08-18 ENCOUNTER — Encounter: Payer: Self-pay | Admitting: Pharmacist

## 2022-08-25 ENCOUNTER — Other Ambulatory Visit: Payer: Self-pay

## 2022-08-25 ENCOUNTER — Encounter: Payer: Self-pay | Admitting: Internal Medicine

## 2022-08-25 ENCOUNTER — Ambulatory Visit (INDEPENDENT_AMBULATORY_CARE_PROVIDER_SITE_OTHER): Payer: Self-pay | Admitting: Pharmacist

## 2022-08-25 DIAGNOSIS — Z21 Asymptomatic human immunodeficiency virus [HIV] infection status: Secondary | ICD-10-CM

## 2022-08-25 MED ORDER — CABOTEGRAVIR & RILPIVIRINE ER 600 & 900 MG/3ML IM SUER
1.0000 | Freq: Once | INTRAMUSCULAR | Status: AC
  Administered 2022-08-25: 1 via INTRAMUSCULAR

## 2022-08-25 NOTE — Progress Notes (Signed)
HPI: Gerald Lee is a 26 y.o. male who presents to the [facility] for [place] administration.  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   CABOTEGRAVIR & RILPIVIRINE ER (CABENUVA) 600 & 900 MG/3ML INJECTION    Inject 1 kit into the muscle every 2 (two) months.   CHOLECALCIFEROL 25 MCG (1000 UT) TABLET    Take by mouth.   DOXYCYCLINE (VIBRA-TABS) 100 MG TABLET    Take 2 tablets (200 mg total) by mouth once as needed for up to 1 dose. Within 24 hours of at risk activity   ENSURE (ENSURE)    Take 237 mLs by mouth 2 (two) times daily between meals.   LORATADINE (CLARITIN) 10 MG TABLET    Take 10 mg by mouth daily.   METHOCARBAMOL (ROBAXIN) 500 MG TABLET    Take 500 mg by mouth 3 (three) times daily.   OSELTAMIVIR (TAMIFLU) 75 MG CAPSULE    Take 1 capsule (75 mg total) by mouth every 12 (twelve) hours.   VITAMIN D, ERGOCALCIFEROL, (DRISDOL) 1.25 MG (50000 UNIT) CAPS CAPSULE    Take 50,000 Units by mouth once a week.  Modified Medications   No medications on file  Discontinued Medications   No medications on file    Allergies: Allergies  Allergen Reactions   Ibuprofen Other (See Comments)    Per patient "can not take because it will shut down my kidneys"    Past Medical History: Past Medical History:  Diagnosis Date   Asthma    Headache(784.0)    HIV (human immunodeficiency virus infection) ([place])    HIV  infection ([place])    Orthostatic hypotension     Social History: Social History   Socioeconomic History   Marital status: Single    Spouse name: Not on file   Number of children: Not on file   Years of education: Not on file   Highest education level: Not on file  Occupational History   Not on file  Tobacco Use   Smoking status: Former    Packs/day: 0.10    Types: Cigarettes, Cigars    Quit date: 11/29/2019    Years since quitting: 2.7   Smokeless tobacco: Never   Tobacco comments:    "only when super stressed"  Vaping Use   Vaping Use: Some days  Substance and Sexual Activity   Alcohol use: Yes    Comment: occ; drink daily   Drug use: Yes    Frequency: 7.0 times per week    Types: Marijuana    Comment: occassionally   Sexual activity: Yes    Partners: Male    Birth control/protection: Condom    Comment: declined condoms  Other Topics Concern   Not on file  Social History Narrative   Not on file   Social Determinants of Health   Financial Resource Strain: Not on file  Food Insecurity: Not on file  Transportation Needs: Not on file  Physical Activity:  Not on file  Stress: Not on file  Social Connections: Not on file    Labs: Lab Results  Component Value Date   HIV1RNAQUANT Not Detected 06/16/2022   HIV1RNAQUANT NOT DETECTED 12/14/2021   HIV1RNAQUANT Not Detected 08/19/2021   CD4TABS 959 06/16/2022   CD4TABS 916 08/19/2021   CD4TABS 1,014 09/10/2019    RPR and STI Lab Results  Component Value Date   LABRPR REACTIVE (A) 06/16/2022   LABRPR REACTIVE (A) 03/30/2022   LABRPR NON-REACTIVE 12/14/2021   LABRPR REACTIVE (A) 04/27/2021   LABRPR REACTIVE (A) 02/17/2021   RPRTITER 1:16 (H) 06/16/2022   RPRTITER 1:32 (H) 03/30/2022   RPRTITER 1:2 (H) 04/27/2021   RPRTITER 1:16 (H) 02/17/2021   RPRTITER 1:16 (H) 12/10/2020    STI Results GC CT  03/30/2022  1:57 PM Negative    Negative    Negative  Negative    Negative    Negative   04/27/2021  3:11 PM  Positive    Positive    Negative  Negative    Negative    Negative   12/10/2020  3:36 PM Negative    Negative    Negative  Negative    Positive    Positive   10/16/2019 11:05 AM Negative  Negative   05/11/2019 12:00 AM **POSITIVE**  C **POSITIVE**  C  03/11/2019 12:00 AM Negative  Negative   01/06/2019 12:00 AM Negative  Negative   10/01/2018 12:00 AM **POSITIVE**  Negative   06/11/2018 12:00 AM Negative    Negative    Negative  Negative    Negative    Negative   02/16/2018 12:00 AM Negative  Negative   10/13/2017 12:00 AM Negative  Negative   09/11/2017 12:00 AM Negative  Negative   05/22/2017 12:00 AM Negative    Negative  Negative    **POSITIVE**   05/17/2017 12:00 AM Negative  Negative     C Corrected result   Multiple values from one day are sorted in reverse-chronological order    Hepatitis B Lab Results  Component Value Date   HEPBSAB REACTIVE (A) 03/11/2019   HEPBSAG NON-REACTIVE 05/22/2017   Hepatitis C Lab Results  Component Value Date   HEPCAB NON-REACTIVE 05/22/2017   Hepatitis A Lab Results  Component Value Date   HAV REACTIVE (A) 05/22/2017   Lipids: Lab Results  Component Value Date   CHOL 119 12/14/2021   TRIG 60 12/14/2021   HDL 44 12/14/2021   CHOLHDL 2.7 12/14/2021   LDLCALC 61 12/14/2021    [employer] DATE: The 22nd  Assessment: Gerald Lee presents today for his maintenance Cabenuva injections. Past injections were tolerated well without issues.  Administered cabotegravir 664m/3mL in left upper outer quadrant of the gluteal muscle. Administered rilpivirine 900 mg/324min the right upper outer quadrant of the gluteal muscle. No issues with injections. He will follow up in 2 months for next set of injections.  Plan: - Cabenuva injections administered - Next injections scheduled for 10/13/22 with [place] [place] 12/15/22 with me - Call with any issues or questions  Ellayna Hilligoss L. Rylynn Schoneman, PharmD, BCIDP, AAHIVP, CPWhighamlinical Pharmacist  Practitioner [facility] Infectious [facility]

## 2022-09-06 ENCOUNTER — Other Ambulatory Visit (HOSPITAL_COMMUNITY): Payer: Self-pay

## 2022-09-26 ENCOUNTER — Other Ambulatory Visit (HOSPITAL_COMMUNITY): Payer: Self-pay

## 2022-09-29 ENCOUNTER — Telehealth: Payer: Self-pay

## 2022-09-29 NOTE — Telephone Encounter (Signed)
[facility] Patient Advocate Encounter  Patient's medication Kern Reap) have been couriered to [facility] from [employer] and will be administered on the patient next office visit on 10/14/22.  Ileene Patrick , [place] [facility] Patient [facility] for Infectious Disease Phone: [phone] Fax:  [phone]

## 2022-10-13 ENCOUNTER — Other Ambulatory Visit: Payer: Self-pay

## 2022-10-13 ENCOUNTER — Ambulatory Visit (INDEPENDENT_AMBULATORY_CARE_PROVIDER_SITE_OTHER): Payer: Self-pay | Admitting: Infectious Diseases

## 2022-10-13 ENCOUNTER — Encounter: Payer: Self-pay | Admitting: Infectious Diseases

## 2022-10-13 VITALS — BP 122/84 | HR 93 | Ht 75.0 in | Wt 301.0 lb

## 2022-10-13 DIAGNOSIS — Z21 Asymptomatic human immunodeficiency virus [HIV] infection status: Secondary | ICD-10-CM

## 2022-10-13 DIAGNOSIS — F32A Depression, unspecified: Secondary | ICD-10-CM

## 2022-10-13 MED ORDER — CABOTEGRAVIR & RILPIVIRINE ER 600 & 900 MG/3ML IM SUER
1.0000 | Freq: Once | INTRAMUSCULAR | Status: AC
  Administered 2022-10-13: 1 via INTRAMUSCULAR

## 2022-10-13 NOTE — Patient Instructions (Addendum)
For urgent evaluation - can go 24/7 to [facility] or [facility]  (listed below) Phone: [phone] Address: [address]  [place], [place] [address]  Beverly Sessions would be a consideration for you mental health needs Address: Restaurant manager, fast food at [facility], [address] [place], [place], [place] [address] Phone: [phone]  I will put in a referral for you for [facility] team - if you don't   12/15/2022 is your next appointment.

## 2022-10-13 NOTE — Progress Notes (Signed)
Name: Gerald Lee  DOB: Aug 01, 1996  MRN: [id number] PCP: Nolene Ebbs, MD    Brief Narrative:  Gerald Lee is a 27 y.o. AA male with well controlled HIV, Dx 04-2017 with CD4 nadir 780.  Hep B sAg (-) High Risk: MSM.  Previous OIs: none  Previous Regimens:  Biktarvy 2018 --> suppressed  Cabenuva injections   Genotype:  02/2017 - no mutations   Subjective:   CC: Cabenuva injection and routine HIV follow-up   HPI:  Here for cabenuva injection - no HIV related concerns No sexual health concerns.  Concerns about borderline personality disorder or anxiety/depression. He feels that the increased stress has made it difficult to control some of his symptoms. He is asking for formal psychiatry referral to help with diagnosis.     Review of Systems  Constitutional:  Negative for chills and fever.  HENT:  Negative for tinnitus.   Eyes:  Negative for blurred vision and photophobia.  Respiratory:  Negative for cough and sputum production.   Cardiovascular:  Negative for chest pain.  Gastrointestinal:  Negative for diarrhea, nausea and vomiting.  Genitourinary:  Negative for dysuria.  Skin:  Negative for rash.  Neurological:  Negative for headaches.     Past Medical History:  Diagnosis Date   Asthma    Headache(784.0)    HIV (human immunodeficiency virus infection) ([place])    HIV infection ([place])    Orthostatic hypotension     Social History   Tobacco Use   Smoking status: Former    Packs/day: 0.10    Types: Cigarettes, Cigars    Quit date: 11/29/2019    Years since quitting: 2.9   Smokeless tobacco: Never   Tobacco comments:    "only when super stressed"  Vaping Use   Vaping Use: Some days  Substance Use Topics   Alcohol use: Yes    Comment: occ; drink daily   Drug use: Yes    Frequency: 7.0 times per week    Types: Marijuana    Comment: occassionally    Allergies  Allergen Reactions   Ibuprofen Other (See Comments)    Per patient "can not take  because it will shut down my kidneys"    Objective:  Vitals:   10/13/22 1554  BP: 122/84  Pulse: 93  Weight: (!) 301 lb (136.5 kg)  Height: '6\' 3"'$  (1.905 m)    Body mass index is 37.62 kg/m.  Physical Exam Constitutional:      Appearance: He is well-developed.     Comments: Seated comfortably in chair during visit.   HENT:     Mouth/Throat:     Dentition: Normal dentition. No dental abscesses.  Cardiovascular:     Rate and Rhythm: Normal rate and regular rhythm.     Heart sounds: Normal heart sounds.  Pulmonary:     Effort: Pulmonary effort is normal.     Breath sounds: Normal breath sounds.  Abdominal:     General: There is no distension.     Palpations: Abdomen is soft.     Tenderness: There is no abdominal tenderness.  Lymphadenopathy:     Cervical: No cervical adenopathy.  Skin:    General: Skin is warm and dry.     Findings: No rash.  Neurological:     Mental Status: He is alert and oriented to person, place, and time.  Psychiatric:        Judgment: Judgment normal.     Comments: In good spirits today  and engaged in care discussion.       Lab Results Lab Results  Component Value Date   WBC 11.6 (H) 10/07/2021   HGB 13.6 10/07/2021   HCT 41.8 10/07/2021   MCV 88.0 10/07/2021   PLT 291 10/07/2021    Lab Results  Component Value Date   CREATININE 1.17 10/07/2021   BUN 16 10/07/2021   NA 138 10/07/2021   K 3.7 10/07/2021   CL 104 10/07/2021   CO2 24 10/07/2021    Lab Results  Component Value Date   ALT 15 06/16/2022   AST 14 06/16/2022   ALKPHOS 123 10/30/2020   BILITOT 0.9 06/16/2022    Lab Results  Component Value Date   CHOL 119 12/14/2021   HDL 44 12/14/2021   LDLCALC 61 12/14/2021   TRIG 60 12/14/2021   CHOLHDL 2.7 12/14/2021   HIV 1 RNA Quant  Date Value  06/16/2022 Not Detected Copies/mL  12/14/2021 NOT DETECTED copies/mL  08/19/2021 Not Detected Copies/mL   CD4 T Cell Abs (/uL)  Date Value  06/16/2022 959  08/19/2021 916   09/10/2019 1,014   Lab Results  Component Value Date   HAV REACTIVE (A) 05/22/2017   Lab Results  Component Value Date   HEPBSAG NON-REACTIVE 05/22/2017   HEPBSAB REACTIVE (A) 03/11/2019    Problem List Items Addressed This Visit       Unprioritized   Asymptomatic HIV infection ([place])    Cabenuva injection provided today without difficulty. Reviewed recent pertinent labs.  12/15/2022 next injection appt with our pharmacy team.       Relevant Orders   HIV 1 RNA quant-no reflex-bld   RPR   T-helper cells (CD4) count   Depression - Primary    Gerald Lee has concerns over potential borderline personality symptoms - increased agitation and anger reaction out of proportion to stimulus. Will refer to psychiatry for further specialty evaluation.       Relevant Orders   Ambulatory referral to Psychiatry   Janene Madeira, MSN, NP-C [place] for Infectious Disease [email or web address] Pager: [phone] Office: [place]: [phone]   10/31/22

## 2022-10-31 DIAGNOSIS — F32A Depression, unspecified: Secondary | ICD-10-CM | POA: Insufficient documentation

## 2022-10-31 NOTE — Assessment & Plan Note (Signed)
[name] has concerns over potential borderline personality symptoms - increased agitation and anger reaction out of proportion to stimulus. Will refer to psychiatry for further specialty evaluation.

## 2022-10-31 NOTE — Assessment & Plan Note (Signed)
Cabenuva injection provided today without difficulty. Reviewed recent pertinent labs.  12/15/2022 next injection appt with our pharmacy team.

## 2022-11-22 ENCOUNTER — Telehealth: Payer: Self-pay

## 2022-11-22 NOTE — Telephone Encounter (Signed)
[facility] Patient Advocate Encounter  Patient's medication Kern Reap) have been couriered to [facility] from [employer] and will be administered on the patient next office visit on 12/15/22.  Ileene Patrick , [place] [facility] Patient [facility] for Infectious Disease Phone: [phone] Fax:  [phone]

## 2022-11-28 DIAGNOSIS — E669 Obesity, unspecified: Secondary | ICD-10-CM

## 2022-12-02 ENCOUNTER — Encounter (HOSPITAL_COMMUNITY): Payer: Self-pay | Admitting: Licensed Clinical Social Worker

## 2022-12-02 ENCOUNTER — Ambulatory Visit (INDEPENDENT_AMBULATORY_CARE_PROVIDER_SITE_OTHER): Payer: No Payment, Other | Admitting: Licensed Clinical Social Worker

## 2022-12-02 DIAGNOSIS — F331 Major depressive disorder, recurrent, moderate: Secondary | ICD-10-CM

## 2022-12-02 DIAGNOSIS — F4310 Post-traumatic stress disorder, unspecified: Secondary | ICD-10-CM | POA: Diagnosis not present
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
## 2022-12-06 ENCOUNTER — Telehealth (HOSPITAL_COMMUNITY): Payer: Self-pay | Admitting: Professional

## 2022-12-09 ENCOUNTER — Telehealth (HOSPITAL_COMMUNITY): Payer: Self-pay | Admitting: Licensed Clinical Social Worker

## 2022-12-09 ENCOUNTER — Ambulatory Visit (HOSPITAL_COMMUNITY): Payer: No Payment, Other

## 2022-12-09 ENCOUNTER — Encounter (INDEPENDENT_AMBULATORY_CARE_PROVIDER_SITE_OTHER): Payer: BLUE CROSS/BLUE SHIELD

## 2022-12-09 ENCOUNTER — Encounter (HOSPITAL_COMMUNITY): Payer: Self-pay

## 2022-12-09 DIAGNOSIS — Z760 Encounter for issue of repeat prescription: Secondary | ICD-10-CM | POA: Diagnosis not present

## 2022-12-09 MED ORDER — ALBUTEROL SULFATE HFA 108 (90 BASE) MCG/ACT IN AERS: 2.0000 | INHALATION_SPRAY | Freq: Four times a day (QID) | RESPIRATORY_TRACT | 6 refills | Status: DC | PRN

## 2022-12-09 NOTE — Telephone Encounter (Signed)
Cln called due to pt not being connected to WebEx for [employer] CCA. Pt stated he was signed onto MyChart and had been for 10 minutes. Cln reminded pt of previous conversation in which cln discussed in detail that the assessment would be via WebEx and why it is done that way vs through MyChart. Cln also reminded pt that WebEx requires the app to be downloaded in order to connect. Pt apologized and attempted to download the app. Cln heard what sounded like a turn signal clicking and asked pt if he was driving, to which he stated he was a passenger. Pt reported it was taking a while to download the app and then realized he downloaded the wrong app. Pt attempted to download the correct app. Cln informed pt of the need to reschedule due to it appearing that pt did not have an adequate internet connection, and pt agreed. Pt confirmed availability for 4/16 at 1 pm.

## 2022-12-09 NOTE — Telephone Encounter (Signed)
Please see the MyChart message reply(ies) for my assessment and plan.    This patient gave consent for this Medical Advice Message and is aware that it may result in a bill to [employer], as well as the possibility of receiving a bill for a co-payment or deductible. They are an established patient, but are not seeking medical advice exclusively about a problem treated during an in person or video visit in the last seven days. I did not recommend an in person or video visit within seven days of my reply.    I spent a total of 6 minutes cumulative time within 7 days through [employer].  Rexene Alberts, NP

## 2022-12-13 ENCOUNTER — Telehealth (HOSPITAL_COMMUNITY): Payer: Self-pay | Admitting: Licensed Clinical Social Worker

## 2022-12-13 ENCOUNTER — Ambulatory Visit (HOSPITAL_COMMUNITY): Payer: No Payment, Other | Admitting: Licensed Clinical Social Worker

## 2022-12-13 DIAGNOSIS — F4310 Post-traumatic stress disorder, unspecified: Secondary | ICD-10-CM | POA: Insufficient documentation

## 2022-12-13 DIAGNOSIS — F1210 Cannabis abuse, uncomplicated: Secondary | ICD-10-CM
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
## 2022-12-13 NOTE — Progress Notes (Signed)
   12/13/22 1359  Patient-Centered Profile  PCP Completed On 12/13/22  Plan Meeting Date 12/13/22  Effective Date 12/13/22  What People Like and [place] About? "My resilience."  What's Important to? "My family, but being able to be free is also important."  How Best to Support? "By helping me learn different coping mechanisms to be honest because life is getting super stressful."  Add What's Working / [employer] Not Working? "Drag helps me get a chance to express my emotions. Getting away to be by myself is getting harder and harder."  PCP Action Plan  Long Range Outcome improvement in functioning and reduction in symptoms   Pt agrees to plan

## 2022-12-15 ENCOUNTER — Ambulatory Visit (INDEPENDENT_AMBULATORY_CARE_PROVIDER_SITE_OTHER): Payer: BLUE CROSS/BLUE SHIELD | Admitting: Pharmacist

## 2022-12-15 ENCOUNTER — Other Ambulatory Visit: Payer: Self-pay

## 2022-12-15 ENCOUNTER — Ambulatory Visit (INDEPENDENT_AMBULATORY_CARE_PROVIDER_SITE_OTHER): Payer: Self-pay

## 2022-12-15 DIAGNOSIS — Z23 Encounter for immunization: Secondary | ICD-10-CM

## 2022-12-15 DIAGNOSIS — B20 Human immunodeficiency virus [HIV] disease: Secondary | ICD-10-CM

## 2022-12-15 MED ORDER — CABOTEGRAVIR & RILPIVIRINE ER 600 & 900 MG/3ML IM SUER
1.0000 | Freq: Once | INTRAMUSCULAR | Status: AC
  Administered 2022-12-15: 1 via INTRAMUSCULAR
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
## 2022-12-16 DIAGNOSIS — M545 Low back pain, unspecified: Secondary | ICD-10-CM

## 2022-12-18 LAB — HIV-1 RNA QUANT-NO REFLEX-BLD
HIV 1 RNA Quant: NOT DETECTED Copies/mL
HIV-1 RNA Quant, Log: NOT DETECTED Log cps/mL

## 2022-12-19 ENCOUNTER — Telehealth (HOSPITAL_COMMUNITY): Payer: Self-pay | Admitting: Professional

## 2022-12-19 ENCOUNTER — Ambulatory Visit (HOSPITAL_COMMUNITY): Payer: No Payment, Other

## 2022-12-19 ENCOUNTER — Telehealth: Payer: Self-pay

## 2022-12-19 ENCOUNTER — Other Ambulatory Visit (HOSPITAL_COMMUNITY): Payer: Self-pay

## 2022-12-19 ENCOUNTER — Encounter (HOSPITAL_COMMUNITY): Payer: Self-pay

## 2022-12-19 MED ORDER — MELOXICAM 15 MG PO TABS: 15.0000 mg | ORAL_TABLET | Freq: Every day | ORAL | 0 refills | Status: DC

## 2022-12-19 NOTE — Telephone Encounter (Signed)
[facility] Patient Advocate Encounter  I went on BCBS portal to check to see if Cabenuva [phone]) will need a PA.   RX Case # [id number]  Pending reveiw  [name], CPhT Specialty Pharmacy Patient [facility] Phone: [phone] Fax:  [phone]

## 2022-12-20 ENCOUNTER — Telehealth (HOSPITAL_COMMUNITY): Payer: Self-pay | Admitting: Licensed Clinical Social Worker

## 2022-12-20 ENCOUNTER — Ambulatory Visit (HOSPITAL_COMMUNITY): Payer: No Payment, Other
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
## 2022-12-20 NOTE — Telephone Encounter (Signed)
Cln called pt to f/u on vm left from infectious disease inquiring about PHP being covered under BCBS. Per chart review, pt has [employer]  ([employer]) that was effective as of 09/29/2022 and the card was scanned into his chart on 4/22. Pt stated he overslept due to being stressed out after finding out he no longer has Medicaid but that he has BCBS. Cln informed pt that [place] is not in-network with [employer] and advised pt to call the number on the back of the card to find providers that are in-network. Pt asked how much the program is out-of-pocket and cln informed him that PHP is $835 per day. Cln advised pt that in the future, it is advisable for pt to call himself, as cln does not have an ROI to discuss information about pt with other healthcare providers or case managers. Cln informed pt that his outpt [facility] appointments will be cancelled as well. Pt verbalized understanding.

## 2022-12-21 ENCOUNTER — Ambulatory Visit (HOSPITAL_COMMUNITY): Payer: No Payment, Other

## 2022-12-22 ENCOUNTER — Telehealth: Payer: Self-pay

## 2022-12-22 ENCOUNTER — Ambulatory Visit (HOSPITAL_COMMUNITY): Payer: No Payment, Other

## 2022-12-22 NOTE — Telephone Encounter (Signed)
[facility] Patient Advocate Encounter   Was successful in obtaining a Viiv copay card for Cabenuva.  This copay card will make the patients copay 0.00.        Jessly Lebeck, CPhT Specialty Pharmacy Patient [facility] Phone: [phone] Fax:  [phone]  

## 2022-12-22 NOTE — Telephone Encounter (Signed)
[facility] Patient Advocate Encounter  Gerald Lee [phone]) The requested medication does not require prior authorization review.  BCBS  Notification will be in Media.  [name], CPhT Specialty Pharmacy Patient [facility] for Infect[facility] Disease Phone: [phone] Fax:  [phone]

## 2022-12-23 ENCOUNTER — Ambulatory Visit (HOSPITAL_COMMUNITY): Payer: No Payment, Other

## 2022-12-26 ENCOUNTER — Ambulatory Visit (HOSPITAL_COMMUNITY): Payer: No Payment, Other

## 2022-12-27 ENCOUNTER — Ambulatory Visit (HOSPITAL_COMMUNITY): Payer: No Payment, Other

## 2022-12-28 ENCOUNTER — Ambulatory Visit (HOSPITAL_COMMUNITY): Payer: No Payment, Other

## 2022-12-29 ENCOUNTER — Ambulatory Visit (HOSPITAL_COMMUNITY): Payer: No Payment, Other

## 2022-12-30 ENCOUNTER — Ambulatory Visit (HOSPITAL_COMMUNITY): Payer: No Payment, Other

## 2023-01-02 ENCOUNTER — Ambulatory Visit (HOSPITAL_COMMUNITY): Payer: No Payment, Other

## 2023-01-03 ENCOUNTER — Ambulatory Visit (HOSPITAL_COMMUNITY): Payer: No Payment, Other

## 2023-01-03 ENCOUNTER — Ambulatory Visit (HOSPITAL_COMMUNITY): Payer: No Payment, Other | Admitting: Licensed Clinical Social Worker

## 2023-01-04 ENCOUNTER — Ambulatory Visit (HOSPITAL_COMMUNITY): Payer: No Payment, Other

## 2023-01-05 ENCOUNTER — Ambulatory Visit (HOSPITAL_COMMUNITY): Payer: No Payment, Other

## 2023-01-06 ENCOUNTER — Ambulatory Visit (HOSPITAL_COMMUNITY): Payer: No Payment, Other

## 2023-01-09 ENCOUNTER — Ambulatory Visit (HOSPITAL_COMMUNITY): Payer: No Payment, Other

## 2023-01-10 ENCOUNTER — Ambulatory Visit (HOSPITAL_COMMUNITY): Payer: No Payment, Other

## 2023-01-11 ENCOUNTER — Ambulatory Visit (HOSPITAL_COMMUNITY): Payer: No Payment, Other

## 2023-01-12 ENCOUNTER — Ambulatory Visit (HOSPITAL_COMMUNITY): Payer: No Payment, Other

## 2023-01-13 ENCOUNTER — Ambulatory Visit (HOSPITAL_COMMUNITY): Payer: No Payment, Other

## 2023-01-13 ENCOUNTER — Encounter (HOSPITAL_COMMUNITY): Payer: Self-pay

## 2023-01-31 ENCOUNTER — Ambulatory Visit (INDEPENDENT_AMBULATORY_CARE_PROVIDER_SITE_OTHER): Payer: BLUE CROSS/BLUE SHIELD | Admitting: Adult Health

## 2023-01-31 VITALS — BP 115/73 | Ht 73.0 in | Wt 312.0 lb

## 2023-01-31 DIAGNOSIS — Z6841 Body Mass Index (BMI) 40.0 and over, adult: Secondary | ICD-10-CM

## 2023-01-31 DIAGNOSIS — Z21 Asymptomatic human immunodeficiency virus [HIV] infection status: Secondary | ICD-10-CM

## 2023-01-31 NOTE — Progress Notes (Signed)
Office: [phone]  /  Fax: [phone]   Initial Visit  Gerald Lee was seen in clinic today to evaluate for obesity. He is interested in losing weight to improve overall health and reduce the risk of weight related complications. He presents today to review program treatment options, initial physical assessment, and evaluation.     He was referred by: PCP  When asked what else they would like to accomplish? He states: Adopt healthier eating patterns and Improve quality of life  Weight history: Increased weight since Adolescence   When asked how has your weight affected you? He states: Contributed to orthopedic problems or mobility issues, Having fatigue, and Problems with eating patterns  Some associated conditions: Vitamin D Deficiency  Contributing factors: Reduced physical activity and Eating patterns  Weight promoting medications identified: None  Current nutrition plan: None  Current level of physical activity: Walking  Current or previous pharmacotherapy: None  Response to medication: Never tried medications   Past medical history includes:   Past Medical History:  Diagnosis Date   Asthma    Headache(784.0)    HIV (human immunodeficiency virus infection) (HCC)    HIV infection (HCC)    Orthostatic hypotension      Objective:   Ht 6\' 1"  (1.854 m)   Wt (!) 312 lb (141.5 kg)   BMI 41.16 kg/m  He was weighed on the bioimpedance scale: Body mass index is 41.16 kg/m.  Peak Weight:312 , Body Fat%:36.7, Visceral Fat Rating:18, Weight trend over the last 12 months: Increasing  General:  Alert, oriented and cooperative. Patient is in no acute distress.  Respiratory: Normal respiratory effort, no problems with respiration noted   Gait: able to ambulate independently  Mental Status: Normal mood and affect. Normal behavior. Normal judgment and thought content.   DIAGNOSTIC DATA REVIEWED:  BMET    Component Value Date/Time   NA 138 10/07/2021 0210   NA  138 10/30/2020 1642   K 3.7 10/07/2021 0210   CL 104 10/07/2021 0210   CO2 24 10/07/2021 0210   GLUCOSE 122 (H) 10/07/2021 0210   BUN 16 10/07/2021 0210   BUN 12 10/30/2020 1642   CREATININE 1.17 10/07/2021 0210   CREATININE 1.17 03/11/2019 1036   CALCIUM 9.2 10/07/2021 0210   GFRNONAA >60 10/07/2021 0210   GFRNONAA 88 03/11/2019 1036   GFRAA 1.07 10/30/2020 1642   GFRAA 102 03/11/2019 1036   Lab Results  Component Value Date   HGBA1C 5.4 10/30/2020   No results found for: "INSULIN" CBC    Component Value Date/Time   WBC 11.6 (H) 10/07/2021 0210   RBC 4.75 10/07/2021 0210   HGB 13.6 10/07/2021 0210   HCT 41.8 10/07/2021 0210   PLT 291 10/07/2021 0210   MCV 88.0 10/07/2021 0210   MCH 28.6 10/07/2021 0210   MCHC 32.5 10/07/2021 0210   RDW 12.6 10/07/2021 0210   Iron/TIBC/Ferritin/ %Sat No results found for: "IRON", "TIBC", "FERRITIN", "IRONPCTSAT" Lipid Panel     Component Value Date/Time   CHOL 119 12/14/2021 1030   TRIG 60 12/14/2021 1030   HDL 44 12/14/2021 1030   CHOLHDL 2.7 12/14/2021 1030   LDLCALC 61 12/14/2021 1030   Hepatic Function Panel     Component Value Date/Time   PROT 6.8 06/16/2022 1615   ALBUMIN 4.0 10/30/2020 1642   AST 14 06/16/2022 1615   ALT 15 06/16/2022 1615   ALKPHOS 123 10/30/2020 1642   BILITOT 0.9 06/16/2022 1615   BILIDIR 0.2 06/16/2022 1615  IBILI 0.7 06/16/2022 1615   No results found for: "TSH"   Assessment and Plan:   Asymptomatic HIV infection (HCC)  Morbid obesity (HCC), Starting BMI 41.2   Establish with HWW  Goal weight 250-260 lbs    Obesity Treatment / Action Plan:  Patient will work on garnering support from family and friends to begin weight loss journey. Will work on eliminating or reducing the presence of highly palatable, calorie dense foods in the home. Will complete provided nutritional and psychosocial assessment questionnaire before the next appointment. Will be scheduled for indirect calorimetry  to determine resting energy expenditure in a fasting state.  This will allow [place] to create a reduced calorie, high-protein meal plan to promote loss of fat mass while preserving muscle mass. Counseled on the health benefits of losing 5%-15% of total body weight. Was counseled on nutritional approaches to weight loss and benefits of reducing processed foods and consuming plant-based foods and high quality protein as part of nutritional weight management. Was counseled on pharmacotherapy and role as an adjunct in weight management.   Obesity Education Performed Today:  He was weighed on the bioimpedance scale and results were discussed and documented in the synopsis.  We discussed obesity as a disease and the importance of a more detailed evaluation of all the factors contributing to the disease.  We discussed the importance of long term lifestyle changes which include nutrition, exercise and behavioral modifications as well as the importance of customizing this to his specific health and social needs.  We discussed the benefits of reaching a healthier weight to alleviate the symptoms of existing conditions and reduce the risks of the biomechanical, metabolic and psychological effects of obesity.  Gerald Lee appears to be in the action stage of change and states they are ready to start intensive lifestyle modifications and behavioral modifications.  30 minutes was spent today on this visit including the above counseling, pre-visit chart review, and post-visit documentation.  Reviewed by clinician on day of visit: allergies, medications, problem list, medical history, surgical history, family history, social history, and previous encounter notes pertinent to obesity diagnosis.   Dustan Hyams d. Conard Alvira, NP-C

## 2023-02-16 ENCOUNTER — Other Ambulatory Visit (HOSPITAL_COMMUNITY): Payer: Self-pay

## 2023-02-20 ENCOUNTER — Ambulatory Visit: Payer: BLUE CROSS/BLUE SHIELD | Admitting: Infectious Diseases

## 2023-02-22 ENCOUNTER — Telehealth: Payer: Self-pay

## 2023-02-22 NOTE — Telephone Encounter (Signed)
Called patient to assist with rescheduling missed appointment left message asking patient to contact [facility] office for rescheduling.  Valarie Cones, LPN

## 2023-02-23 NOTE — Telephone Encounter (Signed)
Called [name] to reschedule his missed appointment, no answer. Left HIPAA compliant voicemail requesting callback.   Sandie Ano, RN

## 2023-02-24 MED ORDER — BICTEGRAVIR-EMTRICITAB-TENOFOV 50-200-25 MG PO TABS: 1.0000 | ORAL_TABLET | Freq: Every day | ORAL | 0 refills | Status: DC

## 2023-02-24 NOTE — Telephone Encounter (Addendum)
Third attempt to reach [name], no answer. Left HIPAA compliant voicemail requesting callback.   He has not read the MyChart message from yesterday.   Sandie Ano, RN

## 2023-02-24 NOTE — Addendum Note (Signed)
Addended by: Blanchard Kelch on: 02/24/2023 09:11 AM   Modules accepted: Orders

## 2023-02-27 DIAGNOSIS — Z419 Encounter for procedure for purposes other than remedying health state, unspecified: Secondary | ICD-10-CM | POA: Diagnosis not present

## 2023-02-28 ENCOUNTER — Encounter: Payer: Self-pay | Admitting: Infectious Diseases

## 2023-02-28 ENCOUNTER — Other Ambulatory Visit: Payer: Self-pay

## 2023-02-28 ENCOUNTER — Ambulatory Visit (INDEPENDENT_AMBULATORY_CARE_PROVIDER_SITE_OTHER): Payer: BLUE CROSS/BLUE SHIELD | Admitting: Infectious Diseases

## 2023-02-28 VITALS — BP 130/71 | HR 75 | Temp 98.0°F | Wt 314.0 lb

## 2023-02-28 DIAGNOSIS — B20 Human immunodeficiency virus [HIV] disease: Secondary | ICD-10-CM

## 2023-02-28 DIAGNOSIS — E6609 Other obesity due to excess calories: Secondary | ICD-10-CM | POA: Diagnosis not present

## 2023-02-28 DIAGNOSIS — Z6835 Body mass index (BMI) 35.0-35.9, adult: Secondary | ICD-10-CM

## 2023-02-28 DIAGNOSIS — Z21 Asymptomatic human immunodeficiency virus [HIV] infection status: Secondary | ICD-10-CM

## 2023-02-28 MED ORDER — CABOTEGRAVIR & RILPIVIRINE ER 600 & 900 MG/3ML IM SUER
1.0000 | Freq: Once | INTRAMUSCULAR | Status: AC
Start: 2023-02-28 — End: 2023-02-28
  Administered 2023-02-28: 1 via INTRAMUSCULAR

## 2023-02-28 NOTE — Progress Notes (Signed)
Name: Gerald Lee  DOB: July 04, 1996  MRN: [id number] PCP: [name], MD    Brief Narrative:  Gerald Lee is a 26 y.o. AA male with well controlled HIV, Dx 04-2017 with CD4 nadir 780.  Hep B sAg (-) High Risk: MSM.  Previous OIs: none  Previous Regimens:  Biktarvy 2018 --> suppressed  Cabenuva injections   Genotype:  02/2017 - no mutations   Subjective:   CC: Cabenuva injection and routine HIV follow-up   HPI:  Delayed out of window for cabenuva injection by 4 days with target date 22nd. Has had a lot going on at home with increased stress losing job amongst some family health problems. Would like to continue for cabeuva treatments if this is still safe to do.   No sexual health concerns today.    Review of Systems  Constitutional:  Negative for chills and fever.  HENT:  Negative for tinnitus.   Eyes:  Negative for blurred vision and photophobia.  Respiratory:  Negative for cough and sputum production.   Cardiovascular:  Negative for chest pain.  Gastrointestinal:  Negative for diarrhea, nausea and vomiting.  Genitourinary:  Negative for dysuria.  Skin:  Negative for rash.  Neurological:  Negative for headaches.     Past Medical History:  Diagnosis Date   Asthma    Headache(784.0)    HIV (human immunodeficiency virus infection) (HCC)    HIV infection (HCC)    Orthostatic hypotension     Social History   Tobacco Use   Smoking status: Former    Packs/day: .1    Types: Cigarettes, Cigars    Quit date: 11/29/2019    Years since quitting: 3.2   Smokeless tobacco: Never   Tobacco comments:    "only when super stressed"  Vaping Use   Vaping Use: Some days  Substance Use Topics   Alcohol use: Yes    Alcohol/week: 3.0 standard drinks of alcohol    Types: 3 Standard drinks or equivalent per week   Drug use: Yes    Frequency: 7.0 times per week    Types: Marijuana    Comment: daily: weekly patient goes through 7 grams    Allergies  Allergen  Reactions   Ibuprofen Other (See Comments)    Per patient "can not take because it will shut down my kidneys"    Objective:  Vitals:   02/28/23 0946  BP: 130/71  Pulse: 75  Temp: 98 F (36.7 C)  TempSrc: Oral  SpO2: 97%  Weight: (!) 314 lb (142.4 kg)    Body mass index is 41.43 kg/m.  Physical Exam Constitutional:      Appearance: He is well-developed.     Comments: Seated comfortably in chair during visit.   HENT:     Mouth/Throat:     Dentition: Normal dentition. No dental abscesses.  Cardiovascular:     Rate and Rhythm: Normal rate and regular rhythm.     Heart sounds: Normal heart sounds.  Pulmonary:     Effort: Pulmonary effort is normal.     Breath sounds: Normal breath sounds.  Abdominal:     General: There is no distension.     Palpations: Abdomen is soft.     Tenderness: There is no abdominal tenderness.  Lymphadenopathy:     Cervical: No cervical adenopathy.  Skin:    General: Skin is warm and dry.     Findings: No rash.  Neurological:     Mental Status: He is alert and oriented  to person, place, and time.  Psychiatric:        Judgment: Judgment normal.     Comments: In good spirits today and engaged in care discussion.       Lab Results Lab Results  Component Value Date   WBC 11.6 (H) 10/07/2021   HGB 13.6 10/07/2021   HCT 41.8 10/07/2021   MCV 88.0 10/07/2021   PLT 291 10/07/2021    Lab Results  Component Value Date   CREATININE 1.17 10/07/2021   BUN 16 10/07/2021   NA 138 10/07/2021   K 3.7 10/07/2021   CL 104 10/07/2021   CO2 24 10/07/2021    Lab Results  Component Value Date   ALT 15 06/16/2022   AST 14 06/16/2022   ALKPHOS 123 10/30/2020   BILITOT 0.9 06/16/2022    Lab Results  Component Value Date   CHOL 119 12/14/2021   HDL 44 12/14/2021   LDLCALC 61 12/14/2021   TRIG 60 12/14/2021   CHOLHDL 2.7 12/14/2021   HIV 1 RNA Quant  Date Value  12/15/2022 Not Detected Copies/mL  06/16/2022 Not Detected Copies/mL   12/14/2021 NOT DETECTED copies/mL   CD4 T Cell Abs (/uL)  Date Value  06/16/2022 959  08/19/2021 916  09/10/2019 1,014   Lab Results  Component Value Date   HAV REACTIVE (A) 05/22/2017   Lab Results  Component Value Date   HEPBSAG NON-REACTIVE 05/22/2017   HEPBSAB REACTIVE (A) 03/11/2019    Problem List Items Addressed This Visit       Unprioritized   Asymptomatic HIV infection (HCC)    We talked about risks associated with treatment delays for Cabenuva injections and risk for resistance mutations making it more difficult to treat the HIV infection for him. He had a good understanding as to the worry about delays in appointments and having to reschedule. We completed patient contract today. If it is in their best interest to switch to oral treatment again we can discuss this. But for now we can continue cabenuva.   Injections provided today. Will repeat VL at next OV for monitoring. 04/20/2023 for return visit with our pharmacy team - TD 22nd.       Obesity, unspecified (Chronic)    Had initial interest appt at the [facility] and working on funds to start the program. Hopeful that will help them reach goal weight of about 250 lb.       Other Visit Diagnoses     HIV disease (HCC)    -  Primary   Relevant Medications   cabotegravir & rilpivirine ER (CABENUVA) 600 & 900 MG/3ML injection 1 kit (Completed)      Rexene Alberts, MSN, NP-C [facility]  [email or web address] Pager: [phone] Office: [phone] [facility] Main Line: [phone]   02/28/23

## 2023-02-28 NOTE — Assessment & Plan Note (Signed)
We talked about risks associated with treatment delays for Cabenuva injections and risk for resistance mutations making it more difficult to treat the HIV infection for him. He had a good understanding as to the worry about delays in appointments and having to reschedule. We completed patient contract today. If it is in their best interest to switch to oral treatment again we can discuss this. But for now we can continue cabenuva.   Injections provided today. Will repeat VL at next OV for monitoring. 04/20/2023 for return visit with our pharmacy team - TD 22nd.

## 2023-02-28 NOTE — Assessment & Plan Note (Signed)
Had initial interest appt at the [facility] and working on funds to start the program. Hopeful that will help them reach goal weight of about 250 lb.

## 2023-02-28 NOTE — Patient Instructions (Addendum)
Your next appointment is 04/20/2023 with Cassie. Our pharmacy team will take care of your next few injections August, October and December.   I will plan to see you back in February (or sooner if you need to put an earlier appt with me)

## 2023-03-30 DIAGNOSIS — Z419 Encounter for procedure for purposes other than remedying health state, unspecified: Secondary | ICD-10-CM | POA: Diagnosis not present

## 2023-04-20 ENCOUNTER — Encounter: Payer: BLUE CROSS/BLUE SHIELD | Admitting: Pharmacist

## 2023-04-21 ENCOUNTER — Ambulatory Visit
Admission: RE | Admit: 2023-04-21 | Discharge: 2023-04-21 | Disposition: A | Discharge status: Home / Self Care | Payer: BLUE CROSS/BLUE SHIELD | Source: Ambulatory Visit | Attending: Internal Medicine | Admitting: Internal Medicine

## 2023-04-21 ENCOUNTER — Other Ambulatory Visit: Payer: Self-pay

## 2023-04-21 VITALS — BP 113/73 | HR 78 | Temp 98.1°F | Resp 16

## 2023-04-21 DIAGNOSIS — L739 Follicular disorder, unspecified: Secondary | ICD-10-CM | POA: Diagnosis not present

## 2023-04-21 NOTE — ED Triage Notes (Signed)
Pt presents to UC w/ c/o bumps on "shaft of penis" since yesterday. Pt reports recent unprotected intercourse.

## 2023-04-21 NOTE — Discharge Instructions (Addendum)
Patient declined AVS 

## 2023-04-21 NOTE — ED Provider Notes (Addendum)
[facility]    CSN: [id number] Arrival date & time: 04/21/23  1732      History   Chief Complaint Chief Complaint  Patient presents with   SEXUALLY TRANSMITTED DISEASE    I belive I may have been exposed to an std just wanna get checked out - Entered by patient    HPI Gerald Lee is a 27 y.o. male presents for evaluation of bumps on his penis.  He noticed these yesterday the base of the shaft.  Denies any pain or itching.  No swelling, drainage, fevers, chills.  He did start using a new soap.  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  He states he has had something similar in the past that resolved on its own.  He is HIV positive and does have a history of syphilis but states this does not feel similar.  He did have unprotected intercourse recently.  No known STD exposure.  No testicular pain or swelling or penile discharge.  He has an appointment to have blood work drawn next week including STD testing.  No other concerns at this time.  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 Authorizing Provider  albuterol (VENTOLIN HFA) 108 (90 Base) MCG/ACT inhaler Inhale 2 puffs into the lungs every 6 (six) hours as needed for wheezing or shortness of breath. 12/09/22   Blanchard Kelch, NP  cabotegravir & rilpivirine ER (CABENUVA) 600 & 900 MG/3ML injection Inject 1 kit into the muscle every 2 (two) months. 08/02/22   Kuppelweiser, Cassie L, RPH-CPP  doxycycline (VIBRA-TABS) 100 MG tablet Take 2 tablets (200 mg total) by mouth once as needed for up to 1 dose. Within 24 hours of at risk activity Patient not taking: Reported on 02/28/2023 03/30/22   Blanchard Kelch, NP  loratadine (CLARITIN) 10 MG tablet Take 10 mg by mouth daily. 02/03/20   [provider]  meloxicam (MOBIC) 15 MG tablet Take 1 tablet (15 mg total) by mouth daily. 12/19/22   Blanchard Kelch, NP    Family History Family History  Problem Relation Age of Onset   Stroke Mother    Hypertension Mother    Diabetes Father     Social History Social History   Tobacco Use   Smoking status: Former    Current packs/day: 0.00    Types: Cigarettes, Cigars    Quit date:  11/29/2019    Years since quitting: 3.3   Smokeless tobacco: Never   Tobacco comments:    "only when super stressed"  Vaping Use   Vaping status: Some Days  Substance Use Topics   Alcohol use: Yes    Alcohol/week: 3.0 standard drinks of alcohol    Types: 3 Standard drinks or equivalent per week   Drug use: Yes    Frequency: 7.0 times per week    Types: Marijuana    Comment: daily: weekly patient goes through 7 grams     Allergies   Ibuprofen   Review of Systems Review of Systems  Genitourinary:        Bumps on penis      Physical Exam Triage Vital Signs ED Triage Vitals  Encounter Vitals Group     BP 04/21/23 1740 113/73     Systolic BP Percentile --      Diastolic BP Percentile --      Pulse Rate 04/21/23  1740 78     Resp 04/21/23 1740 16     Temp 04/21/23 1740 98.1 F (36.7 C)     Temp Source 04/21/23 1740 Oral     SpO2 04/21/23 1740 96 %     Weight --      Height --      Head Circumference --      Peak Flow --      Pain Score 04/21/23 1739 1     Pain Loc --      Pain Education --      Exclude from Growth Chart --    No data found.  Updated Vital Signs BP 113/73 (BP Location: Left Arm)   Pulse 78   Temp 98.1 F (36.7 C) (Oral)   Resp 16   SpO2 96%   Visual Acuity Right Eye Distance:   Left Eye Distance:   Bilateral Distance:    Right Eye Near:   Left Eye Near:    Bilateral Near:     Physical Exam Vitals and nursing note reviewed. Exam conducted with a chaperone present Dewain Penning RN).  Constitutional:      General: He is not in acute distress.    Appearance: Normal appearance. He is not ill-appearing.  HENT:     Head: Normocephalic and atraumatic.  Eyes:     Pupils: Pupils are equal, round, and reactive to light.  Cardiovascular:     Rate and Rhythm: Normal rate.  Pulmonary:     Effort: Pulmonary effort is normal.  Genitourinary:    Penis: Circumcised.        Comments: Small papules to the left aspect of the base of the penis.  No vesicles, ulcerations, lesions, swelling, drainage. Skin:    General: Skin is warm and dry.  Neurological:     General: No focal deficit present.     Mental Status: He is alert and oriented to person, place, and time.  Psychiatric:        Mood and Affect: Mood normal.        Behavior: Behavior normal.      UC Treatments / Results  Labs (all labs ordered are listed, but only abnormal results are displayed) Labs Reviewed - No data to display  EKG   Radiology No results found.  Procedures Procedures (including critical care time)  Medications Ordered in UC Medications - No data to display  Initial Impression / Assessment and Plan / UC Course  I have reviewed the triage vital signs and  the nursing  notes.  Pertinent labs & imaging results that were available during my care of the patient were reviewed by me and considered in my medical decision making (see chart for details).     Reviewed exam and symptoms with patient.  He declines STD testing at this time as he has an appointment next week for this.  Exam more consistent with a folliculitis.  Advised patient to switch to antibacterial soap and avoid manipulation of the area until symptoms resolve.  Warm compresses as needed.  Signs and symptoms of infection and return precautions reviewed.  PCP follow-up if symptoms not reviewed.  ER precautions reviewed. Final Clinical Impressions(s) / UC Diagnoses   Final diagnoses:  Folliculitis     Discharge Instructions      Patient declined AVS    ED Prescriptions   None    PDMP not reviewed this encounter.   Radford Pax, NP 04/21/23 1800    Radford Pax, NP 04/21/23 [phone]

## 2023-04-25 ENCOUNTER — Ambulatory Visit (INDEPENDENT_AMBULATORY_CARE_PROVIDER_SITE_OTHER): Payer: BLUE CROSS/BLUE SHIELD | Admitting: Infectious Diseases

## 2023-04-25 ENCOUNTER — Encounter: Payer: Self-pay | Admitting: Infectious Diseases

## 2023-04-25 ENCOUNTER — Other Ambulatory Visit: Payer: Self-pay

## 2023-04-25 VITALS — BP 119/79 | HR 68 | Temp 98.4°F | Wt 308.0 lb

## 2023-04-25 DIAGNOSIS — Z21 Asymptomatic human immunodeficiency virus [HIV] infection status: Secondary | ICD-10-CM

## 2023-04-25 DIAGNOSIS — L739 Follicular disorder, unspecified: Secondary | ICD-10-CM | POA: Diagnosis not present

## 2023-04-25 MED ORDER — LORATADINE 10 MG PO TABS: 10.0000 mg | ORAL_TABLET | Freq: Every day | ORAL | 11 refills | Status: DC

## 2023-04-25 MED ORDER — CABOTEGRAVIR & RILPIVIRINE ER 600 & 900 MG/3ML IM SUER
1.0000 | Freq: Once | INTRAMUSCULAR | Status: AC
Start: 2023-04-25 — End: 2023-04-25
  Administered 2023-04-25: 1 via INTRAMUSCULAR

## 2023-04-25 NOTE — Progress Notes (Signed)
Name: Gerald Lee  DOB: 1996-07-23  MRN: [id number] PCP: [name], MD    Brief Narrative:  Gerald Lee is a 27 y.o. AA male with well controlled HIV, Dx 04-2017 with CD4 nadir 780.  Hep B sAg (-) High Risk: MSM.  Previous OIs: none  Previous Regimens:  Biktarvy 2018 --> suppressed  Cabenuva injections 2022  Genotype:  02/2017 - no mutations   Subjective:   CC: Cabenuva injection and routine HIV follow-up Bumps on mons but they are better now.    HPI:  No problems today - having a lot of stress with car and new job. Working outside more and having allergy symptoms - refill of claritin requested.   No sexual health concerns today. Had a rash on the mons pubis that seems to be getting better. No other symptoms to describe. Likely related to friction / folliculitis of hair distribution. Nonpainful.    Review of Systems  Constitutional:  Negative for chills and fever.  HENT:  Negative for tinnitus.   Eyes:  Negative for blurred vision and photophobia.  Respiratory:  Negative for cough and sputum production.   Cardiovascular:  Negative for chest pain.  Gastrointestinal:  Negative for diarrhea, nausea and vomiting.  Genitourinary:  Negative for dysuria.  Skin:  Positive for rash.  Neurological:  Negative for headaches.     Past Medical History:  Diagnosis Date   Asthma    Headache(784.0)    HIV (human immunodeficiency virus infection) (HCC)    HIV infection (HCC)    Orthostatic hypotension     Social History   Tobacco Use   Smoking status: Former    Current packs/day: 0.00    Types: Cigarettes, Cigars    Quit date: 11/29/2019    Years since quitting: 3.4   Smokeless tobacco: Never   Tobacco comments:    "only when super stressed"  Vaping Use   Vaping status: Some Days  Substance Use Topics   Alcohol use: Yes    Alcohol/week: 3.0 standard drinks of alcohol    Types: 3 Standard drinks or equivalent per week   Drug use: Yes    Frequency: 7.0 times  per week    Types: Marijuana    Comment: daily: weekly patient goes through 7 grams    Allergies  Allergen Reactions   Ibuprofen Other (See Comments)    Per patient "can not take because it will shut down my kidneys"    Objective:  Vitals:   04/25/23 1347  BP: 119/79  Pulse: 68  Temp: 98.4 F (36.9 C)  TempSrc: Oral  SpO2: 97%  Weight: (!) 308 lb (139.7 kg)    Body mass index is 40.64 kg/m.  Physical Exam Vitals reviewed.      Lab Results Lab Results  Component Value Date   WBC 11.6 (H) 10/07/2021   HGB 13.6 10/07/2021   HCT 41.8 10/07/2021   MCV 88.0 10/07/2021   PLT 291 10/07/2021    Lab Results  Component Value Date   CREATININE 1.17 10/07/2021   BUN 16 10/07/2021   NA 138 10/07/2021   K 3.7 10/07/2021   CL 104 10/07/2021   CO2 24 10/07/2021    Lab Results  Component Value Date   ALT 15 06/16/2022   AST 14 06/16/2022   ALKPHOS 123 10/30/2020   BILITOT 0.9 06/16/2022    Lab Results  Component Value Date   CHOL 119 12/14/2021   HDL 44 12/14/2021   LDLCALC 61 12/14/2021  TRIG 60 12/14/2021   CHOLHDL 2.7 12/14/2021   HIV 1 RNA Quant  Date Value  12/15/2022 Not Detected Copies/mL  06/16/2022 Not Detected Copies/mL  12/14/2021 NOT DETECTED copies/mL   CD4 T Cell Abs (/uL)  Date Value  06/16/2022 959  08/19/2021 916  09/10/2019 1,014   Lab Results  Component Value Date   HAV REACTIVE (A) 05/22/2017   Lab Results  Component Value Date   HEPBSAG NON-REACTIVE 05/22/2017   HEPBSAB REACTIVE (A) 03/11/2019    Problem List Items Addressed This Visit       Unprioritized   Asymptomatic HIV infection (HCC)    Doing well on Cabenuva injections. Tolerating well with only minor injection site reactions. Continue Q81m lab checks.  Due next in October.   Last VL result:  HIV 1 RNA Quant (Copies/mL)  Date Value  12/15/2022 Not Detected    Dose Interval: Q2M TD 22nd  Next Appointment: 06/19/2023       Folliculitis - Primary     Papular rash described over hair distribution of mons above shaft of penis. These are barely visible now. Non-painful. No lymphadenopathy.  Suspect friction from tight clothing vs folliculitis.        Gerald Alberts, MSN, NP-C [facility]  [email or web address] Pager: [phone] Office: [phone] [facility] Main Line: [phone]   04/25/23

## 2023-04-25 NOTE — Assessment & Plan Note (Signed)
Papular rash described over hair distribution of mons above shaft of penis. These are barely visible now. Non-painful. No lymphadenopathy.  Suspect friction from tight clothing vs folliculitis.

## 2023-04-25 NOTE — Addendum Note (Signed)
Addended by: Juanita Laster on: 04/25/2023 02:48 PM   Modules accepted: Orders

## 2023-04-25 NOTE — Assessment & Plan Note (Signed)
Doing well on Cabenuva injections. Tolerating well with only minor injection site reactions. Continue Q6m lab checks.  Due next in October.   Last VL result:  HIV 1 RNA Quant (Copies/mL)  Date Value  12/15/2022 Not Detected    Dose Interval: Q2M TD 22nd  Next Appointment: 06/19/2023

## 2023-04-26 ENCOUNTER — Encounter: Payer: BLUE CROSS/BLUE SHIELD | Admitting: Pharmacist

## 2023-04-30 DIAGNOSIS — Z419 Encounter for procedure for purposes other than remedying health state, unspecified: Secondary | ICD-10-CM | POA: Diagnosis not present

## 2023-05-24 ENCOUNTER — Encounter: Payer: Self-pay | Admitting: Infectious Diseases

## 2023-05-24 ENCOUNTER — Other Ambulatory Visit: Payer: Self-pay

## 2023-05-24 ENCOUNTER — Ambulatory Visit (INDEPENDENT_AMBULATORY_CARE_PROVIDER_SITE_OTHER): Payer: BLUE CROSS/BLUE SHIELD | Admitting: Infectious Diseases

## 2023-05-24 VITALS — BP 117/73 | HR 82 | Temp 97.4°F | Ht 73.0 in | Wt 312.0 lb

## 2023-05-24 DIAGNOSIS — A510 Primary genital syphilis: Secondary | ICD-10-CM | POA: Diagnosis not present

## 2023-05-24 MED ORDER — PENICILLIN G BENZATHINE 1200000 UNIT/2ML IM SUSY
1.2000 10*6.[IU] | PREFILLED_SYRINGE | Freq: Once | INTRAMUSCULAR | Status: AC
Start: 2023-05-24 — End: 2023-05-24
  Administered 2023-05-24: 1.2 10*6.[IU] via INTRAMUSCULAR

## 2023-05-24 NOTE — Patient Instructions (Signed)
Start prilosec over the counter for reflux symptoms - can do as needed for now

## 2023-05-25 ENCOUNTER — Other Ambulatory Visit (HOSPITAL_COMMUNITY): Payer: Self-pay

## 2023-05-25 LAB — RPR: RPR Ser Ql: REACTIVE — AB

## 2023-05-25 LAB — RPR TITER: RPR Titer: 1:32 {titer} — ABNORMAL HIGH

## 2023-05-26 LAB — T PALLIDUM AB: T Pallidum Abs: POSITIVE — AB

## 2023-05-26 NOTE — Assessment & Plan Note (Signed)
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 He has had HMPX in the past - I have not seen any relapsed cases of this after primary infection and we are not really seeing this in the community. Will collect RPR and treat with 2.4 million units bicillin today.  Offered testing/treatment to his partner who joined him today - he will come back pending today's lab results.

## 2023-05-30 DIAGNOSIS — Z419 Encounter for procedure for purposes other than remedying health state, unspecified: Secondary | ICD-10-CM | POA: Diagnosis not present

## 2023-06-14 ENCOUNTER — Other Ambulatory Visit (HOSPITAL_COMMUNITY): Payer: Self-pay

## 2023-06-19 ENCOUNTER — Ambulatory Visit: Payer: BLUE CROSS/BLUE SHIELD | Admitting: Infectious Diseases

## 2023-06-20 ENCOUNTER — Ambulatory Visit (INDEPENDENT_AMBULATORY_CARE_PROVIDER_SITE_OTHER): Payer: BLUE CROSS/BLUE SHIELD | Admitting: Pharmacist

## 2023-06-20 ENCOUNTER — Other Ambulatory Visit: Payer: Self-pay

## 2023-06-20 DIAGNOSIS — Z23 Encounter for immunization: Secondary | ICD-10-CM

## 2023-06-20 DIAGNOSIS — Z21 Asymptomatic human immunodeficiency virus [HIV] infection status: Secondary | ICD-10-CM

## 2023-06-20 DIAGNOSIS — B20 Human immunodeficiency virus [HIV] disease: Secondary | ICD-10-CM

## 2023-06-20 DIAGNOSIS — Z79899 Other long term (current) drug therapy: Secondary | ICD-10-CM

## 2023-06-20 DIAGNOSIS — Z113 Encounter for screening for infections with a predominantly sexual mode of transmission: Secondary | ICD-10-CM

## 2023-06-20 DIAGNOSIS — Z0000 Encounter for general adult medical examination without abnormal findings: Secondary | ICD-10-CM

## 2023-06-20 MED ORDER — CABOTEGRAVIR & RILPIVIRINE ER 600 & 900 MG/3ML IM SUER
1.0000 | Freq: Once | INTRAMUSCULAR | Status: AC
  Administered 2023-06-20: 1 via INTRAMUSCULAR

## 2023-06-20 NOTE — Progress Notes (Signed)
HPI: Gerald Lee is a 27 y.o. male who presents to the [facility] pharmacy clinic for [place] administration.  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   CABOTEGRAVIR & RILPIVIRINE ER (CABENUVA) 600 & 900 MG/3ML INJECTION    Inject 1 kit into the muscle every 2 (two) months.   DOXYCYCLINE (VIBRA-TABS) 100 MG TABLET    Take 2 tablets (200 mg total) by mouth once as needed for up to 1 dose. Within 24 hours of at risk activity   LORATADINE (CLARITIN) 10 MG TABLET    Take 1 tablet (10 mg total) by mouth daily.   MELOXICAM (MOBIC) 15 MG TABLET    Take 1 tablet (15 mg total) by mouth daily.  Modified Medications   No medications on file  Discontinued Medications   No medications on file    Allergies: Allergies  Allergen Reactions   Ibuprofen Other (See Comments)    Per patient "can not take because it will shut down my kidneys"    Labs: Lab Results  Component Value Date   HIV1RNAQUANT Not Detected 12/15/2022   HIV1RNAQUANT Not Detected 06/16/2022   HIV1RNAQUANT NOT DETECTED 12/14/2021   CD4TABS 959 06/16/2022   CD4TABS 916 08/19/2021   CD4TABS 1,014 09/10/2019    RPR and STI Lab Results  Component Value Date    LABRPR REACTIVE (A) 05/24/2023   LABRPR REACTIVE (A) 06/16/2022   LABRPR REACTIVE (A) 03/30/2022   LABRPR NON-REACTIVE 12/14/2021   LABRPR REACTIVE (A) 04/27/2021   RPRTITER 1:32 (H) 05/24/2023   RPRTITER 1:16 (H) 06/16/2022   RPRTITER 1:32 (H) 03/30/2022   RPRTITER 1:2 (H) 04/27/2021   RPRTITER 1:16 (H) 02/17/2021    STI Results GC CT  03/30/2022  1:57 PM Negative    Negative    Negative  Negative    Negative    Negative   04/27/2021  3:11 PM Positive    Positive    Negative  Negative    Negative    Negative   12/10/2020  3:36 PM Negative    Negative    Negative  Negative    Positive    Positive   10/16/2019 11:05 AM Negative  Negative   05/11/2019 12:00 AM **POSITIVE**  C **POSITIVE**  C  03/11/2019 12:00 AM Negative  Negative   01/06/2019 12:00 AM Negative  Negative   10/01/2018 12:00 AM **POSITIVE**  Negative   06/11/2018 12:00 AM Negative    Negative    Negative  Negative    Negative    Negative   02/16/2018 12:00 AM Negative  Negative   10/13/2017 12:00 AM Negative  Negative   09/11/2017 12:00 AM Negative  Negative   05/22/2017 12:00 AM Negative    Negative  Negative    **POSITIVE**  05/17/2017 12:00 AM Negative  Negative     C Corrected result   Multiple values from one day are sorted in reverse-chronological order    Hepatitis B Lab Results  Component Value Date   HEPBSAB REACTIVE (A) 03/11/2019   HEPBSAG NON-REACTIVE 05/22/2017   Hepatitis C Lab Results  Component Value Date   HEPCAB NON-REACTIVE 05/22/2017   Hepatitis A Lab Results  Component Value Date   HAV REACTIVE (A) 05/22/2017   Lipids: Lab Results  Component Value Date   CHOL 119 12/14/2021   TRIG 60 12/14/2021   HDL 44 12/14/2021   CHOLHDL 2.7 12/14/2021   LDLCALC 61 12/14/2021    TARGET DATE: The 22nd  Assessment: [name] presents today for their maintenance Cabenuva injections. Past injections were tolerated well without issues. Last HIV RNA was undetectable in April.  Will recheck today. Will also do yearly labs as the last time they had these drawn was October 2023. Rash and symptoms resolved with Bicillin treatment back in September. Declines STI testing today. Agrees to receive the annual flu and 2024-2025 COVID vaccines today.  Administered cabotegravir 600mg /30mL in left upper outer quadrant of the gluteal muscle. Administered rilpivirine 900 mg/33mL in the right upper outer quadrant of the gluteal muscle. No issues with injections. They will follow up in 2 months for next set of injections.  Judeth Cornfield is unavailable for February injection window. Will schedule with her in April 2025.   Plan: - Cabenuva injections administered - HIV RNA, CD4, RPR, CMP, CBC w diff, and lipid panel today - Administered the annual flu vaccine today - Administered the 2024-2025 COVID vaccine today - Next injections scheduled for 08/14/23 and 10/16/23 with me - Call with any issues or questions  Norinne Jeane L. Maythe Deramo, PharmD, BCIDP, AAHIVP, CPP Clinical Pharmacist Practitioner Infectious Diseases Clinical Pharmacist [facility]

## 2023-06-21 LAB — T-HELPER CELLS (CD4) COUNT (NOT AT ARMC)
CD4 % Helper T Cell: 37 % (ref 33–65)
CD4 T Cell Abs: 1042 /uL (ref 400–1790)

## 2023-06-23 LAB — CBC WITH DIFFERENTIAL/PLATELET
Absolute Lymphocytes: 3227 {cells}/uL (ref 850–3900)
Absolute Monocytes: 431 {cells}/uL (ref 200–950)
Basophils Absolute: 29 {cells}/uL (ref 0–200)
Basophils Relative: 0.4 %
Eosinophils Absolute: 51 {cells}/uL (ref 15–500)
Eosinophils Relative: 0.7 %
HCT: 46.9 % (ref 38.5–50.0)
Hemoglobin: 15 g/dL (ref 13.2–17.1)
MCH: 28 pg (ref 27.0–33.0)
MCHC: 32 g/dL (ref 32.0–36.0)
MCV: 87.5 fL (ref 80.0–100.0)
MPV: 11.1 fL (ref 7.5–12.5)
Monocytes Relative: 5.9 %
Neutro Abs: 3562 {cells}/uL (ref 1500–7800)
Neutrophils Relative %: 48.8 %
Platelets: 240 10*3/uL (ref 140–400)
RBC: 5.36 10*6/uL (ref 4.20–5.80)
RDW: 12.9 % (ref 11.0–15.0)
Total Lymphocyte: 44.2 %
WBC: 7.3 10*3/uL (ref 3.8–10.8)

## 2023-06-23 LAB — COMPREHENSIVE METABOLIC PANEL
AG Ratio: 1.8 (calc) (ref 1.0–2.5)
ALT: 13 U/L (ref 9–46)
AST: 15 U/L (ref 10–40)
Albumin: 4.6 g/dL (ref 3.6–5.1)
Alkaline phosphatase (APISO): 111 U/L (ref 36–130)
BUN: 13 mg/dL (ref 7–25)
CO2: 26 mmol/L (ref 20–32)
Calcium: 9.8 mg/dL (ref 8.6–10.3)
Chloride: 104 mmol/L (ref 98–110)
Creat: 1.06 mg/dL (ref 0.60–1.24)
Globulin: 2.6 g/dL (ref 1.9–3.7)
Glucose, Bld: 92 mg/dL (ref 65–99)
Potassium: 3.8 mmol/L (ref 3.5–5.3)
Sodium: 138 mmol/L (ref 135–146)
Total Bilirubin: 0.4 mg/dL (ref 0.2–1.2)
Total Protein: 7.2 g/dL (ref 6.1–8.1)

## 2023-06-23 LAB — T PALLIDUM AB: T Pallidum Abs: POSITIVE — AB

## 2023-06-23 LAB — LIPID PANEL
Cholesterol: 131 mg/dL (ref <200)
HDL: 48 mg/dL (ref >=40)
LDL Cholesterol (Calc): 69 mg/dL
Non-HDL Cholesterol (Calc): 83 mg/dL (ref <130)
Total CHOL/HDL Ratio: 2.7 (calc) (ref <5.0)
Triglycerides: 65 mg/dL (ref <150)

## 2023-06-23 LAB — RPR TITER: RPR Titer: 1:16 {titer} — ABNORMAL HIGH

## 2023-06-23 LAB — HIV-1 RNA QUANT-NO REFLEX-BLD
HIV 1 RNA Quant: NOT DETECTED {copies}/mL
HIV-1 RNA Quant, Log: NOT DETECTED {Log_copies}/mL

## 2023-06-23 LAB — RPR: RPR Ser Ql: REACTIVE — AB

## 2023-06-30 DIAGNOSIS — Z419 Encounter for procedure for purposes other than remedying health state, unspecified: Secondary | ICD-10-CM | POA: Diagnosis not present

## 2023-07-24 MED ORDER — DICYCLOMINE HCL 10 MG PO CAPS: 10.0000 mg | ORAL_CAPSULE | Freq: Three times a day (TID) | ORAL | 0 refills | Status: DC

## 2023-07-24 NOTE — Addendum Note (Signed)
Addended by: Blanchard Kelch on: 07/24/2023 01:45 PM   Modules accepted: Orders

## 2023-07-30 DIAGNOSIS — Z419 Encounter for procedure for purposes other than remedying health state, unspecified: Secondary | ICD-10-CM | POA: Diagnosis not present

## 2023-08-05 IMAGING — CR DG CHEST 2V
2 series · 2 of 2 positions shown · non-contrast
Comparison: None.

CLINICAL DATA: Pt. Stated, I've had a cough and went to SOB for 2
days. Pt has nipple rings that they can not remove. SOB

EXAM:
CHEST - 2 VIEW

[chest pa]
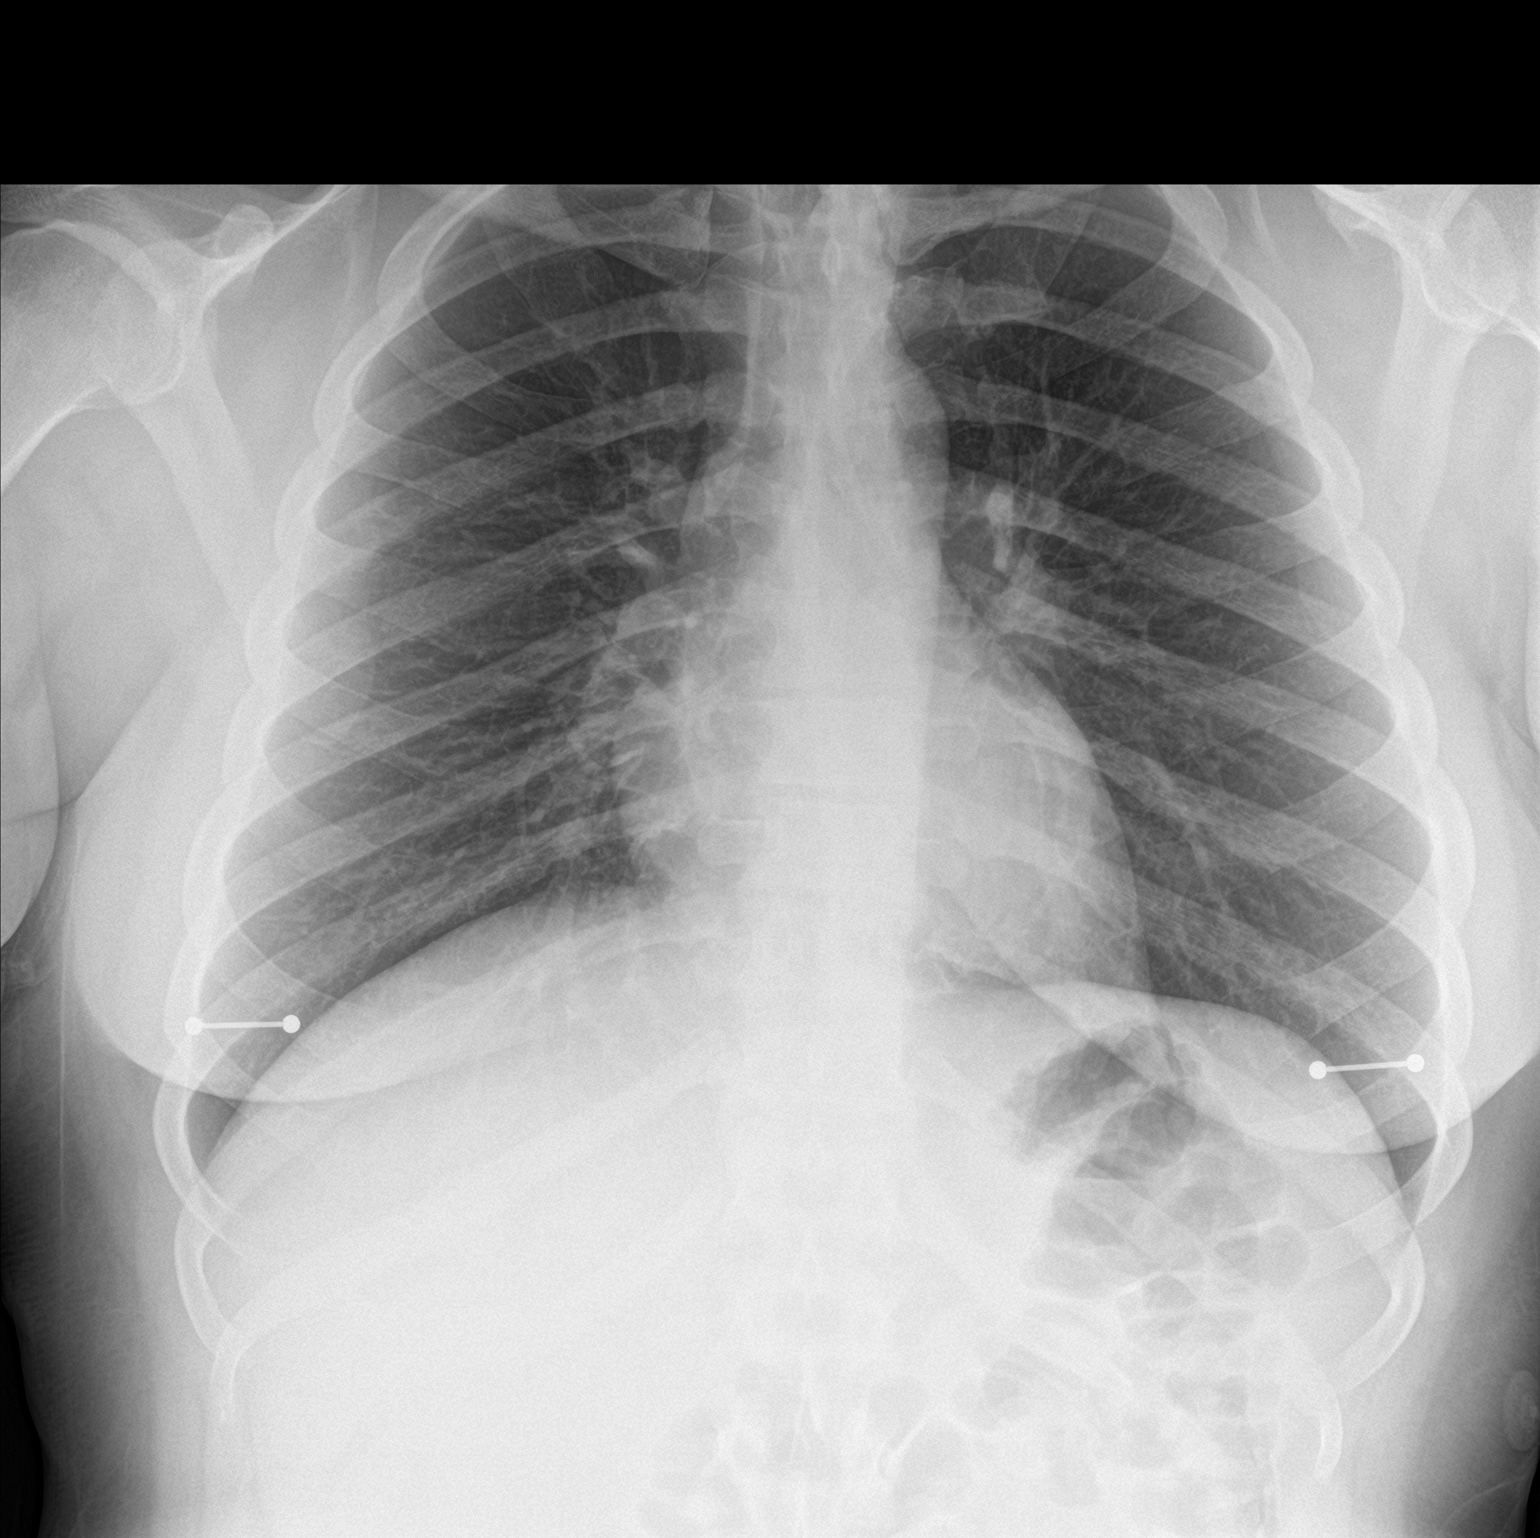

[chest lat]
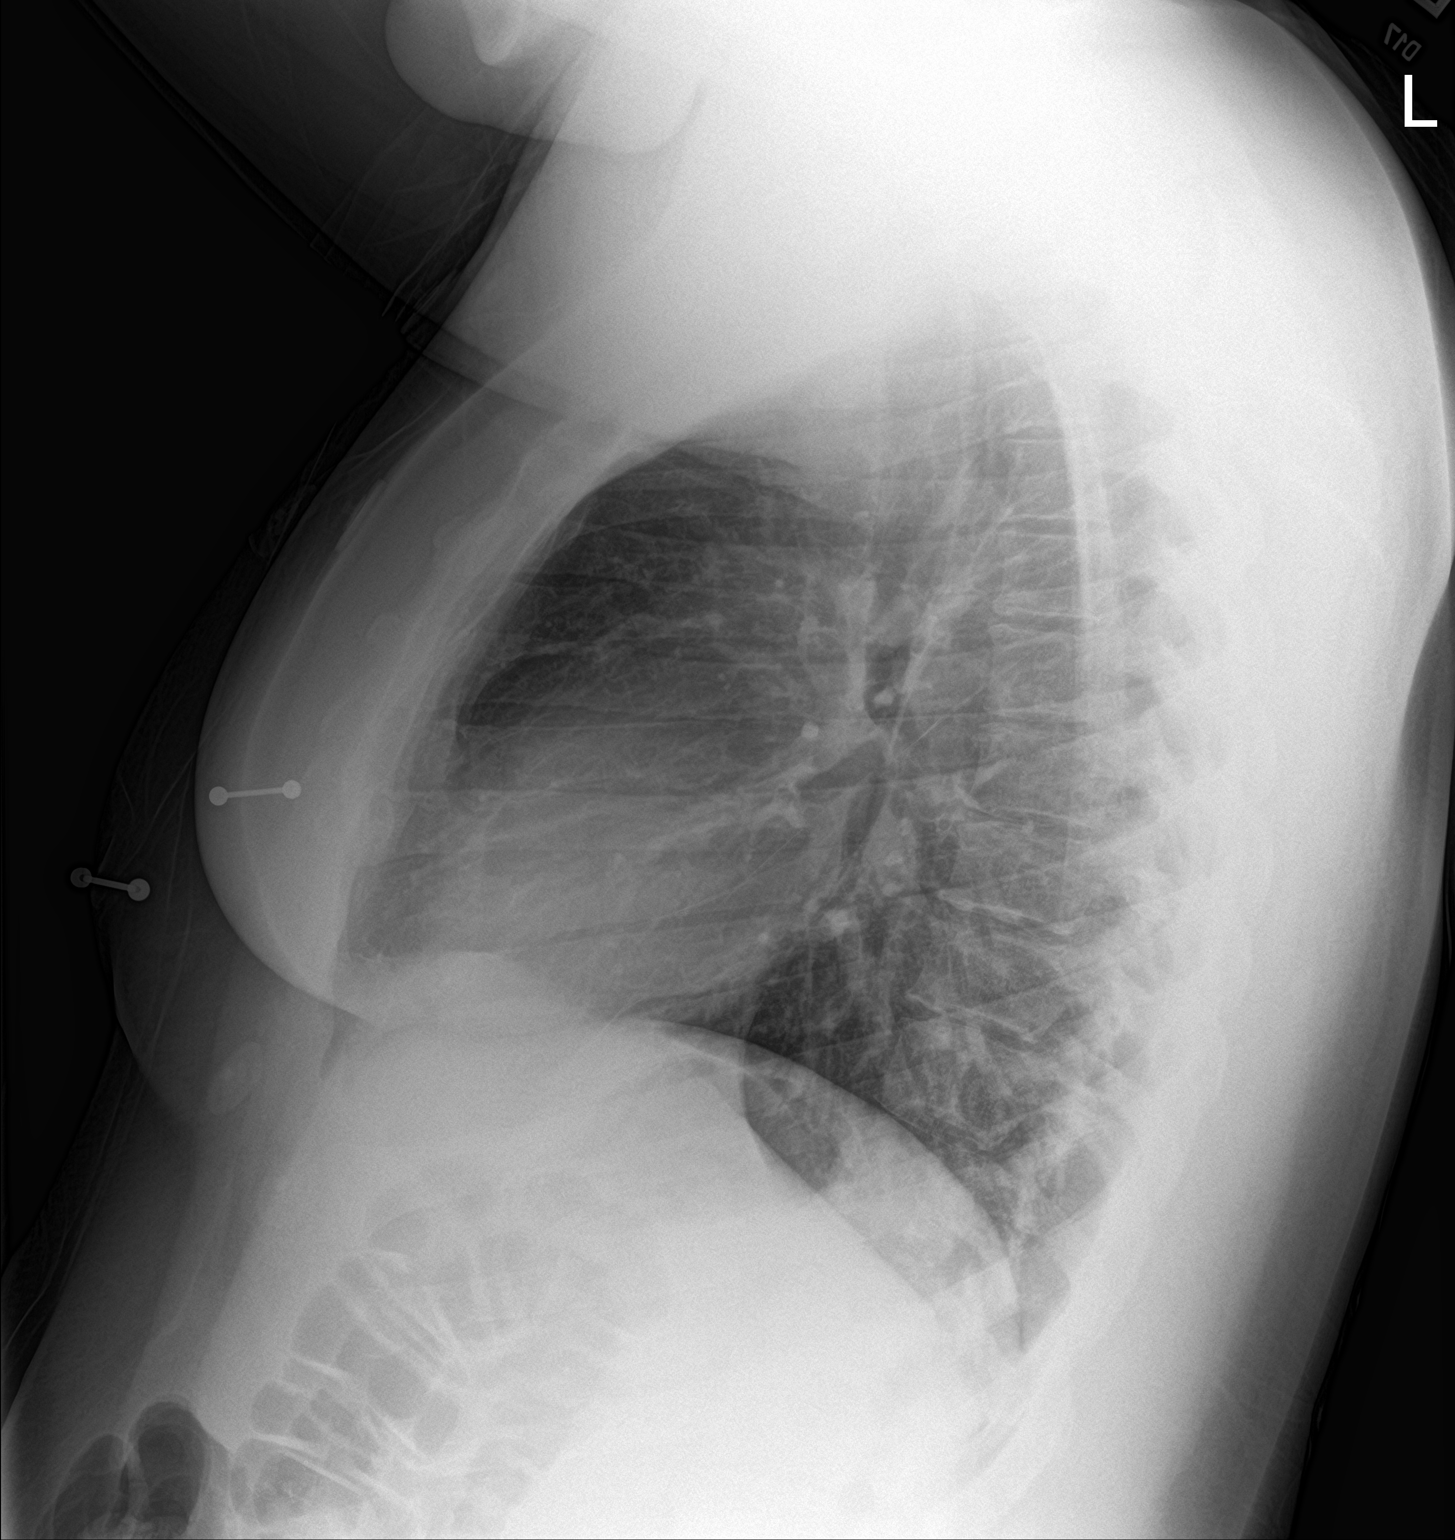

[2 of 2 positions shown; findings below may reference images not displayed]

FINDINGS: Normal mediastinum and cardiac silhouette. Normal pulmonary
vasculature. No evidence of effusion, infiltrate, or pneumothorax.
No acute bony abnormality.
IMPRESSION: No acute cardiopulmonary process.

## 2023-08-08 ENCOUNTER — Encounter (INDEPENDENT_AMBULATORY_CARE_PROVIDER_SITE_OTHER): Payer: Self-pay

## 2023-08-08 ENCOUNTER — Telehealth: Payer: Self-pay | Admitting: Infectious Diseases

## 2023-08-08 DIAGNOSIS — Z0289 Encounter for other administrative examinations: Secondary | ICD-10-CM | POA: Diagnosis not present

## 2023-08-08 NOTE — Telephone Encounter (Signed)
Gerald Lee called to confirm his upcoming appointment. He also requested Judeth Cornfield to reach out to him regarding additional ADA paperwork. She previously completed a form for his back but this new one will be for HIV. He asked if this could be completed by Monday for his job. He can be reached at [phone].

## 2023-08-09 NOTE — Progress Notes (Signed)
HPI: Gerald Lee is a 27 y.o. male who presents to the [facility] pharmacy clinic for [place] administration.  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   CABOTEGRAVIR & RILPIVIRINE ER (CABENUVA) 600 & 900 MG/3ML INJECTION    Inject 1 kit into the muscle every 2 (two) months.   DICYCLOMINE (BENTYL) 10 MG CAPSULE    Take 1 capsule (10 mg total) by mouth 4 (four) times daily -  before meals and at bedtime.   DOXYCYCLINE (VIBRA-TABS) 100 MG TABLET    Take 2 tablets (200 mg total) by mouth once as needed for up to 1 dose. Within 24 hours of at risk activity   LORATADINE (CLARITIN) 10 MG TABLET    Take 1 tablet (10 mg total) by mouth daily.   MELOXICAM (MOBIC) 15 MG TABLET    Take 1 tablet (15 mg total) by mouth daily.  Modified Medications   No medications on file  Discontinued Medications   No medications on file    Allergies: Allergies  Allergen Reactions   Ibuprofen Other (See Comments)    Per patient "can not take because it will shut down my kidneys"    Labs: Lab Results  Component Value Date   HIV1RNAQUANT Not Detected 06/20/2023   HIV1RNAQUANT Not Detected 12/15/2022   HIV1RNAQUANT Not Detected 06/16/2022    CD4TABS 1,042 06/20/2023   CD4TABS 959 06/16/2022   CD4TABS 916 08/19/2021    RPR and STI Lab Results  Component Value Date   LABRPR REACTIVE (A) 06/20/2023   LABRPR REACTIVE (A) 05/24/2023   LABRPR REACTIVE (A) 06/16/2022   LABRPR REACTIVE (A) 03/30/2022   LABRPR NON-REACTIVE 12/14/2021   RPRTITER 1:16 (H) 06/20/2023   RPRTITER 1:32 (H) 05/24/2023   RPRTITER 1:16 (H) 06/16/2022   RPRTITER 1:32 (H) 03/30/2022   RPRTITER 1:2 (H) 04/27/2021    STI Results GC CT  03/30/2022  1:57 PM Negative    Negative    Negative  Negative    Negative    Negative   04/27/2021  3:11 PM Positive    Positive    Negative  Negative    Negative    Negative   12/10/2020  3:36 PM Negative    Negative    Negative  Negative    Positive    Positive   10/16/2019 11:05 AM Negative  Negative   05/11/2019 12:00 AM **POSITIVE**  C **POSITIVE**  C  03/11/2019 12:00 AM Negative  Negative   01/06/2019 12:00 AM Negative  Negative   10/01/2018 12:00 AM **POSITIVE**  Negative   06/11/2018 12:00 AM Negative    Negative    Negative  Negative    Negative    Negative   02/16/2018 12:00 AM Negative  Negative   10/13/2017 12:00  AM Negative  Negative   09/11/2017 12:00 AM Negative  Negative   05/22/2017 12:00 AM Negative    Negative  Negative    **POSITIVE**   05/17/2017 12:00 AM Negative  Negative     C Corrected result   Multiple values from one day are sorted in reverse-chronological order    Hepatitis B Lab Results  Component Value Date   HEPBSAB REACTIVE (A) 03/11/2019   HEPBSAG NON-REACTIVE 05/22/2017   Hepatitis C Lab Results  Component Value Date   HEPCAB NON-REACTIVE 05/22/2017   Hepatitis A Lab Results  Component Value Date   HAV REACTIVE (A) 05/22/2017   Lipids: Lab Results  Component Value Date   CHOL 131 06/20/2023   TRIG 65 06/20/2023   HDL 48 06/20/2023   CHOLHDL 2.7 06/20/2023   LDLCALC 69 06/20/2023    TARGET DATE: The 22nd  Assessment: Gerald Lee presents today  for his maintenance Cabenuva injections. Past injections were tolerated well without issues. Last HIV RNA was undetectable in October.   Administered cabotegravir 600mg /29mL in left upper outer quadrant of the gluteal muscle. Administered rilpivirine 900 mg/15mL in the right upper outer quadrant of the gluteal muscle. No issues with injections. He will follow up in 2 months for next set of injections.  Plan: - Cabenuva injections administered - Next injections scheduled for 10/16/23 with me - Call with any issues or questions  Tabbetha Kutscher L. [name], PharmD, BCIDP, AAHIVP, CPP Clinical Pharmacist Practitioner Infectious Diseases Clinical Pharmacist [facility]

## 2023-08-09 NOTE — Telephone Encounter (Signed)
Please see the MyChart message reply(ies) for my assessment and plan.    This patient gave consent for this Medical Advice Message and is aware that it may result in a bill to their insurance company, as well as the possibility of receiving a bill for a co-payment or deductible. They are an established patient, but are not seeking medical advice exclusively about a problem treated during an in person or video visit in the last seven days. I did not recommend an in person or video visit within seven days of my reply.    I spent a total of 15 minutes cumulative time within 7 days through MyChart messaging.  Santi Troung, NP   

## 2023-08-14 ENCOUNTER — Ambulatory Visit: Payer: BLUE CROSS/BLUE SHIELD | Admitting: Pharmacist

## 2023-08-14 ENCOUNTER — Other Ambulatory Visit: Payer: Self-pay

## 2023-08-14 DIAGNOSIS — B20 Human immunodeficiency virus [HIV] disease: Secondary | ICD-10-CM

## 2023-08-14 DIAGNOSIS — Z21 Asymptomatic human immunodeficiency virus [HIV] infection status: Secondary | ICD-10-CM

## 2023-08-14 MED ORDER — CABOTEGRAVIR & RILPIVIRINE ER 600 & 900 MG/3ML IM SUER
1.0000 | Freq: Once | INTRAMUSCULAR | Status: AC
  Administered 2023-08-14: 1 via INTRAMUSCULAR

## 2023-08-21 ENCOUNTER — Other Ambulatory Visit (HOSPITAL_COMMUNITY): Payer: Self-pay

## 2023-08-30 DIAGNOSIS — Z419 Encounter for procedure for purposes other than remedying health state, unspecified: Secondary | ICD-10-CM | POA: Diagnosis not present

## 2023-08-31 ENCOUNTER — Other Ambulatory Visit (HOSPITAL_COMMUNITY): Payer: Self-pay

## 2023-09-04 ENCOUNTER — Ambulatory Visit (INDEPENDENT_AMBULATORY_CARE_PROVIDER_SITE_OTHER): Payer: BLUE CROSS/BLUE SHIELD | Admitting: Infectious Diseases

## 2023-09-04 ENCOUNTER — Other Ambulatory Visit: Payer: Self-pay

## 2023-09-04 DIAGNOSIS — R109 Unspecified abdominal pain: Secondary | ICD-10-CM | POA: Diagnosis not present

## 2023-09-04 DIAGNOSIS — Z21 Asymptomatic human immunodeficiency virus [HIV] infection status: Secondary | ICD-10-CM

## 2023-09-04 DIAGNOSIS — M545 Low back pain, unspecified: Secondary | ICD-10-CM

## 2023-09-04 DIAGNOSIS — Z0289 Encounter for other administrative examinations: Secondary | ICD-10-CM | POA: Diagnosis not present

## 2023-09-04 DIAGNOSIS — G8929 Other chronic pain: Secondary | ICD-10-CM
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
## 2023-09-04 NOTE — Telephone Encounter (Signed)
 Patient will see if she can get work to come in, but unsure if she will be able to

## 2023-09-25 ENCOUNTER — Ambulatory Visit: Payer: BLUE CROSS/BLUE SHIELD | Admitting: Physical Therapy

## 2023-09-25 NOTE — Therapy (Deleted)
OUTPATIENT PHYSICAL THERAPY THORACOLUMBAR EVALUATION   Patient Name: Gerald Lee MRN: [id number] DOB:1996-08-04, 28 y.o., male Today's Date: 09/25/2023  END OF SESSION:   Past Medical History:  Diagnosis Date   Asthma    Headache(784.0)    HIV (human immunodeficiency virus infection) (HCC)    HIV infection (HCC)    Orthostatic hypotension    Past Surgical History:  Procedure Laterality Date   CIRCUMCISION  1998   HERNIA REPAIR     Done between the ages of 80 or 8 years    NO PAST SURGERIES     Patient Active Problem List   Diagnosis Date Noted   Folliculitis 04/25/2023   PTSD (post-traumatic stress disorder) 12/13/2022   Cannabis use disorder, mild, abuse 12/13/2022   Depression 10/31/2022   Primary syphilis 03/30/2022   Traumatic injury of small intestine 01/06/2020   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  ONSET DATE: ***  SUBJECTIVE:                                                                                                                                                                                           SUBJECTIVE STATEMENT: ***  PERTINENT HISTORY:  ***  PAIN:  Are you having pain? {OPRCPAIN:27236}  PRECAUTIONS: {Therapy precautions:24002}  RED FLAGS: {PT Red Flags:29287}   WEIGHT BEARING RESTRICTIONS: {Yes ***/No:24003}  FALLS:  Has patient fallen in last 6 months? {fallsyesno:27318}  LIVING ENVIRONMENT: Lives with: {OPRC lives with:25569::"lives with their family"} Lives in: {Lives in:25570} Stairs: {opstairs:27293} Has following  equipment at home: {Assistive devices:23999}  OCCUPATION: ***  PLOF: {PLOF:24004}  PATIENT GOALS: ***  NEXT MD VISIT: ***  OBJECTIVE:  Note: Objective measures were completed at Evaluation unless otherwise noted.  DIAGNOSTIC FINDINGS:  ***  PATIENT SURVEYS:  {rehab surveys:24030}  COGNITION: Overall cognitive status: {cognition:24006}     SENSATION: {sensation:27233}  MUSCLE LENGTH: Hamstrings: Right *** deg; Left *** deg Maisie Fus test: Right *** deg; Left *** deg  POSTURE: {posture:25561}  PALPATION: ***  LUMBAR ROM:   AROM eval  Flexion   Extension   Right lateral flexion   Left lateral flexion   Right rotation   Left rotation    (Blank rows = not tested)  LOWER EXTREMITY ROM:     {AROM/PROM:27142}  Right eval  Left eval  Hip flexion    Hip extension    Hip abduction    Hip adduction    Hip internal rotation    Hip external rotation    Knee flexion    Knee extension    Ankle dorsiflexion    Ankle plantarflexion    Ankle inversion    Ankle eversion     (Blank rows = not tested)  LOWER EXTREMITY MMT:    MMT Right eval Left eval  Hip flexion    Hip extension    Hip abduction    Hip adduction    Hip internal rotation    Hip external rotation    Knee flexion    Knee extension    Ankle dorsiflexion    Ankle plantarflexion    Ankle inversion    Ankle eversion     (Blank rows = not tested)  LUMBAR SPECIAL TESTS:  {lumbar special test:25242}  FUNCTIONAL TESTS:  {Functional tests:24029}  GAIT: Distance walked: *** Assistive device utilized: {Assistive devices:23999} Level of assistance: {Levels of assistance:24026} Comments: ***  TREATMENT DATE: ***                                                                                                                                 PATIENT EDUCATION:  Education details: *** Person educated: {Person educated:25204} Education method: {Education Method:25205} Education comprehension:  {Education Comprehension:25206}  HOME EXERCISE PROGRAM: ***  ASSESSMENT:  CLINICAL IMPRESSION: Patient is a *** y.o. *** who was seen today for physical therapy evaluation and treatment for ***.   OBJECTIVE IMPAIRMENTS: {opptimpairments:25111}.   ACTIVITY LIMITATIONS: {activitylimitations:27494}  PARTICIPATION LIMITATIONS: {participationrestrictions:25113}  PERSONAL FACTORS: {Personal factors:25162} are also affecting patient's functional outcome.   REHAB POTENTIAL: {rehabpotential:25112}  CLINICAL DECISION MAKING: {clinical decision making:25114}  EVALUATION COMPLEXITY: {Evaluation complexity:25115}   GOALS: Goals reviewed with patient? {yes/no:20286}  SHORT TERM GOALS: Target date: ***  *** Baseline: Goal status: INITIAL  2.  *** Baseline:  Goal status: INITIAL  3.  *** Baseline:  Goal status: INITIAL  4.  *** Baseline:  Goal status: INITIAL  5.  *** Baseline:  Goal status: INITIAL  6.  *** Baseline:  Goal status: INITIAL  LONG TERM GOALS: Target date: ***  *** Baseline:  Goal status: INITIAL  2.  *** Baseline:  Goal status: INITIAL  3.  *** Baseline:  Goal status: INITIAL  4.  *** Baseline:  Goal status: INITIAL  5.  *** Baseline:  Goal status: INITIAL  6.  *** Baseline:  Goal status: INITIAL  PLAN:  PT FREQUENCY: {rehab frequency:25116}  PT DURATION: {rehab duration:25117}  PLANNED INTERVENTIONS: {rehab planned interventions:25118::"97110-Therapeutic exercises","97530- Therapeutic [phone]- Neuromuscular re-education","97535- Self JXBJ","47829- Manual therapy"}.  PLAN FOR NEXT SESSION: ***   Cailen Mihalik, PT 09/25/2023, 7:50 AM

## 2023-09-30 DIAGNOSIS — Z419 Encounter for procedure for purposes other than remedying health state, unspecified: Secondary | ICD-10-CM | POA: Diagnosis not present

## 2023-10-03 ENCOUNTER — Other Ambulatory Visit (HOSPITAL_COMMUNITY): Payer: Self-pay

## 2023-10-03 ENCOUNTER — Other Ambulatory Visit: Payer: Self-pay | Admitting: Pharmacist

## 2023-10-03 DIAGNOSIS — Z21 Asymptomatic human immunodeficiency virus [HIV] infection status: Secondary | ICD-10-CM

## 2023-10-03 MED ORDER — CABENUVA 600 & 900 MG/3ML IM SUER
1.0000 | INTRAMUSCULAR | 5 refills | Status: DC
  Filled 2023-10-04: qty 6, 34d supply, fill #0
  Filled 2023-12-04 – 2023-12-21 (×2): qty 6, 34d supply, fill #1

## 2023-10-04 ENCOUNTER — Other Ambulatory Visit (HOSPITAL_COMMUNITY): Payer: Self-pay

## 2023-10-04 ENCOUNTER — Other Ambulatory Visit: Payer: Self-pay

## 2023-10-04 NOTE — Progress Notes (Signed)
 Specialty Pharmacy Initial Fill Coordination Note  Gerald Lee is a 28 y.o. male contacted today regarding initial fill of specialty medication(s) Cabotegravir  & Rilpivirine  (Cabenuva )   Patient requested Courier to Provider Office   Delivery date: 10/06/23   Verified address: [address] [place] [address]   Medication will be filled on 10/05/23.   Patient is aware of 0.00 copayment.

## 2023-10-05 ENCOUNTER — Other Ambulatory Visit: Payer: Self-pay

## 2023-10-06 ENCOUNTER — Telehealth: Payer: Self-pay

## 2023-10-06 NOTE — Telephone Encounter (Signed)
 [facility] Patient Advocate Encounter  Patient's medications CABENUVA  have been couriered to [facility] from [facility] and will be administered at the patients appointment on 10/16/23.  Charmaine Sharps, CPhT Specialty Pharmacy [facility] for [facility] Phone: [phone] Fax:  [phone]

## 2023-10-09 ENCOUNTER — Ambulatory Visit: Payer: BLUE CROSS/BLUE SHIELD

## 2023-10-09 NOTE — Therapy (Deleted)
 OUTPATIENT PHYSICAL THERAPY THORACOLUMBAR EVALUATION   Patient Name: Gerald Lee MRN: [id number] DOB:[birth year], 28 y.o., male Today's Date: 10/09/2023  END OF SESSION:   Past Medical History:  Diagnosis Date   Asthma    Headache(784.0)    HIV (human immunodeficiency virus infection) (HCC)    HIV infection (HCC)    Orthostatic hypotension    Past Surgical History:  Procedure Laterality Date   CIRCUMCISION  1998   HERNIA REPAIR     Done between the ages of 89 or 8 years    NO PAST SURGERIES     Patient Active Problem List   Diagnosis Date Noted   Folliculitis 04/25/2023   PTSD (post-traumatic stress disorder) 12/13/2022   Cannabis use disorder, mild, abuse 12/13/2022   Depression 10/31/2022   Primary syphilis 03/30/2022   Traumatic injury of small intestine 01/06/2020   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  ONSET DATE: chronic  SUBJECTIVE:                                                                                                                                                                                           SUBJECTIVE STATEMENT: ***  PERTINENT HISTORY:  Chronic Low Back Pain Patient reports significant back pain, particularly in cold weather, that interferes with ability to work and perform daily activities. No recent imaging or treatment documented. -Referral to physical therapy for evaluation and treatment. -Encouraged  to follow up with primary care provider for further evaluation and potential referral to orthopedics. -Plain film reviewed from 2022 with no abnormalities   PAIN:  Are you having pain? {OPRCPAIN:27236}  PRECAUTIONS: None  RED FLAGS: None   WEIGHT BEARING RESTRICTIONS: No  FALLS:  Has patient fallen in last 6 months? No  OCCUPATION: ***  PLOF: {PLOF:24004}  PATIENT GOALS: ***  NEXT MD VISIT: TBD  OBJECTIVE:  Note: Objective measures were completed at Evaluation unless otherwise noted.  DIAGNOSTIC FINDINGS:  CLINICAL DATA:  Low back pain with left sciatica   EXAM: LUMBAR SPINE - COMPLETE 4+ VIEW   COMPARISON:  None.   FINDINGS: Lumbar vertebral body heights and alignment are maintained. No pars break. No significant disc space narrowing  or facet hypertrophy.   IMPRESSION: Negative.     Electronically Signed   By: Guadlupe Spanish M.D.   On: 08/09/2021 15:24  PATIENT SURVEYS:  Modified Oswestry ***    MUSCLE LENGTH: Hamstrings: Right *** deg; Left *** deg Maisie Fus test: Right *** deg; Left *** deg  POSTURE: {posture:25561}  PALPATION: ***  LUMBAR ROM:   AROM eval  Flexion   Extension   Right lateral flexion   Left lateral flexion   Right rotation   Left rotation    (Blank rows = not tested)  LOWER EXTREMITY ROM:     {AROM/PROM:27142}  Right eval Left eval  Hip flexion    Hip extension    Hip abduction    Hip adduction    Hip internal rotation    Hip external rotation    Knee flexion    Knee extension    Ankle dorsiflexion    Ankle plantarflexion    Ankle inversion    Ankle eversion     (Blank rows = not tested)  LOWER EXTREMITY MMT:    MMT Right eval Left eval  Hip flexion    Hip extension    Hip abduction    Hip adduction    Hip internal rotation    Hip external rotation    Knee flexion    Knee extension    Ankle dorsiflexion    Ankle plantarflexion    Ankle inversion    Ankle eversion     (Blank rows = not  tested)  LUMBAR SPECIAL TESTS:  Straight leg raise test: {pos/neg:25243} and Slump test: {pos/neg:25243}  FUNCTIONAL TESTS:  30 seconds chair stand test  GAIT: Distance walked: 52ftx2 Assistive device utilized: {Assistive devices:23999} Level of assistance: {Levels of assistance:24026} Comments: ***  TREATMENT DATE: ***                                                                                                                                 PATIENT EDUCATION:  Education details: *** Person educated: {Person educated:25204} Education method: {Education Method:25205} Education comprehension: {Education Comprehension:25206}  HOME EXERCISE PROGRAM: ***  ASSESSMENT:  CLINICAL IMPRESSION: Patient is a *** y.o. *** who was seen today for physical therapy evaluation and treatment for ***.   OBJECTIVE IMPAIRMENTS: {opptimpairments:25111}.   ACTIVITY LIMITATIONS: {activitylimitations:27494}  PERSONAL FACTORS: {Personal factors:25162} are also affecting patient's functional outcome.   REHAB POTENTIAL: Fair based on chronicity and body habitus  CLINICAL DECISION MAKING: Stable/uncomplicated  EVALUATION COMPLEXITY: Low   GOALS: Goals reviewed with patient? No  SHORT TERM GOALS: Target date: 10/30/2023  Patient to demonstrate independence in HEP  Baseline: Goal status: INITIAL  2.  *** Baseline:  Goal status: INITIAL  3.  *** Baseline:  Goal status: INITIAL  4.  *** Baseline:  Goal status: INITIAL  5.  *** Baseline:  Goal status: INITIAL  6.  *** Baseline:  Goal status: INITIAL  LONG TERM GOALS: Target date: 11/20/2023  Patient will score at least ***% on FOTO to signify clinically meaningful improvement in functional abilities.   Baseline:  Goal status: INITIAL  2.  Patient will increase 30s chair stand reps from *** to *** with/without arms to demonstrate and improved functional ability with less pain/difficulty as well as reduce fall risk.   Baseline:  Goal status: INITIAL  3.  Patient will acknowledge ***/10 pain at least once during episode of care   Baseline:  Goal status: INITIAL  4.  *** Baseline:  Goal status: INITIAL  5.  *** Baseline:  Goal status: INITIAL  6.  *** Baseline:  Goal status: INITIAL  PLAN:  PT FREQUENCY: 1-2x/week  PT DURATION: 6 weeks  PLANNED INTERVENTIONS: 97164- PT Re-evaluation, 97110-Therapeutic exercises, 97530- Therapeutic activity, [id number]- Neuromuscular re-education, 97535- Self Care, 28413- Manual therapy, [id number]- Gait training, Patient/Family education, and Spinal mobilization.  PLAN FOR NEXT SESSION: HEP review and update, manual techniques as appropriate, aerobic tasks, ROM and flexibility activities, strengthening and PREs, TPDN, gait and balance training as needed     Hildred Laser, PT 10/09/2023, 8:02 AM

## 2023-10-16 ENCOUNTER — Ambulatory Visit: Payer: BLUE CROSS/BLUE SHIELD | Admitting: Pharmacist

## 2023-10-17 ENCOUNTER — Other Ambulatory Visit: Payer: Self-pay

## 2023-10-17 ENCOUNTER — Ambulatory Visit (INDEPENDENT_AMBULATORY_CARE_PROVIDER_SITE_OTHER): Payer: BLUE CROSS/BLUE SHIELD | Admitting: Pharmacist

## 2023-10-17 DIAGNOSIS — B20 Human immunodeficiency virus [HIV] disease: Secondary | ICD-10-CM

## 2023-10-17 DIAGNOSIS — Z8619 Personal history of other infectious and parasitic diseases: Secondary | ICD-10-CM

## 2023-10-17 DIAGNOSIS — Z23 Encounter for immunization: Secondary | ICD-10-CM

## 2023-10-17 MED ORDER — CABOTEGRAVIR & RILPIVIRINE ER 600 & 900 MG/3ML IM SUER
1.0000 | Freq: Once | INTRAMUSCULAR | Status: AC
  Administered 2023-10-17: 1 via INTRAMUSCULAR

## 2023-10-17 NOTE — Progress Notes (Unsigned)
HPI: Gerald Lee is a 28 y.o. male who presents to the [facility] pharmacy clinic for [place] administration.  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   CABOTEGRAVIR & RILPIVIRINE ER (CABENUVA) 600 & 900 MG/3ML INJECTION    Inject 1 kit into the muscle every 2 (two) months.   DICYCLOMINE (BENTYL) 10 MG CAPSULE    Take 1 capsule (10 mg total) by mouth 4 (four) times daily -  before meals and at bedtime.   DOXYCYCLINE (VIBRA-TABS) 100 MG TABLET    Take 2 tablets (200 mg total) by mouth once as needed for up to 1 dose. Within 24 hours of at risk activity   LORATADINE (CLARITIN) 10 MG TABLET    Take 1 tablet (10 mg total) by mouth daily.   MELOXICAM (MOBIC) 15 MG TABLET    Take 1 tablet (15 mg total) by mouth daily.  Modified Medications   No medications on file  Discontinued Medications   No medications on file    Allergies: Allergies  Allergen Reactions   Ibuprofen Other (See Comments)    Per patient "can not take because it will shut down my kidneys"    Labs: Lab Results  Component Value Date   HIV1RNAQUANT Not Detected 06/20/2023   HIV1RNAQUANT Not Detected 12/15/2022   HIV1RNAQUANT Not Detected 06/16/2022    CD4TABS 1,042 06/20/2023   CD4TABS 959 06/16/2022   CD4TABS 916 08/19/2021    RPR and STI Lab Results  Component Value Date   LABRPR REACTIVE (A) 06/20/2023   LABRPR REACTIVE (A) 05/24/2023   LABRPR REACTIVE (A) 06/16/2022   LABRPR REACTIVE (A) 03/30/2022   LABRPR NON-REACTIVE 12/14/2021   RPRTITER 1:16 (H) 06/20/2023   RPRTITER 1:32 (H) 05/24/2023   RPRTITER 1:16 (H) 06/16/2022   RPRTITER 1:32 (H) 03/30/2022   RPRTITER 1:2 (H) 04/27/2021    STI Results GC CT  03/30/2022  1:57 PM Negative    Negative    Negative  Negative    Negative    Negative   04/27/2021  3:11 PM Positive    Positive    Negative  Negative    Negative    Negative   12/10/2020  3:36 PM Negative    Negative    Negative  Negative    Positive    Positive   10/16/2019 11:05 AM Negative  Negative   05/11/2019 12:00 AM **POSITIVE**  C **POSITIVE**  C  03/11/2019 12:00 AM Negative  Negative   01/06/2019 12:00 AM Negative  Negative   10/01/2018 12:00 AM **POSITIVE**  Negative   06/11/2018 12:00 AM Negative    Negative    Negative  Negative    Negative    Negative   02/16/2018 12:00 AM Negative  Negative   10/13/2017 12:00  AM Negative  Negative   09/11/2017 12:00 AM Negative  Negative   05/22/2017 12:00 AM Negative    Negative  Negative    **POSITIVE**   05/17/2017 12:00 AM Negative  Negative     C Corrected result    Hepatitis B Lab Results  Component Value Date   HEPBSAB REACTIVE (A) 03/11/2019   HEPBSAG NON-REACTIVE 05/22/2017   Hepatitis C Lab Results  Component Value Date   HEPCAB NON-REACTIVE 05/22/2017   Hepatitis A Lab Results  Component Value Date   HAV REACTIVE (A) 05/22/2017   Lipids: Lab Results  Component Value Date   CHOL 131 06/20/2023   TRIG 65 06/20/2023   HDL 48 06/20/2023   CHOLHDL 2.7 06/20/2023   LDLCALC 69 06/20/2023    TARGET DATE: December 19, 2023  Assessment: [name] presents today for his maintenance Cabenuva injections. Overall, he is doing well  today. Past injections were tolerated well without issues. Last HIV RNA was not detected on 06/20/23. We will check HIV RNA and RPR during his next visit. He declined STI testing today. No new partners. We discussed eligible vaccines today. He agreed to receive both PCV20 and Menveo (booster) today.  Administered cabotegravir 600mg /23mL in left upper outer quadrant of the gluteal muscle. Administered rilpivirine 900 mg/9mL in the right upper outer quadrant of the gluteal muscle. No issues with injections. He will follow up in 2 months for next set of injections.  Plan: - Cabenuva injections administered - Next injections scheduled for 12/05/2023 with Judeth Cornfield - Future injections scheduled for 02/13/2024 with Cassie - PCV20 vaccine administered (right deltoid) - Menveo vaccine (booster) administered (left deltoid) - Call with any issues or questions  Buddy Duty, PharmD [facility]

## 2023-10-28 DIAGNOSIS — Z419 Encounter for procedure for purposes other than remedying health state, unspecified: Secondary | ICD-10-CM | POA: Diagnosis not present

## 2023-11-07 ENCOUNTER — Other Ambulatory Visit: Payer: Self-pay | Admitting: Infectious Diseases

## 2023-11-07 MED ORDER — DICYCLOMINE HCL 10 MG PO CAPS: 10.0000 mg | ORAL_CAPSULE | Freq: Three times a day (TID) | ORAL | 2 refills | Status: DC

## 2023-12-04 ENCOUNTER — Other Ambulatory Visit: Payer: Self-pay

## 2023-12-04 ENCOUNTER — Other Ambulatory Visit (HOSPITAL_COMMUNITY): Payer: Self-pay

## 2023-12-04 NOTE — Progress Notes (Signed)
 Specialty Pharmacy Refill Coordination Note  Gerald Lee is a 28 y.o. male assessed today regarding refills of clinic administered specialty medication(s) Cabotegravir & Rilpivirine Renaldo Harrison)   Clinic requested Courier to Provider Office   Delivery date: 12/20/23   Verified address: [address] [place] [address]   Medication will be filled on 12/19/23.

## 2023-12-09 DIAGNOSIS — Z419 Encounter for procedure for purposes other than remedying health state, unspecified: Secondary | ICD-10-CM | POA: Diagnosis not present

## 2023-12-19 ENCOUNTER — Other Ambulatory Visit: Payer: Self-pay

## 2023-12-19 ENCOUNTER — Other Ambulatory Visit (HOSPITAL_COMMUNITY): Payer: Self-pay

## 2023-12-20 ENCOUNTER — Other Ambulatory Visit: Payer: Self-pay

## 2023-12-21 ENCOUNTER — Other Ambulatory Visit: Payer: Self-pay

## 2023-12-22 ENCOUNTER — Other Ambulatory Visit: Payer: Self-pay | Admitting: Pharmacist

## 2023-12-22 ENCOUNTER — Other Ambulatory Visit: Payer: Self-pay

## 2023-12-22 ENCOUNTER — Other Ambulatory Visit (HOSPITAL_COMMUNITY): Payer: Self-pay

## 2023-12-22 DIAGNOSIS — Z21 Asymptomatic human immunodeficiency virus [HIV] infection status: Secondary | ICD-10-CM

## 2023-12-22 MED ORDER — CABENUVA 600 & 900 MG/3ML IM SUER
1.0000 | INTRAMUSCULAR | 5 refills | Status: AC
  Filled 2023-12-22: qty 6, 34d supply, fill #0
  Filled 2023-12-22: qty 6, 60d supply, fill #0
  Filled 2024-02-12: qty 6, 34d supply, fill #1
  Filled 2024-04-08: qty 6, 34d supply, fill #2
  Filled 2024-06-07: qty 6, 34d supply, fill #3
  Filled 2024-07-26: qty 6, 34d supply, fill #4
  Filled 2024-10-04: qty 6, 34d supply, fill #5

## 2023-12-22 NOTE — Progress Notes (Signed)
 Per Abe Abed, "per pt he called medicaid to let them know he do not have any other insurance". Still getting a failed claim as of 04/25. May take 24-48 hours.

## 2023-12-25 ENCOUNTER — Encounter: Payer: Self-pay | Admitting: Infectious Diseases

## 2023-12-25 ENCOUNTER — Other Ambulatory Visit (HOSPITAL_COMMUNITY): Payer: Self-pay

## 2023-12-25 ENCOUNTER — Ambulatory Visit (INDEPENDENT_AMBULATORY_CARE_PROVIDER_SITE_OTHER): Payer: BLUE CROSS/BLUE SHIELD | Admitting: Infectious Diseases

## 2023-12-25 ENCOUNTER — Other Ambulatory Visit: Payer: Self-pay

## 2023-12-25 VITALS — BP 106/70 | HR 81 | Ht 73.0 in | Wt 318.0 lb

## 2023-12-25 DIAGNOSIS — B20 Human immunodeficiency virus [HIV] disease: Secondary | ICD-10-CM | POA: Diagnosis not present

## 2023-12-25 DIAGNOSIS — A510 Primary genital syphilis: Secondary | ICD-10-CM | POA: Diagnosis not present

## 2023-12-25 DIAGNOSIS — Z8619 Personal history of other infectious and parasitic diseases: Secondary | ICD-10-CM

## 2023-12-25 MED ORDER — CABOTEGRAVIR & RILPIVIRINE ER 600 & 900 MG/3ML IM SUER
1.0000 | Freq: Once | INTRAMUSCULAR | Status: AC
  Administered 2023-12-25: 1 via INTRAMUSCULAR

## 2023-12-25 MED ORDER — LORATADINE 10 MG PO TABS: 10.0000 mg | ORAL_TABLET | Freq: Every day | ORAL | 11 refills | Status: AC

## 2023-12-25 MED ORDER — DOXYCYCLINE HYCLATE 100 MG PO TABS: 200.0000 mg | ORAL_TABLET | Freq: Once | ORAL | 0 refills | Status: AC | PRN

## 2023-12-25 NOTE — Progress Notes (Signed)
 Name: Gerald Lee  DOB: [birth year]  MRN: [id number] PCP: Charle Congo, MD    Brief Narrative:  Gerald Lee is a 28 y.o. AA male with well controlled HIV, Dx 04-2017 with CD4 nadir 780.  Hep B sAg (-) High Risk: MSM.  Previous OIs: none  Previous Regimens:  Biktarvy  2018 --> suppressed  Cabenuva  injections 2022  Genotype:  02/2017 - no mutations  Subjective   Subjective:   Chief Complaint  Patient presents with   Follow-up    B20-Cabenuva     Discussed the use of AI scribe software for clinical note transcription with the patient, who gave verbal consent to proceed.  History of Present Illness   Gerald Hammack Strohm "[name]" is here for cabenuva  injection and HIV follow up care.   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 He has not had a recent vision exam, although both his parents wear glasses. Changes in temperature and sinus allergies can also trigger headaches. Sporadic sleep patterns due to his work schedule and transportation issues may contribute to his symptoms.  He has a history of asthma and has been unable to obtain his inhaler due to previous insurance issues. Now that his insurance is sorted out, he is seeking to get his inhaler.  He reports stress related to financial and housing issues, including increased rent and previous problems with his hot water  heater. He also faces challenges at work, including issues with ADA days and attendance documentation.  He has a new puppy named Chaos and is also caring for additional dogs, which he manages despite potential landlord issues.       Review of Systems  Constitutional:  Negative for chills and fever.  Musculoskeletal:  Positive for back pain.  Neurological:  Positive for headaches.  All other systems reviewed and are negative.    Past Medical History:  Diagnosis Date   Asthma    Headache(784.0)    HIV (human immunodeficiency  virus infection) (HCC)    HIV infection (HCC)    Orthostatic hypotension     Social History   Tobacco Use   Smoking status: Former    Current packs/day: 0.00    Types: Cigarettes, Cigars    Quit date: 11/29/2019    Years since quitting: 4.0   Smokeless tobacco: Never   Tobacco comments:    "only when super stressed"  Vaping Use   Vaping status: Some Days  Substance Use Topics   Alcohol use: Yes    Alcohol/week: 3.0 standard drinks of alcohol    Types: 3 Standard drinks or equivalent per week   Drug use: Yes    Frequency: 7.0 times per week    Types: Marijuana    Comment: daily: weekly patient goes through 7 grams    Allergies  Allergen Reactions   Ibuprofen  Other (See Comments)    Per patient "can not take because it will shut down my kidneys"      Objective   Objective:  Vitals:   12/25/23 1542  BP: 106/70  Pulse: 81  SpO2: 95%  Weight: (!) 318 lb (144.2 kg)  Height: 6\' 1"  (1.854 m)     Body mass index is 41.96 kg/m.  Physical Exam   Lab Results Lab Results  Component Value Date   WBC 7.3 06/20/2023   HGB 15.0 06/20/2023   HCT 46.9 06/20/2023   MCV 87.5 06/20/2023   PLT 240 06/20/2023  Lab Results  Component Value Date   CREATININE 1.06 06/20/2023   BUN 13 06/20/2023   NA 138 06/20/2023   K 3.8 06/20/2023   CL 104 06/20/2023   CO2 26 06/20/2023    Lab Results  Component Value Date   ALT 13 06/20/2023   AST 15 06/20/2023   ALKPHOS 123 10/30/2020   BILITOT 0.4 06/20/2023    Lab Results  Component Value Date   CHOL 131 06/20/2023   HDL 48 06/20/2023   LDLCALC 69 06/20/2023   TRIG 65 06/20/2023   CHOLHDL 2.7 06/20/2023   HIV 1 RNA Quant (Copies/mL)  Date Value  06/20/2023 Not Detected  12/15/2022 Not Detected  06/16/2022 Not Detected   CD4 T Cell Abs (/uL)  Date Value  06/20/2023 1,042  06/16/2022 959  08/19/2021 916   Lab Results  Component Value Date   HAV REACTIVE (A) 05/22/2017   Lab Results  Component Value  Date   HEPBSAG NON-REACTIVE 05/22/2017   HEPBSAB REACTIVE (A) 03/11/2019      Assessment and Plan    HIV Infection -  VL < 20 and CD4 > 500 -  Well maintained on Cabenuva  injections q2m. Update labs today. Vaccines updated with pharmacy earlier this year.  Declined sexual health screenings today.  -Will update RPR.  -Discussed DoxyPEP - will pick this up and have for on demand use.  -VL/CD4 today.   Asthma - Asthma management complicated by insurance issues affecting inhaler access. Insurance now active. - Prescribe albuterol  inhaler. - Ensure inhaler availability at [facility].  Allergic rhinitis - Allergic rhinitis likely contributing to headaches in the setting of high pollen levels, exacerbate symptoms. Antihistamines recommended. - Prescribe loratadine . - Provide antihistamine samples if available.  Headaches Headaches likely multifactorial: eye strain, sinus issues, stress, allergies. - Recommend vision exam. - Antihistamines daily  - Tylenol  PRN  - Advise use of blue light filtered glasses.      Meds ordered this encounter  Medications   cabotegravir  & rilpivirine  ER (CABENUVA ) 600 & 900 MG/3ML injection 1 kit   loratadine  (CLARITIN ) 10 MG tablet    Sig: Take 1 tablet (10 mg total) by mouth daily.    Dispense:  30 tablet    Refill:  11    Supervising Provider:   SNIDER, CYNTHIA [[id number]]   doxycycline  (VIBRA -TABS) 100 MG tablet    Sig: Take 2 tablets (200 mg total) by mouth once as needed for up to 1 dose. Within 24 hours of at risk activity    Dispense:  30 tablet    Refill:  0    Supervising Provider:   Liane Redman [phone]   Orders Placed This Encounter  Procedures   T-helper cells (CD4) count (not at [facility])   HIV 1 RNA quant-no reflex-bld   RPR   02/13/2024 next Cabenuva  injection with Pharmacy team.    Gibson Kurtz, MSN, NP-C [facility]  [email or web address] Pager:  [phone] Office: [phone] [facility] Main Line: [phone]   12/25/23

## 2023-12-25 NOTE — Patient Instructions (Addendum)
 Nice to see you   For your headaches -  Start taking an antihistamine once a day to see if that helps.  Would also recommend a vision exam - could be eye strain from the computers too  I am sorry we didn't have any samples of the allergy medication - stop by the pharmacy in the building for a cheaper option for it if you want to look there - any of them will be fine.   Your next injection appointment is on 02/13/2024 with our pharmacy team.

## 2023-12-26 ENCOUNTER — Telehealth: Payer: Self-pay

## 2023-12-26 NOTE — Telephone Encounter (Addendum)
 [facility] Patient Advocate Encounter  Patient's medications CABENUVA  have been couriered to [facility] from [facility] and will be administered at the patients appointment on 12/25/23.  Gerald Lee, CPhT Specialty Pharmacy Patient [facility] Phone: [phone] Fax:  [phone]

## 2023-12-28 DIAGNOSIS — F515 Nightmare disorder: Secondary | ICD-10-CM

## 2023-12-28 DIAGNOSIS — F4310 Post-traumatic stress disorder, unspecified: Secondary | ICD-10-CM

## 2023-12-28 LAB — HIV-1 RNA QUANT-NO REFLEX-BLD
HIV 1 RNA Quant: NOT DETECTED {copies}/mL
HIV-1 RNA Quant, Log: NOT DETECTED {Log_copies}/mL

## 2023-12-28 LAB — RPR TITER: RPR Titer: 1:2 {titer} — ABNORMAL HIGH

## 2023-12-28 LAB — T PALLIDUM AB: T Pallidum Abs: POSITIVE — AB

## 2023-12-28 LAB — T-HELPER CELLS (CD4) COUNT (NOT AT ARMC)
Absolute CD4: 931 {cells}/uL (ref 490–1740)
CD4 T Helper %: 34 % (ref 30–61)
Total lymphocyte count: 2760 {cells}/uL (ref 850–3900)

## 2023-12-28 LAB — RPR: RPR Ser Ql: REACTIVE — AB

## 2024-01-08 DIAGNOSIS — Z419 Encounter for procedure for purposes other than remedying health state, unspecified: Secondary | ICD-10-CM | POA: Diagnosis not present

## 2024-02-08 DIAGNOSIS — Z419 Encounter for procedure for purposes other than remedying health state, unspecified: Secondary | ICD-10-CM | POA: Diagnosis not present

## 2024-02-11 NOTE — Progress Notes (Signed)
 HPI: Gerald Lee is a 28 y.o. male who presents to the [facility] pharmacy clinic for Cabenuva  administration.  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   CABOTEGRAVIR  & RILPIVIRINE  ER (CABENUVA ) 600 & 900 MG/3ML INJECTION    Inject 1 kit into the muscle every 2 (two) months.   DOXYCYCLINE  (VIBRA -TABS) 100 MG TABLET    Take 2 tablets (200 mg total) by mouth once as needed for up to 1 dose. Within 24 hours of at risk activity   LORATADINE  (CLARITIN ) 10 MG TABLET    Take 1 tablet (10 mg total) by mouth daily.  Modified Medications   No medications on file  Discontinued Medications   No medications on file    Allergies: Allergies  Allergen Reactions   Ibuprofen  Other (See Comments)    Per patient can not take because it will shut down my kidneys    Labs: Lab Results  Component Value Date   HIV1RNAQUANT NOT DETECTED 12/25/2023   HIV1RNAQUANT Not Detected 06/20/2023   HIV1RNAQUANT Not Detected 12/15/2022   CD4TABS 1,042 06/20/2023   CD4TABS 959 06/16/2022   CD4TABS 916 08/19/2021    RPR and STI Lab Results  Component Value Date   LABRPR REACTIVE (A) 12/25/2023   LABRPR REACTIVE (A) 06/20/2023   LABRPR  REACTIVE (A) 05/24/2023   LABRPR REACTIVE (A) 06/16/2022   LABRPR REACTIVE (A) 03/30/2022   RPRTITER 1:2 (H) 12/25/2023   RPRTITER 1:16 (H) 06/20/2023   RPRTITER 1:32 (H) 05/24/2023   RPRTITER 1:16 (H) 06/16/2022   RPRTITER 1:32 (H) 03/30/2022    STI Results GC CT  03/30/2022  1:57 PM Negative    Negative    Negative  Negative    Negative    Negative   04/27/2021  3:11 PM Positive    Positive    Negative  Negative    Negative    Negative   12/10/2020  3:36 PM Negative    Negative    Negative  Negative    Positive    Positive   10/16/2019 11:05 AM Negative  Negative   05/11/2019 12:00 AM **POSITIVE**  C **POSITIVE**  C  03/11/2019 12:00 AM Negative  Negative   01/06/2019 12:00 AM Negative  Negative   10/01/2018 12:00 AM **POSITIVE**  Negative   06/11/2018 12:00 AM Negative    Negative    Negative  Negative    Negative    Negative   02/16/2018 12:00 AM Negative  Negative   10/13/2017 12:00 AM Negative  Negative   09/11/2017 12:00 AM Negative  Negative   05/22/2017 12:00 AM Negative    Negative  Negative    **POSITIVE**   05/17/2017 12:00 AM Negative  Negative     C Corrected result    Hepatitis  B Lab Results  Component Value Date   HEPBSAB REACTIVE (A) 03/11/2019   HEPBSAG NON-REACTIVE 05/22/2017   Hepatitis C Lab Results  Component Value Date   HEPCAB NON-REACTIVE 05/22/2017   Hepatitis A Lab Results  Component Value Date   HAV REACTIVE (A) 05/22/2017   Lipids: Lab Results  Component Value Date   CHOL 131 06/20/2023   TRIG 65 06/20/2023   HDL 48 06/20/2023   CHOLHDL 2.7 06/20/2023   LDLCALC 69 06/20/2023    TARGET DATE: 22nd  Assessment: Sernie presents today for his maintenance Cabenuva  injections. Past injections were tolerated well without issues. Last HIV RNA was undetectable in April. Doing well with no issues today related to HIV. He reports not being able to pick up Doxycycline  for STI prophylaxis due to cost.   He shared that his  partner is interested in starting HIV PrEP. Unfortunately, due to his partner's uninsured status and expiring funds for underinsured PrEP patients followed by clinic, he will not be able to receive PrEP at [facility]. Will refer to local health department or [facility]  He is eligible for Shingrix  today. He wishes to receive this injection today.   Administered cabotegravir  600mg /43mL in left upper outer quadrant of the gluteal muscle. Administered rilpivirine  900 mg/3mL in the right upper outer quadrant of the gluteal muscle. No issues with injections. He will follow up in 2 months for next set of injections.  Plan: - Cabenuva  injections administered - Shingrix  1/2 administered  - Patient was encouraged to have his partner reach out to the local health department or [facility] to initiate PrEP due to partner's uninsured status and the expiring clinic funds to support uninsured PrEP patients - Next injections scheduled for 08/19 with RPh - Call with any issues or questions  Tolu Lamount Bankson, PharmD [employer] PGY-1

## 2024-02-12 ENCOUNTER — Other Ambulatory Visit (HOSPITAL_COMMUNITY): Payer: Self-pay

## 2024-02-12 ENCOUNTER — Other Ambulatory Visit: Payer: Self-pay

## 2024-02-12 NOTE — Progress Notes (Signed)
 Specialty Pharmacy Refill Coordination Note  Gerald Lee is a 28 y.o. male assessed today regarding refills of clinic administered specialty medication(s) Cabotegravir  & Rilpivirine  (Cabenuva )   Clinic requested Courier to Provider Office   Delivery date: 02/13/24   Verified address: [address] [employer] [address] [place] [address]   Medication will be filled on 02/12/24.

## 2024-02-13 ENCOUNTER — Other Ambulatory Visit: Payer: Self-pay

## 2024-02-13 ENCOUNTER — Telehealth: Payer: Self-pay

## 2024-02-13 ENCOUNTER — Ambulatory Visit (INDEPENDENT_AMBULATORY_CARE_PROVIDER_SITE_OTHER): Payer: BLUE CROSS/BLUE SHIELD | Admitting: Pharmacist

## 2024-02-13 DIAGNOSIS — Z23 Encounter for immunization: Secondary | ICD-10-CM

## 2024-02-13 DIAGNOSIS — B20 Human immunodeficiency virus [HIV] disease: Secondary | ICD-10-CM

## 2024-02-13 MED ORDER — CABOTEGRAVIR & RILPIVIRINE ER 600 & 900 MG/3ML IM SUER
1.0000 | Freq: Once | INTRAMUSCULAR | Status: AC
  Administered 2024-02-13: 1 via INTRAMUSCULAR

## 2024-02-13 NOTE — Telephone Encounter (Signed)
 [facility] Patient Advocate Encounter  Patient's medications Cabenuva  have been couriered to [facility] from [facility] pharmacy and will be administered at the patients appointment on 02/13/24.  [name], CPhT Specialty Pharmacy Patient [facility] Phone: [phone] Fax:  [phone]

## 2024-03-09 DIAGNOSIS — Z419 Encounter for procedure for purposes other than remedying health state, unspecified: Secondary | ICD-10-CM | POA: Diagnosis not present

## 2024-03-26 ENCOUNTER — Ambulatory Visit (INDEPENDENT_AMBULATORY_CARE_PROVIDER_SITE_OTHER): Payer: Self-pay | Admitting: Licensed Clinical Social Worker

## 2024-03-26 DIAGNOSIS — F4310 Post-traumatic stress disorder, unspecified: Secondary | ICD-10-CM | POA: Diagnosis not present
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
## 2024-03-31 ENCOUNTER — Other Ambulatory Visit: Payer: Self-pay | Admitting: Infectious Diseases

## 2024-04-03 ENCOUNTER — Other Ambulatory Visit: Payer: Self-pay

## 2024-04-03 ENCOUNTER — Emergency Department (HOSPITAL_COMMUNITY)
Admission: EM | Admit: 2024-04-03 | Discharge: 2024-04-03 | Disposition: A | Discharge status: Home / Self Care | Attending: Emergency Medicine | Admitting: Emergency Medicine

## 2024-04-03 DIAGNOSIS — F1910 Other psychoactive substance abuse, uncomplicated: Secondary | ICD-10-CM | POA: Insufficient documentation

## 2024-04-03 DIAGNOSIS — Z21 Asymptomatic human immunodeficiency virus [HIV] infection status: Secondary | ICD-10-CM | POA: Insufficient documentation

## 2024-04-03 DIAGNOSIS — J45909 Unspecified asthma, uncomplicated: Secondary | ICD-10-CM | POA: Insufficient documentation

## 2024-04-03 DIAGNOSIS — R42 Dizziness and giddiness: Secondary | ICD-10-CM | POA: Insufficient documentation

## 2024-04-03 LAB — BASIC METABOLIC PANEL WITH GFR
Anion gap: 7 (ref 5–15)
BUN: 13 mg/dL (ref 6–20)
CO2: 26 mmol/L (ref 22–32)
Calcium: 9.6 mg/dL (ref 8.9–10.3)
Chloride: 104 mmol/L (ref 98–111)
Creatinine, Ser: 0.98 mg/dL (ref 0.61–1.24)
GFR, Estimated: >60 mL/min (ref >60)
Glucose, Bld: 109 mg/dL — ABNORMAL HIGH (ref 70–99)
Potassium: 4.2 mmol/L (ref 3.5–5.1)
Sodium: 137 mmol/L (ref 135–145)

## 2024-04-03 LAB — URINALYSIS, ROUTINE W REFLEX MICROSCOPIC
Bilirubin Urine: NEGATIVE
Glucose, UA: NEGATIVE mg/dL
Hgb urine dipstick: NEGATIVE
Ketones, ur: 5 mg/dL — AB
Leukocytes,Ua: NEGATIVE
Nitrite: NEGATIVE
Protein, ur: NEGATIVE mg/dL
Specific Gravity, Urine: 1.014 (ref 1.005–1.030)
pH: 6 (ref 5.0–8.0)

## 2024-04-03 LAB — RAPID URINE DRUG SCREEN, HOSP PERFORMED
Amphetamines: POSITIVE — AB
Barbiturates: NOT DETECTED
Benzodiazepines: NOT DETECTED
Cocaine: NOT DETECTED
Opiates: NOT DETECTED
Tetrahydrocannabinol: POSITIVE — AB

## 2024-04-03 LAB — CBC
HCT: 46.3 % (ref 39.0–52.0)
Hemoglobin: 14.8 g/dL (ref 13.0–17.0)
MCH: 28.6 pg (ref 26.0–34.0)
MCHC: 32 g/dL (ref 30.0–36.0)
MCV: 89.4 fL (ref 80.0–100.0)
Platelets: 224 K/uL (ref 150–400)
RBC: 5.18 MIL/uL (ref 4.22–5.81)
RDW: 13.2 % (ref 11.5–15.5)
WBC: 7.2 K/uL (ref 4.0–10.5)
nRBC: 0 % (ref 0.0–0.2)

## 2024-04-03 MED ORDER — SODIUM CHLORIDE 0.9 % IV BOLUS
1000.0000 mL | Freq: Once | INTRAVENOUS | Status: AC
  Administered 2024-04-03: 1000 mL via INTRAVENOUS

## 2024-04-03 NOTE — ED Provider Notes (Signed)
 [place] EMERGENCY DEPARTMENT AT [facility] Provider Note   CSN: [id number] Arrival date & time: 04/03/24  1319     Patient presents with: Dizziness   Gerald Lee is a 28 y.o. male.   Pt is a 28 yo male with pmhx significant for asthma, HIV, PTSD, depression, and anxiety.  Pt lost his job last week.  He's been more depressed, so he thought he'd give himself a pick-me-up with ice.  He did a booty bump of ice on Friday, 8/1 and again on 8/3.  He has been feeling palpitations and still does not feel right.  No si/hi.       Prior to Admission medications   Medication Sig Start Date End Date Taking? Authorizing Provider  albuterol  (VENTOLIN  HFA) 108 (90 Base) MCG/ACT inhaler INHALE 2 PUFFS INTO THE LUNGS EVERY 6 HOURS AS NEEDED FOR WHEEZING OR SHORTNESS OF BREATH 04/01/24   Melvenia Corean SAILOR, NP  cabotegravir  & rilpivirine  ER (CABENUVA ) 600 & 900 MG/3ML injection Inject 1 kit into the muscle every 2 (two) months. 12/22/23   Waddell Alan PARAS, RPH-CPP  doxycycline  (VIBRA -TABS) 100 MG tablet Take 2 tablets (200 mg total) by mouth once as needed for up to 1 dose. Within 24 hours of at risk activity 12/25/23   Melvenia Corean SAILOR, NP  loratadine  (CLARITIN ) 10 MG tablet Take 1 tablet (10 mg total) by mouth daily. 12/25/23   Melvenia Corean SAILOR, NP    Allergies: Ibuprofen     Review of Systems  Cardiovascular:  Positive for palpitations.  Neurological:  Positive for dizziness.  All other systems reviewed and are negative.   Updated Vital Signs BP (!) 143/85 (BP Location: Left Arm)   Pulse 92   Temp 97.8 F (36.6 C) (Oral)   Resp (!) 26   SpO2 97%   Physical Exam Vitals and nursing note reviewed.  Constitutional:      Appearance: Normal appearance. He is obese.  HENT:     Head: Normocephalic and atraumatic.     Right Ear: External ear normal.     Left Ear: External ear normal.     Nose: Nose normal.     Mouth/Throat:     Mouth: Mucous membranes are dry.  Eyes:      Extraocular Movements: Extraocular movements intact.     Conjunctiva/sclera: Conjunctivae normal.     Pupils: Pupils are equal, round, and reactive to light.  Cardiovascular:     Rate and Rhythm: Normal rate and regular rhythm.     Pulses: Normal pulses.     Heart sounds: Normal heart sounds.  Pulmonary:     Effort: Pulmonary effort is normal.     Breath sounds: Normal breath sounds.  Abdominal:     General: Abdomen is flat. Bowel sounds are normal.     Palpations: Abdomen is soft.  Musculoskeletal:        General: Normal range of motion.     Cervical back: Normal range of motion and neck supple.  Skin:    General: Skin is warm.     Capillary Refill: Capillary refill takes less than 2 seconds.  Neurological:     General: No focal deficit present.     Mental Status: He is alert and oriented to person, place, and time.  Psychiatric:        Mood and Affect: Mood normal.        Behavior: Behavior normal.        Thought Content: Thought content normal.  Judgment: Judgment normal.     (all labs ordered are listed, but only abnormal results are displayed) Labs Reviewed  BASIC METABOLIC PANEL WITH GFR - Abnormal; Notable for the following components:      Result Value   Glucose, Bld 109 (*)    All other components within normal limits  URINALYSIS, ROUTINE W REFLEX MICROSCOPIC - Abnormal; Notable for the following components:   Ketones, ur 5 (*)    All other components within normal limits  RAPID URINE DRUG SCREEN, HOSP PERFORMED - Abnormal; Notable for the following components:   Amphetamines POSITIVE (*)    Tetrahydrocannabinol POSITIVE (*)    All other components within normal limits  CBC    EKG: None  Radiology: No results found.   Procedures   Medications Ordered in the ED  sodium chloride  0.9 % bolus 1,000 mL (0 mLs Intravenous Stopped 04/03/24 1640)  sodium chloride  0.9 % bolus 1,000 mL (1,000 mLs Intravenous New Bag/Given 04/03/24 1802)                                     Medical Decision Making Amount and/or Complexity of Data Reviewed Labs: ordered.   This patient presents to the ED for concern of palpitations, this involves an extensive number of treatment options, and is a complaint that carries with it a high risk of complications and morbidity.  The differential diagnosis includes drugs, anemia, infection   Co morbidities that complicate the patient evaluation  asthma, HIV, PTSD, depression, and anxiety   Additional history obtained:  Additional history obtained from epic chart review External records from outside source obtained and reviewed including friend   Lab Tests:  I Ordered, and personally interpreted labs.  The pertinent results include:  cbc nl, bmp nl; ua with ketones; uds + amphetamines and mj   Cardiac Monitoring:  The patient was maintained on a cardiac monitor.  I personally viewed and interpreted the cardiac monitored which showed an underlying rhythm of: nsr   Medicines ordered and prescription drug management:  I ordered medication including ivfs  for sx  Reevaluation of the patient after these medicines showed that the patient improved I have reviewed the patients home medicines and have made adjustments as needed  Problem List / ED Course:  Polysubstance abuse:  pt encouraged to avoid meth and mj.  He is feeling better after fluids.  Return if worse.  F/u with pcp.   Reevaluation:  After the interventions noted above, I reevaluated the patient and found that they have :improved   Social Determinants of Health:  Lives at home   Dispostion:  After consideration of the diagnostic results and the patients response to treatment, I feel that the patent would benefit from discharge with outpatient f/u.       Final diagnoses:  Dizziness  Polysubstance abuse [facility])    ED Discharge Orders     None          Dean Clarity, MD 04/03/24 1901

## 2024-04-03 NOTE — Discharge Instructions (Signed)
 Avoid meth or ice.  Avoid marijuana.  Drink lots of fluids.

## 2024-04-03 NOTE — ED Triage Notes (Signed)
 Pt did ice for the first time Friday night. Did a 'booty bump'. Took 25mg  hydroxyzine  Saturday night. Sunday felt like 'seeing things in 3D', in the afternoon did another bowl of ice. Still feeling fuzzy. Denies NVD, denies CP and SHOB

## 2024-04-04 ENCOUNTER — Emergency Department (HOSPITAL_COMMUNITY)
Admission: EM | Admit: 2024-04-04 | Discharge: 2024-04-04 | Disposition: A | Discharge status: Home / Self Care | Attending: Emergency Medicine | Admitting: Emergency Medicine

## 2024-04-04 ENCOUNTER — Encounter (HOSPITAL_COMMUNITY): Payer: Self-pay | Admitting: Emergency Medicine

## 2024-04-04 ENCOUNTER — Other Ambulatory Visit: Payer: Self-pay

## 2024-04-04 DIAGNOSIS — R079 Chest pain, unspecified: Secondary | ICD-10-CM | POA: Diagnosis present

## 2024-04-04 DIAGNOSIS — F319 Bipolar disorder, unspecified: Secondary | ICD-10-CM | POA: Diagnosis not present

## 2024-04-04 DIAGNOSIS — F419 Anxiety disorder, unspecified: Secondary | ICD-10-CM | POA: Insufficient documentation

## 2024-04-04 DIAGNOSIS — F902 Attention-deficit hyperactivity disorder, combined type: Secondary | ICD-10-CM | POA: Diagnosis not present

## 2024-04-04 DIAGNOSIS — F4310 Post-traumatic stress disorder, unspecified: Secondary | ICD-10-CM | POA: Diagnosis not present

## 2024-04-04 MED ORDER — HYDROXYZINE HCL 10 MG PO TABS
10.0000 mg | ORAL_TABLET | Freq: Once | ORAL | Status: AC
  Administered 2024-04-04: 10 mg via ORAL
  Filled 2024-04-04: qty 1

## 2024-04-04 NOTE — ED Triage Notes (Signed)
 Pt in with reported medication reaction - states he was seen earlier today after Meth use and 25mg  of Hydroxyzine . Pt was then discharged and about ago he felt his heart was racing and a twinge of pain in L shoulder. Denies any other drug or medication use since

## 2024-04-04 NOTE — Discharge Instructions (Signed)
 Your workup this morning was consistent with anxiety. Please refrain from substance abuse. Please go to your therapy session today. Follow up as needed with your primary care provider.

## 2024-04-04 NOTE — ED Provider Notes (Signed)
 [place] EMERGENCY DEPARTMENT AT [facility] Provider Note   CSN: [id number] Arrival date & time: 04/04/24  9776     Patient presents with: Anxiety   Gerald Lee is a 28 y.o. male.  Patient with past medical history significant for PTSD, Insomnia, Anxiety, Substance Use Disorder presenting today w/ CC of Chest Pain. Patient reports that he awoke from sleep with tachycardia and felt a left sided tightness, both of which subsided shortly after. Patient reports history of waking up from sleep w/ anxiety d/t PTSD associated nightmares. Patient reports that he had a bad methamphetamine trip on Friday, took some of his friends Hydroxyzine  on Sunday to help with the anxiety, and then used more methamphetamines on Monday - patient endorses anxiety about drug reactions. No radiation of pain, N/V, abdominal pain, SOB, lightheadedness, dizziness, syncopal episodes.  Patient presented approximately 12 hours earlier with complaints of dizziness and palpitations and was evaluated at that time, Treated with fluids and discharged home.    Anxiety       Prior to Admission medications   Medication Sig Start Date End Date Taking? Authorizing Provider  albuterol  (VENTOLIN  HFA) 108 (90 Base) MCG/ACT inhaler INHALE 2 PUFFS INTO THE LUNGS EVERY 6 HOURS AS NEEDED FOR WHEEZING OR SHORTNESS OF BREATH 04/01/24   Melvenia Corean SAILOR, NP  cabotegravir  & rilpivirine  ER (CABENUVA ) 600 & 900 MG/3ML injection Inject 1 kit into the muscle every 2 (two) months. 12/22/23   Waddell Alan PARAS, RPH-CPP  doxycycline  (VIBRA -TABS) 100 MG tablet Take 2 tablets (200 mg total) by mouth once as needed for up to 1 dose. Within 24 hours of at risk activity 12/25/23   Melvenia Corean SAILOR, NP  loratadine  (CLARITIN ) 10 MG tablet Take 1 tablet (10 mg total) by mouth daily. 12/25/23   Melvenia Corean SAILOR, NP    Allergies: Ibuprofen     Review of Systems  Updated Vital Signs BP (!) 140/97   Pulse 94   Temp 98.2 F (36.8 C)  (Oral)   Resp (!) 22   Wt (!) 144.2 kg   SpO2 98%   BMI 41.94 kg/m   Physical Exam Vitals and nursing note reviewed.  Constitutional:      General: He is not in acute distress.    Appearance: He is well-developed.  HENT:     Head: Normocephalic and atraumatic.  Eyes:     Conjunctiva/sclera: Conjunctivae normal.  Cardiovascular:     Rate and Rhythm: Normal rate and regular rhythm.     Heart sounds: No murmur heard. Pulmonary:     Effort: Pulmonary effort is normal. No respiratory distress.     Breath sounds: Normal breath sounds.  Abdominal:     Palpations: Abdomen is soft.     Tenderness: There is no abdominal tenderness.  Musculoskeletal:        General: No swelling.     Cervical back: Neck supple.  Skin:    General: Skin is warm and dry.     Capillary Refill: Capillary refill takes less than 2 seconds.  Neurological:     Mental Status: He is alert.  Psychiatric:     Comments: Patient is anxious     (all labs ordered are listed, but only abnormal results are displayed) Labs Reviewed - No data to display  EKG: None  Radiology: No results found.   Procedures   Medications Ordered in the ED  hydrOXYzine  (ATARAX ) tablet 10 mg (10 mg Oral Given 04/04/24 0403)  Medical Decision Making Risk Prescription drug management.   This patient presents to the ED for concern of anxiety, this involves an extensive number of treatment options, and is a complaint that carries with it a high risk of complications and morbidity.    Co morbidities / Chronic conditions that complicate the patient evaluation  PTSD, anxiety, substance abuse, HIV   Additional history obtained:  Additional history obtained from EMR   Problem List / ED Course / Critical interventions / Medication management   I ordered medication including Atarax  Reevaluation of the patient after these medicines showed that the patient improved   Social  Determinants of Health:  Patient has Medicaid for his primary health insurance type   Test / Admission - Considered:  Patient with history of PTSD, plans to see a therapist later today. Patient feeling much better after resting here and being administered atarax . Patient likely anxious in relationship to recent substance use and underlying PTSD. No chest pain, shortness of breath at this time. Patient mildly tachypneic upon arrival but this resolved prior to medication administration. No clinical signs of ACS, PE, pneumonia at this time.       Final diagnoses:  Anxiety    ED Discharge Orders     None          Logan Ubaldo KATHEE DEVONNA 04/04/24 0449    Raford Lenis, MD 04/04/24 [id number]

## 2024-04-04 NOTE — Medical Student Note (Incomplete)
 [facility]-EMERGENCY DEPT Provider Student Note For educational purposes for Medical, PA and NP students only and not part of the legal medical record.   CSN: [id number] Arrival date & time: 04/04/24  9776      History   Chief Complaint Chief Complaint  Patient presents with   Anxiety    HPI Gerald Lee is a 28 y.o. male w/ PMH of PTSD, Insomnia, Anxiety, Substance Use Disorder presenting today w/ CC of Chest Pain. Patient reports that he awoke from sleep with tachycardia and felt a left sided tightness, both of which subsided shortly after. Patient reports history of waking up from sleep w/ anxiety d/t PTSD associated nightmares. Patient reports that he had a bad methamphetamine trip on Friday, took some of his friends Hydroxyzine  on Sunday to help with the anxiety, and then used more methamphetamines on Monday - patient endorses anxiety about drug reactions. No radiation of pain, N/V, abdominal pain, SOB, lightheadedness, dizziness, syncopal episodes.    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 Authorizing Provider  albuterol  (VENTOLIN  HFA) 108 (90 Base) MCG/ACT inhaler INHALE 2 PUFFS INTO THE LUNGS EVERY 6 HOURS AS NEEDED FOR WHEEZING OR SHORTNESS OF BREATH 04/01/24   Melvenia Corean SAILOR, NP  cabotegravir  & rilpivirine  ER (CABENUVA ) 600 & 900 MG/3ML injection Inject 1 kit into the muscle every 2 (two) months. 12/22/23   Waddell Alan PARAS, RPH-CPP  doxycycline  (VIBRA -TABS) 100 MG tablet Take 2 tablets (200 mg total) by mouth once as needed for up to 1 dose. Within 24 hours of at risk activity 12/25/23   Melvenia Corean SAILOR, NP  loratadine  (CLARITIN ) 10 MG tablet Take 1 tablet (10 mg total) by mouth daily. 12/25/23   Melvenia Corean SAILOR, NP    Family History Family History  Problem Relation Age of Onset   Stroke Mother    Hypertension Mother    Diabetes Father     Social History Social History   Tobacco Use   Smoking status: Former    Current packs/day: 0.00    Types: Cigarettes, Cigars    Quit date: 11/29/2019    Years since quitting: 4.3   Smokeless tobacco: Never   Tobacco comments:    only when super stressed  Vaping Use   Vaping status: Some Days  Substance Use Topics   Alcohol use: Yes    Alcohol/week: 3.0 standard drinks of alcohol    Types: 3 Standard  drinks or equivalent per week   Drug use: Yes    Frequency: 7.0 times per week    Types: Marijuana    Comment: daily: weekly patient goes through 7 grams     Allergies   Ibuprofen    Review of Systems Review of Systems   Physical Exam Updated Vital Signs BP (!) 140/97   Pulse 94   Temp 98.2 F (36.8 C) (Oral)   Resp (!) 22   Wt (!) 144.2 kg   SpO2 98%   BMI 41.94 kg/m   Physical Exam   ED Treatments / Results  Labs (all labs ordered are listed, but only abnormal results are displayed) Labs Reviewed - No data to display  EKG  Radiology No results found.  Procedures Procedures (including critical  care time)  Medications Ordered in ED Medications - No data to display   Initial Impression / Assessment and Plan / ED Course  I have reviewed the triage vital signs and the nursing notes.  Pertinent labs & imaging results that were available during my care of the patient were reviewed by me and considered in my medical decision making (see chart for details).     ***  Final Clinical Impressions(s) / ED Diagnoses   Final diagnoses:  None    New Prescriptions New Prescriptions   No medications on file

## 2024-04-08 ENCOUNTER — Other Ambulatory Visit (HOSPITAL_COMMUNITY): Payer: Self-pay

## 2024-04-08 ENCOUNTER — Other Ambulatory Visit: Payer: Self-pay

## 2024-04-08 NOTE — Progress Notes (Signed)
 Specialty Pharmacy Refill Coordination Note  Gerald Lee is a 28 y.o. male assessed today regarding refills of clinic administered specialty medication(s) Cabotegravir  & Rilpivirine  (Cabenuva )   Clinic requested Courier to Provider Office   Delivery date: 04/11/24   Verified address: [address] [employer] [address] [place] [address]   Medication will be filled on 04/10/24.

## 2024-04-09 DIAGNOSIS — Z419 Encounter for procedure for purposes other than remedying health state, unspecified: Secondary | ICD-10-CM | POA: Diagnosis not present

## 2024-04-10 DIAGNOSIS — F4310 Post-traumatic stress disorder, unspecified: Secondary | ICD-10-CM | POA: Diagnosis not present

## 2024-04-10 DIAGNOSIS — F319 Bipolar disorder, unspecified: Secondary | ICD-10-CM | POA: Diagnosis not present

## 2024-04-10 DIAGNOSIS — F902 Attention-deficit hyperactivity disorder, combined type: Secondary | ICD-10-CM | POA: Diagnosis not present

## 2024-04-11 ENCOUNTER — Telehealth: Payer: Self-pay

## 2024-04-11 NOTE — Telephone Encounter (Signed)
 [facility] Patient Advocate Encounter  Patient's medications Cabenuva  have been couriered to [facility] from [facility] and will be administered at the patients appointment on 04/16/24.  Gerald Lee, CPhT Specialty Pharmacy Patient [facility] for Infectious Disease Phone: [phone] Fax:  [phone]

## 2024-04-15 NOTE — Progress Notes (Unsigned)
 HPI: ED MANDICH is a 28 y.o. male who presents to the [facility] pharmacy clinic for Cabenuva  administration.  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   DOXYCYCLINE  (VIBRA -TABS) 100 MG TABLET    Take 2 tablets (200 mg total) by mouth once as needed for up to 1 dose. Within 24 hours of at risk activity   LORATADINE  (CLARITIN ) 10 MG TABLET    Take 1 tablet (10 mg total) by mouth daily.  Modified Medications   No medications on file  Discontinued Medications   No medications on file    Allergies: Allergies  Allergen Reactions   Ibuprofen  Other (See Comments)    Per patient can not take because it will shut down my kidneys    Labs: Lab Results  Component Value Date   HIV1RNAQUANT NOT DETECTED 12/25/2023   HIV1RNAQUANT Not Detected 06/20/2023   HIV1RNAQUANT Not Detected 12/15/2022   CD4TABS 1,042 06/20/2023   CD4TABS 959 06/16/2022   CD4TABS 916 08/19/2021    RPR and STI Lab Results  Component Value Date   LABRPR REACTIVE (A) 12/25/2023   LABRPR REACTIVE (A) 06/20/2023   LABRPR REACTIVE (A)  05/24/2023   LABRPR REACTIVE (A) 06/16/2022   LABRPR REACTIVE (A) 03/30/2022   RPRTITER 1:2 (H) 12/25/2023   RPRTITER 1:16 (H) 06/20/2023   RPRTITER 1:32 (H) 05/24/2023   RPRTITER 1:16 (H) 06/16/2022   RPRTITER 1:32 (H) 03/30/2022    STI Results GC CT  03/30/2022  1:57 PM Negative    Negative    Negative  Negative    Negative    Negative   04/27/2021  3:11 PM Positive    Positive    Negative  Negative    Negative    Negative   12/10/2020  3:36 PM Negative    Negative    Negative  Negative    Positive    Positive   10/16/2019 11:05 AM Negative  Negative   05/11/2019 12:00 AM **POSITIVE**  C **POSITIVE**  C  03/11/2019 12:00 AM Negative  Negative   01/06/2019 12:00 AM Negative  Negative   10/01/2018 12:00 AM **POSITIVE**  Negative   06/11/2018 12:00 AM Negative    Negative    Negative  Negative    Negative    Negative   02/16/2018 12:00 AM Negative  Negative   10/13/2017 12:00 AM Negative  Negative   09/11/2017 12:00 AM Negative  Negative   05/22/2017 12:00 AM Negative    Negative  Negative    **POSITIVE**   05/17/2017 12:00 AM Negative  Negative     C Corrected result    Hepatitis B  Lab Results  Component Value Date   HEPBSAB REACTIVE (A) 03/11/2019   HEPBSAG NON-REACTIVE 05/22/2017   Hepatitis C Lab Results  Component Value Date   HEPCAB NON-REACTIVE 05/22/2017   Hepatitis A Lab Results  Component Value Date   HAV REACTIVE (A) 05/22/2017   Lipids: Lab Results  Component Value Date   CHOL 131 06/20/2023   TRIG 65 06/20/2023   HDL 48 06/20/2023   CHOLHDL 2.7 06/20/2023   LDLCALC 69 06/20/2023    TARGET DATE: 22  Assessment: Christo presents today for his maintenance Cabenuva  injections. Past injections were tolerated well without issues. Last HIV RNA was undetectable in 12/25/2023. Doing well with no issues today.  Administered cabotegravir  600mg /23mL in left upper outer quadrant of the gluteal muscle. Administered rilpivirine  900 mg/3mL in the  right upper outer quadrant of the gluteal muscle. No issues with injections. Corrigan will follow up in 2 months for next set of injections.  Plan: - Cabenuva  injections administered - Next injections scheduled for *** - Call with any issues or questions  Elma Fail, PharmD PGY1 Clinical Pharmacist [facility]  04/15/2024 2:10 PM

## 2024-04-16 ENCOUNTER — Other Ambulatory Visit (HOSPITAL_COMMUNITY): Payer: Self-pay

## 2024-04-16 ENCOUNTER — Ambulatory Visit (INDEPENDENT_AMBULATORY_CARE_PROVIDER_SITE_OTHER): Admitting: Pharmacist

## 2024-04-16 ENCOUNTER — Other Ambulatory Visit: Payer: Self-pay

## 2024-04-16 DIAGNOSIS — B20 Human immunodeficiency virus [HIV] disease: Secondary | ICD-10-CM | POA: Diagnosis not present

## 2024-04-16 DIAGNOSIS — J45909 Unspecified asthma, uncomplicated: Secondary | ICD-10-CM

## 2024-04-16 DIAGNOSIS — F319 Bipolar disorder, unspecified: Secondary | ICD-10-CM | POA: Diagnosis not present

## 2024-04-16 DIAGNOSIS — Z23 Encounter for immunization: Secondary | ICD-10-CM

## 2024-04-16 DIAGNOSIS — F902 Attention-deficit hyperactivity disorder, combined type: Secondary | ICD-10-CM | POA: Diagnosis not present

## 2024-04-16 DIAGNOSIS — Z113 Encounter for screening for infections with a predominantly sexual mode of transmission: Secondary | ICD-10-CM

## 2024-04-16 DIAGNOSIS — F4310 Post-traumatic stress disorder, unspecified: Secondary | ICD-10-CM | POA: Diagnosis not present

## 2024-04-16 MED ORDER — ALBUTEROL SULFATE HFA 108 (90 BASE) MCG/ACT IN AERS
2.0000 | INHALATION_SPRAY | Freq: Four times a day (QID) | RESPIRATORY_TRACT | 0 refills | Status: AC | PRN
Start: 2024-04-16 — End: ?

## 2024-04-16 MED ORDER — CABOTEGRAVIR & RILPIVIRINE ER 600 & 900 MG/3ML IM SUER
1.0000 | Freq: Once | INTRAMUSCULAR | Status: AC
  Administered 2024-04-16: 1 via INTRAMUSCULAR

## 2024-04-18 LAB — HIV-1 RNA QUANT-NO REFLEX-BLD
HIV 1 RNA Quant: NOT DETECTED {copies}/mL
HIV-1 RNA Quant, Log: NOT DETECTED {Log_copies}/mL

## 2024-04-24 DIAGNOSIS — F319 Bipolar disorder, unspecified: Secondary | ICD-10-CM | POA: Diagnosis not present

## 2024-04-24 DIAGNOSIS — F4310 Post-traumatic stress disorder, unspecified: Secondary | ICD-10-CM | POA: Diagnosis not present

## 2024-04-24 DIAGNOSIS — F902 Attention-deficit hyperactivity disorder, combined type: Secondary | ICD-10-CM | POA: Diagnosis not present

## 2024-05-01 DIAGNOSIS — F319 Bipolar disorder, unspecified: Secondary | ICD-10-CM | POA: Diagnosis not present

## 2024-05-01 DIAGNOSIS — F902 Attention-deficit hyperactivity disorder, combined type: Secondary | ICD-10-CM | POA: Diagnosis not present

## 2024-05-01 DIAGNOSIS — F4310 Post-traumatic stress disorder, unspecified: Secondary | ICD-10-CM | POA: Diagnosis not present

## 2024-05-09 ENCOUNTER — Ambulatory Visit: Admission: EM | Admit: 2024-05-09 | Discharge: 2024-05-09 | Disposition: A | Discharge status: Home / Self Care

## 2024-05-09 ENCOUNTER — Encounter: Payer: Self-pay | Admitting: Emergency Medicine

## 2024-05-09 DIAGNOSIS — K6289 Other specified diseases of anus and rectum: Secondary | ICD-10-CM

## 2024-05-09 DIAGNOSIS — L304 Erythema intertrigo: Secondary | ICD-10-CM | POA: Diagnosis not present

## 2024-05-09 NOTE — ED Provider Notes (Signed)
 [facility]    CSN: [id number] Arrival date & time: 05/09/24  1900      History   Chief Complaint Chief Complaint  Patient presents with   Rash    HPI Gerald Lee is a 28 y.o. male.   Discussed the use of AI scribe software for clinical note transcription with the patient, who gave verbal consent to proceed.   Patient presents with concerns about a bump and discomfort in the perianal area. 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 He monitored the area, washing it with Dial soap, and the bump burst while he was sleeping. The area has been healing and without discomfort. Patient thinks that the affected area is due to the shorts rubbing against the area but would like for it to be looked at to ensure it isn't anything else.   The patient also reports a bump near his rectum. He can tell that it is present but denies any pain or significant discomfort. The patient's partner examined the area and described it as looking raw, possibly due to a hair bump. The patient expresses anxiety about the condition and has refrained from sexual encounters since noticing the issue. He would also like the area to be looked at to determine if it's something more serious.   The patient denies rectal pain, blood in stool, or genital sores. He mentions a history of chafing due to having thick thighs. The patient reports recent booty bump which is rectal administration of methamphetamine with water  about three weeks ago.   The following sections of the patient's history were reviewed and updated as appropriate: allergies, current medications, past family history, past medical history, past social history, past surgical history, and problem list.     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 Authorizing Provider  albuterol  (VENTOLIN  HFA) 108 (90 Base) MCG/ACT inhaler Inhale 2 puffs into the lungs every 6 (six) hours as needed for wheezing or shortness of breath. 04/16/24   Kuppelweiser, Cassie L, RPH-CPP  cabotegravir  & rilpivirine  ER (CABENUVA ) 600 & 900 MG/3ML injection Inject 1 kit into the muscle every 2 (two) months. 12/22/23   Waddell Alan PARAS, RPH-CPP  doxycycline  (VIBRA -TABS) 100 MG tablet Take 2 tablets (200 mg total) by mouth once as needed for up to 1 dose. Within 24 hours of at risk activity  12/25/23   Melvenia Corean SAILOR, NP  loratadine  (CLARITIN ) 10 MG tablet Take 1 tablet (10 mg total) by mouth daily. 12/25/23   Melvenia Corean SAILOR, NP    Family History Family History  Problem Relation Age of Onset   Stroke Mother    Hypertension Mother    Diabetes Father     Social History Social History   Tobacco Use   Smoking status: Former    Current packs/day: 0.00    Types: Cigarettes, Cigars    Quit date: 11/29/2019    Years since quitting: 4.4    Passive exposure: Current   Smokeless tobacco:  Never   Tobacco comments:    only when super stressed  Vaping Use   Vaping status: Some Days  Substance Use Topics   Alcohol use: Yes    Alcohol/week: 3.0 standard drinks of alcohol    Types: 3 Standard drinks or equivalent per week   Drug use: Yes    Frequency: 7.0 times per week    Types: Marijuana    Comment: daily: weekly patient goes through 7 grams     Allergies   Ibuprofen    Review of Systems Review of Systems  Gastrointestinal:  Negative for anal bleeding, blood in stool and rectal pain.  Genitourinary:  Negative for genital sores.  Skin:  Positive for rash and wound.  All other systems reviewed and are negative.    Physical Exam Triage Vital Signs ED Triage Vitals  Encounter Vitals Group     BP 05/09/24 1942 133/86     Girls Systolic BP Percentile --      Girls Diastolic BP Percentile --      Boys Systolic BP Percentile --      Boys Diastolic BP Percentile --      Pulse Rate 05/09/24 1942 83     Resp 05/09/24 1942 18     Temp 05/09/24 1942 98.6 F (37 C)     Temp Source 05/09/24 1942 Oral     SpO2 05/09/24 1942 99 %     Weight 05/09/24 1939 (!) 317 lb 14.5 oz (144.2 kg)     Height --      Head Circumference --      Peak Flow --      Pain Score 05/09/24 1939 0     Pain Loc --      Pain Education --      Exclude from Growth Chart --    No data found.  Updated Vital Signs BP 133/86 (BP Location: Left Arm)   Pulse 83   Temp 98.6 F (37 C) (Oral)   Resp 18   Wt (!) 317 lb 14.5 oz (144.2 kg)   SpO2 99%   BMI 41.94 kg/m   Visual Acuity Right Eye Distance:   Left Eye Distance:   Bilateral Distance:    Right Eye Near:   Left Eye Near:    Bilateral Near:     Physical Exam Vitals reviewed. Exam conducted with a chaperone present (Joss, RN).  Constitutional:      General: He is awake. He is not in acute distress.    Appearance: Normal appearance. He is well-developed. He is obese. He is not ill-appearing, toxic-appearing or diaphoretic.   HENT:     Head: Normocephalic.     Right Ear: Hearing normal.     Left Ear: Hearing normal.     Nose: Nose normal.     Mouth/Throat:     Mouth: Mucous membranes are  moist.  Eyes:     General: Vision grossly intact.     Conjunctiva/sclera: Conjunctivae normal.  Cardiovascular:     Rate and Rhythm: Normal rate and regular rhythm.     Heart sounds: Normal heart sounds.  Pulmonary:     Effort: Pulmonary effort is normal.     Breath sounds: Normal breath sounds and air entry.  Genitourinary:    Rectum: No tenderness.      Comments: There is a small, round, nontender mass noted at the perianal region. The lesion is soft, mobile, and without any erythema, warmth, drainage, or fluctuance.  The perineal region at the base of the scrotum shows mild skin irritation with maceration. There is no associated drainage, warmth, fluctuance, or foul odor.  Musculoskeletal:        General: Normal range of motion.     Cervical back: Full passive range of motion without pain, normal range of motion and neck supple.  Skin:    General: Skin is warm and dry.  Neurological:     General: No focal deficit present.     Mental Status: He is alert and oriented to person, place, and time.  Psychiatric:        Speech: Speech normal.        Behavior: Behavior is cooperative.      UC Treatments / Results  Labs (all labs ordered are listed, but only abnormal results are displayed) Labs Reviewed - No data to display  EKG   Radiology No results found.  Procedures Procedures (including critical care time)  Medications Ordered in UC Medications - No data to display  Initial Impression / Assessment and Plan / UC Course  I have reviewed the triage vital signs and the nursing notes.  Pertinent labs & imaging results that were available during my care of the patient were reviewed by me and considered in my medical decision making (see chart for details).     The patient presents with a perianal  cyst that is nontender and without evidence of infection. Examination revealed no erythema, warmth, drainage, or induration. The benign nature of the finding was discussed, and reassurance provided. Supportive care and observation were recommended, with instructions to return for reevaluation should the lesion become painful, increase in size, develop drainage, or be associated with fever.  The patient also has intertrigo at the base of the scrotum within the perineal region. No signs of secondary bacterial or fungal infection are present. Reassurance was provided, and supportive measures were reviewed, including maintaining the area clean and dry, using loose-fitting clothing, and minimizing prolonged moisture or friction. Barrier creams such as zinc  oxide or petroleum jelly may be applied for protection. The patient was advised to monitor closely and seek reevaluation if symptoms progress, particularly if redness, drainage, pain, swelling, or fever develop, which may indicate superimposed infection.  Today's evaluation has revealed no signs of a dangerous process. Discussed diagnosis with patient and/or guardian. Patient and/or guardian aware of their diagnosis, possible red flag symptoms to watch out for and need for close follow up. Patient and/or guardian understands verbal and written discharge instructions. Patient and/or guardian comfortable with plan and disposition.  Patient and/or guardian has a clear mental status at this time, good insight into illness (after discussion and teaching) and has clear judgment to make decisions regarding their care  Documentation was completed with the aid of voice recognition software. Transcription may contain typographical errors.  Final Clinical Impressions(s) / UC Diagnoses   Final diagnoses:  Intertrigo  Perianal cyst     Discharge Instructions      You were seen today for skin irritation at the base of the scrotum in the perineum, which is  consistent with intertrigo. This condition happens when skin rubs together in warm, moist areas and becomes red and irritated. There are no signs of infection at this time. At home, gently wash the area once daily with mild soap and water , then pat dry completely. Keep the skin clean, cool, and dry. Wearing loose cotton underwear and avoiding prolonged moisture, such as from sweating, will help healing. You may use a barrier ointment such as petroleum jelly or zinc  oxide to reduce friction. Follow up with your primary care provider if the rash does not improve in one to two weeks. Go to the emergency department if the area becomes very painful, starts draining pus, or if you develop fever or spreading redness.  You were also seen today for a bump near the rectum, which appears to be a perianal cyst. At this time it is not infected and does not require any specific treatment. Keep the area clean and dry, and wear loose-fitting clothing to reduce irritation. No ointments or creams are needed unless directed by your provider. Monitor the area for changes, including increased size, redness, pain, drainage, or fever. If these occur, contact your primary care provider or return for reevaluation. Go to the emergency department if you develop severe pain, spreading redness, high fever, or sudden swelling.     ED Prescriptions   None    PDMP not reviewed this encounter.   Iola Waller, [place] 05/10/24 [phone]

## 2024-05-09 NOTE — Discharge Instructions (Addendum)
 You were seen today for skin irritation at the base of the scrotum in the perineum, which is consistent with intertrigo. This condition happens when skin rubs together in warm, moist areas and becomes red and irritated. There are no signs of infection at this time. At home, gently wash the area once daily with mild soap and water , then pat dry completely. Keep the skin clean, cool, and dry. Wearing loose cotton underwear and avoiding prolonged moisture, such as from sweating, will help healing. You may use a barrier ointment such as petroleum jelly or zinc  oxide to reduce friction. Follow up with your primary care provider if the rash does not improve in one to two weeks. Go to the emergency department if the area becomes very painful, starts draining pus, or if you develop fever or spreading redness.  You were also seen today for a bump near the rectum, which appears to be a perianal cyst. At this time it is not infected and does not require any specific treatment. Keep the area clean and dry, and wear loose-fitting clothing to reduce irritation. No ointments or creams are needed unless directed by your provider. Monitor the area for changes, including increased size, redness, pain, drainage, or fever. If these occur, contact your primary care provider or return for reevaluation. Go to the emergency department if you develop severe pain, spreading redness, high fever, or sudden swelling.

## 2024-05-09 NOTE — ED Triage Notes (Signed)
 Pt presents c/o rash x 7 days. Pt reports he thought initially they were hair bumps but it has taken a long time to heal. Pt says it was 2 bumps that popped. Pt says there is no pain with the bumps.Pt says he has noticed another area near rectum that feels chaffed. Pt wants to have bumps looked at.

## 2024-05-10 DIAGNOSIS — F4310 Post-traumatic stress disorder, unspecified: Secondary | ICD-10-CM | POA: Diagnosis not present

## 2024-05-10 DIAGNOSIS — Z419 Encounter for procedure for purposes other than remedying health state, unspecified: Secondary | ICD-10-CM | POA: Diagnosis not present

## 2024-05-10 DIAGNOSIS — F319 Bipolar disorder, unspecified: Secondary | ICD-10-CM | POA: Diagnosis not present

## 2024-05-10 DIAGNOSIS — F902 Attention-deficit hyperactivity disorder, combined type: Secondary | ICD-10-CM | POA: Diagnosis not present

## 2024-05-13 ENCOUNTER — Emergency Department (HOSPITAL_COMMUNITY)
Admission: EM | Admit: 2024-05-13 | Discharge: 2024-05-13 | Disposition: A | Discharge status: Home / Self Care | Attending: Emergency Medicine | Admitting: Emergency Medicine

## 2024-05-13 ENCOUNTER — Other Ambulatory Visit: Payer: Self-pay

## 2024-05-13 ENCOUNTER — Encounter (HOSPITAL_COMMUNITY): Payer: Self-pay

## 2024-05-13 DIAGNOSIS — F4310 Post-traumatic stress disorder, unspecified: Secondary | ICD-10-CM | POA: Diagnosis not present

## 2024-05-13 DIAGNOSIS — F319 Bipolar disorder, unspecified: Secondary | ICD-10-CM | POA: Diagnosis not present

## 2024-05-13 DIAGNOSIS — K649 Unspecified hemorrhoids: Secondary | ICD-10-CM | POA: Diagnosis present

## 2024-05-13 DIAGNOSIS — F902 Attention-deficit hyperactivity disorder, combined type: Secondary | ICD-10-CM | POA: Diagnosis not present

## 2024-05-13 DIAGNOSIS — K610 Anal abscess: Secondary | ICD-10-CM | POA: Diagnosis not present

## 2024-05-13 MED ORDER — AMOXICILLIN-POT CLAVULANATE 875-125 MG PO TABS: 1.0000 | ORAL_TABLET | Freq: Two times a day (BID) | ORAL | 0 refills | Status: AC

## 2024-05-13 MED ORDER — AMOXICILLIN-POT CLAVULANATE 875-125 MG PO TABS
1.0000 | ORAL_TABLET | Freq: Once | ORAL | Status: AC
  Administered 2024-05-13: 1 via ORAL
  Filled 2024-05-13: qty 1

## 2024-05-13 NOTE — Discharge Instructions (Signed)
 You were seen in the ER for your perianal pain. You have a perianal abscess, which is draining. Please take the prescribed antibiotic for the entire course and return to the ER with any new severe symptoms.

## 2024-05-13 NOTE — ED Triage Notes (Signed)
 Pt states he was diagnosed with a hemorrhoid and it popped tonight. States he is having bloody discharge and some pain.

## 2024-05-13 NOTE — ED Provider Notes (Addendum)
 [place] EMERGENCY DEPARTMENT AT [facility] Provider Note   CSN: [id number] Arrival date & time: 05/13/24  0157     Patient presents with: Hemorrhoids   [name] A Longton is a 28 y.o. male with history of HIV receiving Cabenuva  injections who presents with concern for discomfort and drainage near his anus which she was told urgent care was a hemorrhoid but he states that it popped today and is leaking some pink and bloody fluid.  States that it popped after he sat in a hot bath.  States that he has been having issues with chafing between his legs and buttocks with the heat and sweating, feels this may have been the precipitant for his symptoms.  He is accompanied by his partner at the bedside.  Patient is a recipient for anal intercourse but has not had any intercourse since onset of symptoms.    HPI     Prior to Admission medications   Medication Sig Start Date End Date Taking? Authorizing Provider  amoxicillin -clavulanate (AUGMENTIN ) 875-125 MG tablet Take 1 tablet by mouth every 12 (twelve) hours for 7 days. 05/13/24 05/20/24 Yes Keajah Killough, Pleasant SAUNDERS, PA-C  albuterol  (VENTOLIN  HFA) 108 (90 Base) MCG/ACT inhaler Inhale 2 puffs into the lungs every 6 (six) hours as needed for wheezing or shortness of breath. 04/16/24   Kuppelweiser, Cassie L, RPH-CPP  cabotegravir  & rilpivirine  ER (CABENUVA ) 600 & 900 MG/3ML injection Inject 1 kit into the muscle every 2 (two) months. 12/22/23   Waddell Alan PARAS, RPH-CPP  doxycycline  (VIBRA -TABS) 100 MG tablet Take 2 tablets (200 mg total) by mouth once as needed for up to 1 dose. Within 24 hours of at risk activity 12/25/23   Melvenia Corean SAILOR, NP  loratadine  (CLARITIN ) 10 MG tablet Take 1 tablet (10 mg total) by mouth daily. 12/25/23   Melvenia Corean SAILOR, NP    Allergies: Ibuprofen     Review of Systems  Genitourinary:  Negative for decreased urine volume, penile discharge, penile pain, penile swelling, scrotal swelling, testicular pain and  urgency.       ? hemorrhoid    Updated Vital Signs BP (!) 162/110   Pulse 76   Temp 98.8 F (37.1 C) (Oral)   Resp 17   Ht 6' 1 (1.854 m)   Wt (!) 144.2 kg   SpO2 99%   BMI 41.94 kg/m   Physical Exam Vitals and nursing note reviewed. Exam conducted with a chaperone present (ED RN Ashley).  Constitutional:      Appearance: He is not ill-appearing or toxic-appearing.  HENT:     Head: Normocephalic and atraumatic.  Eyes:     General: No scleral icterus.       Right eye: No discharge.        Left eye: No discharge.     Conjunctiva/sclera: Conjunctivae normal.  Pulmonary:     Effort: Pulmonary effort is normal.     Breath sounds: Normal breath sounds.  Genitourinary:    Comments: Small perianal abscess in the 10 o'clock position, open and draining with purulent and small volume bloody drainage.  Internal DRE without any fullness or tenderness.  Clinically does not appear to be tracking along the rectum. Skin:    General: Skin is warm and dry.     Capillary Refill: Capillary refill takes less than 2 seconds.  Neurological:     General: No focal deficit present.     Mental Status: He is alert and oriented to person, place, and time.  Psychiatric:        Mood and Affect: Mood normal.     (all labs ordered are listed, but only abnormal results are displayed) Labs Reviewed - No data to display  EKG: None  Radiology: No results found.   Procedures   Medications Ordered in the ED  amoxicillin -clavulanate (AUGMENTIN ) 875-125 MG per tablet 1 tablet (1 tablet Oral Given 05/13/24 0519)                                    Medical Decision Making 28 year old with swelling and pain near the anus.  Mildly hypertensive on intake of medicines as well.  GU exam as above, abdominal exam is benign.  DDx includes not limited to perianal abscess, rectal abscess, hemorrhoid.  Risk Prescription drug management.   Abscess is already open and draining, small at 2-1/2 x 1 cm  on exam with very mild surrounding induration.  Will initiate antibiotics in the outpatient setting, no indication for imaging at this time.  Jemaine voiced understanding of his medical evaluation and treatment plan. Each of their questions answered to their expressed satisfaction.  Return precautions were given.  Patient is well-appearing, stable, and was discharged in good condition.   This chart was dictated using voice recognition software, Dragon. Despite the best efforts of this provider to proofread and correct errors, errors may still occur which can change documentation meaning.      Final diagnoses:  Perianal abscess    ED Discharge Orders          Ordered    amoxicillin -clavulanate (AUGMENTIN ) 875-125 MG tablet  Every 12 hours        05/13/24 0503               Quintan Saldivar, Pleasant SAUNDERS, PA-C 05/13/24 0659    Bhavika Schnider, Pleasant SAUNDERS, PA-C 05/13/24 0700    Carita Senior, MD 05/13/24 [phone]

## 2024-05-21 DIAGNOSIS — F319 Bipolar disorder, unspecified: Secondary | ICD-10-CM | POA: Diagnosis not present

## 2024-05-21 DIAGNOSIS — F902 Attention-deficit hyperactivity disorder, combined type: Secondary | ICD-10-CM | POA: Diagnosis not present

## 2024-05-21 DIAGNOSIS — F4310 Post-traumatic stress disorder, unspecified: Secondary | ICD-10-CM | POA: Diagnosis not present

## 2024-05-24 ENCOUNTER — Other Ambulatory Visit: Payer: Self-pay

## 2024-05-24 ENCOUNTER — Telehealth: Admitting: Infectious Diseases

## 2024-05-29 ENCOUNTER — Telehealth: Payer: Self-pay

## 2024-05-29 NOTE — Telephone Encounter (Signed)
 Left patient a voice mail to call back to schedule missed appointment with Gerald Lee, ok for virtual for ADA paper work with either Gerald Lee or Gerald Lee.

## 2024-06-07 ENCOUNTER — Other Ambulatory Visit: Payer: Self-pay

## 2024-06-07 ENCOUNTER — Other Ambulatory Visit (HOSPITAL_COMMUNITY): Payer: Self-pay

## 2024-06-07 NOTE — Progress Notes (Signed)
 Specialty Pharmacy Refill Coordination Note  Gerald Lee is a 28 y.o. male assessed today regarding refills of clinic administered specialty medication(s) Cabotegravir  & Rilpivirine  (Cabenuva )   Clinic requested Courier to Provider Office   Delivery date: 06/13/24   Verified address: [address] [employer] [address] [place] [address]   Medication will be filled on 06/12/24.

## 2024-06-12 ENCOUNTER — Other Ambulatory Visit: Payer: Self-pay

## 2024-06-13 ENCOUNTER — Telehealth: Admitting: Infectious Diseases

## 2024-06-13 ENCOUNTER — Other Ambulatory Visit: Payer: Self-pay

## 2024-06-13 ENCOUNTER — Telehealth: Payer: Self-pay

## 2024-06-13 DIAGNOSIS — Z21 Asymptomatic human immunodeficiency virus [HIV] infection status: Secondary | ICD-10-CM

## 2024-06-13 DIAGNOSIS — F319 Bipolar disorder, unspecified: Secondary | ICD-10-CM | POA: Diagnosis not present

## 2024-06-13 DIAGNOSIS — R109 Unspecified abdominal pain: Secondary | ICD-10-CM

## 2024-06-13 DIAGNOSIS — M545 Low back pain, unspecified: Secondary | ICD-10-CM | POA: Diagnosis not present

## 2024-06-13 DIAGNOSIS — F4310 Post-traumatic stress disorder, unspecified: Secondary | ICD-10-CM | POA: Diagnosis not present

## 2024-06-13 DIAGNOSIS — Z0289 Encounter for other administrative examinations: Secondary | ICD-10-CM | POA: Diagnosis not present

## 2024-06-13 DIAGNOSIS — K580 Irritable bowel syndrome with diarrhea: Secondary | ICD-10-CM | POA: Diagnosis not present

## 2024-06-13 DIAGNOSIS — F902 Attention-deficit hyperactivity disorder, combined type: Secondary | ICD-10-CM | POA: Diagnosis not present

## 2024-06-13 NOTE — Telephone Encounter (Signed)
 [facility] Patient Advocate Encounter  Patient's medications CABENUVA  have been couriered to [facility] from [facility] and will be administered at the patients appointment on 06/25/24.  Charmaine Sharps, CPhT Specialty Pharmacy Patient [facility] for [facility] Disease Phone: [phone] Fax:  [phone]

## 2024-06-13 NOTE — Progress Notes (Signed)
 Virtual Visit via Video Note  I connected with Gerald Lee on 06/13/24 at 11:15 AM EDT by a video enabled telemedicine application and verified that I am speaking with the correct person using two identifiers.  Location: Patient: [place] residence Provider: [facility] clinic   I discussed the limitations of evaluation and management by telemedicine and the availability of in person appointments. The patient expressed understanding and agreed to proceed.  History of Present Illness: Needs new ADA forms completed with new job. 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   Needs two separate accommodations requested  - one for the injections every 2 months 2 days off total for medical appt/transportation and expected side effects. - second for back pain and IBS symptoms. Back pain expected flare ups twice a month lasting 3 days each episode as he cannot stand or sit long periods of time, sometimes cannot stand erect due to discomfort and pain. He is working on getting in to orthospine for evaluation, but had Medicaid barriers recently that he has resolved. Standing desk and preferably floor mat to help with discomfort.  He will need more liberal bathroom breaks to allow for comfort and ability to manage IBS symptoms and unpredictable bowel habits.     Observations/Objective: Well appearing on video camera  Assessment and Plan: Problem List Items Addressed This Visit       Unprioritized   Asymptomatic HIV infection (HCC)   Other Visit Diagnoses       Encounter for completion of form with patient    -  Primary     Irritable bowel syndrome with diarrhea         Abdominal pain, unspecified abdominal location         Chronic low back pain, unspecified back pain laterality, unspecified whether sciatica present          Forms completed with patient and faxed personally to company. Retained for records. Cabenuva  appt moved to 10/28 per patient request     Corean Fireman, MSN, NP-C [facility]  [email or web address] Pager: [phone] Office: [phone] [facility] Main Line: [phone] *Secure Chat Communication Welcome  Total Encounter Time: 20 min

## 2024-06-17 ENCOUNTER — Ambulatory Visit: Admitting: Infectious Diseases

## 2024-06-21 ENCOUNTER — Ambulatory Visit: Admitting: Infectious Diseases

## 2024-06-21 DIAGNOSIS — F902 Attention-deficit hyperactivity disorder, combined type: Secondary | ICD-10-CM | POA: Diagnosis not present

## 2024-06-21 DIAGNOSIS — F319 Bipolar disorder, unspecified: Secondary | ICD-10-CM | POA: Diagnosis not present

## 2024-06-21 DIAGNOSIS — F4310 Post-traumatic stress disorder, unspecified: Secondary | ICD-10-CM | POA: Diagnosis not present

## 2024-06-24 NOTE — Progress Notes (Unsigned)
 HPI: Gerald Lee is a 28 y.o. male who presents to the [facility] pharmacy clinic for Cabenuva  administration.  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   CABOTEGRAVIR  & RILPIVIRINE  ER (CABENUVA ) 600 & 900 MG/3ML INJECTION    Inject 1 kit into the muscle every 2 (two) months.   DOXYCYCLINE  (VIBRA -TABS) 100 MG TABLET    Take 2 tablets (200 mg total) by mouth once as needed for up to 1 dose. Within 24 hours of at risk activity   LORATADINE  (CLARITIN ) 10 MG TABLET    Take 1 tablet (10 mg total) by mouth daily.  Modified Medications   No medications on file  Discontinued Medications   No medications on file    Allergies: Allergies  Allergen Reactions   Ibuprofen  Other (See Comments)    Per patient can not take because it will shut down my kidneys    Labs: Lab Results  Component Value Date   HIV1RNAQUANT NOT DETECTED 04/16/2024   HIV1RNAQUANT NOT DETECTED 12/25/2023   HIV1RNAQUANT Not Detected 06/20/2023   CD4TABS 1,042 06/20/2023   CD4TABS 959 06/16/2022   CD4TABS 916 08/19/2021    RPR and STI Lab Results  Component Value Date   LABRPR REACTIVE (A) 12/25/2023    LABRPR REACTIVE (A) 06/20/2023   LABRPR REACTIVE (A) 05/24/2023   LABRPR REACTIVE (A) 06/16/2022   LABRPR REACTIVE (A) 03/30/2022   RPRTITER 1:2 (H) 12/25/2023   RPRTITER 1:16 (H) 06/20/2023   RPRTITER 1:32 (H) 05/24/2023   RPRTITER 1:16 (H) 06/16/2022   RPRTITER 1:32 (H) 03/30/2022    STI Results GC CT  03/30/2022  1:57 PM Negative    Negative    Negative  Negative    Negative    Negative   04/27/2021  3:11 PM Positive    Positive    Negative  Negative    Negative    Negative   12/10/2020  3:36 PM Negative    Negative    Negative  Negative    Positive    Positive   10/16/2019 11:05 AM Negative  Negative   05/11/2019 12:00 AM **POSITIVE**  C **POSITIVE**  C  03/11/2019 12:00 AM Negative  Negative   01/06/2019 12:00 AM Negative  Negative   10/01/2018 12:00 AM **POSITIVE**  Negative   06/11/2018 12:00 AM Negative    Negative    Negative  Negative    Negative    Negative   02/16/2018 12:00 AM Negative  Negative   10/13/2017 12:00 AM Negative  Negative   09/11/2017 12:00 AM Negative  Negative   05/22/2017 12:00 AM Negative    Negative  Negative    **POSITIVE**   05/17/2017 12:00 AM Negative  Negative  C Corrected result    Hepatitis B Lab Results  Component Value Date   HEPBSAB REACTIVE (A) 03/11/2019   HEPBSAG NON-REACTIVE 05/22/2017   Hepatitis C Lab Results  Component Value Date   HEPCAB NON-REACTIVE 05/22/2017   Hepatitis A Lab Results  Component Value Date   HAV REACTIVE (A) 05/22/2017   Lipids: Lab Results  Component Value Date   CHOL 131 06/20/2023   TRIG 65 06/20/2023   HDL 48 06/20/2023   CHOLHDL 2.7 06/20/2023   LDLCALC 69 06/20/2023    Target Date: The 22nd  Assessment: Gerald Lee presents today for his maintenance Cabenuva  injections. Past injections were tolerated well without issues. Last HIV RNA was not detected in August. Doing well with no issues today.  Lab work:  None today; declines need for sexual health  screening  Eligible vaccinations:  Annual flu and COVID vaccines; accepts these today.  Cabenuva : Administered cabotegravir  600mg /13mL in left upper outer quadrant of the gluteal muscle. Administered rilpivirine  900 mg/3mL in the right upper outer quadrant of the gluteal muscle. No issues with injections. He will follow up in 2 months for next set of injections.  Plan: - Cabenuva  injections administered - Administered 2025-2026 influenza vaccine  - Administered the 2025-2026 COVID vaccine today - Next injections scheduled for 08/15/24 with Corean and 10/15/24 with me - Call with any issues or questions  Matheu Ploeger L. Lateef Juncaj, PharmD, BCIDP, AAHIVP, CPP Clinical Pharmacist Practitioner - Infectious Diseases Clinical Pharmacist Lead - Specialty Pharmacy [facility] for Infectious Disease

## 2024-06-25 ENCOUNTER — Ambulatory Visit: Admitting: Pharmacist

## 2024-06-25 ENCOUNTER — Other Ambulatory Visit: Payer: Self-pay

## 2024-06-25 DIAGNOSIS — F4310 Post-traumatic stress disorder, unspecified: Secondary | ICD-10-CM | POA: Diagnosis not present

## 2024-06-25 DIAGNOSIS — F319 Bipolar disorder, unspecified: Secondary | ICD-10-CM | POA: Diagnosis not present

## 2024-06-25 DIAGNOSIS — B20 Human immunodeficiency virus [HIV] disease: Secondary | ICD-10-CM

## 2024-06-25 DIAGNOSIS — Z23 Encounter for immunization: Secondary | ICD-10-CM | POA: Diagnosis present

## 2024-06-25 DIAGNOSIS — F902 Attention-deficit hyperactivity disorder, combined type: Secondary | ICD-10-CM | POA: Diagnosis not present

## 2024-06-25 MED ORDER — CABOTEGRAVIR & RILPIVIRINE ER 600 & 900 MG/3ML IM SUER
1.0000 | Freq: Once | INTRAMUSCULAR | Status: AC
  Administered 2024-06-25: 1 via INTRAMUSCULAR

## 2024-06-25 NOTE — Patient Instructions (Signed)
 Gerald Lee

## 2024-06-28 DIAGNOSIS — F319 Bipolar disorder, unspecified: Secondary | ICD-10-CM | POA: Diagnosis not present

## 2024-06-28 DIAGNOSIS — F902 Attention-deficit hyperactivity disorder, combined type: Secondary | ICD-10-CM | POA: Diagnosis not present

## 2024-06-28 DIAGNOSIS — F4310 Post-traumatic stress disorder, unspecified: Secondary | ICD-10-CM | POA: Diagnosis not present

## 2024-07-12 DIAGNOSIS — F4310 Post-traumatic stress disorder, unspecified: Secondary | ICD-10-CM | POA: Diagnosis not present

## 2024-07-12 DIAGNOSIS — F902 Attention-deficit hyperactivity disorder, combined type: Secondary | ICD-10-CM | POA: Diagnosis not present

## 2024-07-12 DIAGNOSIS — F319 Bipolar disorder, unspecified: Secondary | ICD-10-CM | POA: Diagnosis not present

## 2024-07-26 ENCOUNTER — Other Ambulatory Visit (HOSPITAL_COMMUNITY): Payer: Self-pay

## 2024-07-26 ENCOUNTER — Other Ambulatory Visit: Payer: Self-pay

## 2024-07-26 DIAGNOSIS — F4310 Post-traumatic stress disorder, unspecified: Secondary | ICD-10-CM | POA: Diagnosis not present

## 2024-07-26 DIAGNOSIS — F319 Bipolar disorder, unspecified: Secondary | ICD-10-CM | POA: Diagnosis not present

## 2024-07-26 DIAGNOSIS — F902 Attention-deficit hyperactivity disorder, combined type: Secondary | ICD-10-CM | POA: Diagnosis not present

## 2024-07-26 NOTE — Progress Notes (Signed)
 Specialty Pharmacy Refill Coordination Note  Gerald Lee is a 28 y.o. male assessed today regarding refills of clinic administered specialty medication(s) Cabotegravir  & Rilpivirine  (Cabenuva )   Clinic requested Courier to Provider Office   Delivery date: 08/05/24   Verified address: [address] [employer] [address] [place] [address]   Medication will be filled on 08/02/24.

## 2024-08-02 ENCOUNTER — Other Ambulatory Visit: Payer: Self-pay

## 2024-08-05 ENCOUNTER — Telehealth: Payer: Self-pay

## 2024-08-05 NOTE — Telephone Encounter (Signed)
 [facility] Patient Advocate Encounter  Patient's medications Cabenuva  have been couriered to [facility] from [facility] and will be administered at the patients appointment on 08/15/24.  Arland Hutchinson, CPhT Specialty Pharmacy Patient [facility] for Infectious Disease Phone: [phone] Fax:  [phone]

## 2024-08-07 DIAGNOSIS — F319 Bipolar disorder, unspecified: Secondary | ICD-10-CM | POA: Diagnosis not present

## 2024-08-07 DIAGNOSIS — F902 Attention-deficit hyperactivity disorder, combined type: Secondary | ICD-10-CM | POA: Diagnosis not present

## 2024-08-07 DIAGNOSIS — F4310 Post-traumatic stress disorder, unspecified: Secondary | ICD-10-CM | POA: Diagnosis not present

## 2024-08-13 ENCOUNTER — Ambulatory Visit: Admitting: Pharmacist

## 2024-08-15 ENCOUNTER — Ambulatory Visit: Admitting: Infectious Diseases

## 2024-08-15 NOTE — Progress Notes (Unsigned)
 HPI: Gerald Lee is a 28 y.o. male who presents to the [facility] pharmacy clinic for Cabenuva  administration.  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   CABOTEGRAVIR  & RILPIVIRINE  ER (CABENUVA ) 600 & 900 MG/3ML INJECTION    Inject 1 kit into the muscle every 2 (two) months.   DOXYCYCLINE  (VIBRA -TABS) 100 MG TABLET    Take 2 tablets (200 mg total) by mouth once as needed for up to 1 dose. Within 24 hours of at risk activity   LORATADINE  (CLARITIN ) 10 MG TABLET    Take 1 tablet (10 mg total) by mouth daily.  Modified Medications   No medications on file  Discontinued Medications   No medications on file    Allergies: Allergies[1]  Labs: Lab Results  Component Value Date   HIV1RNAQUANT NOT DETECTED 04/16/2024   HIV1RNAQUANT NOT DETECTED 12/25/2023   HIV1RNAQUANT Not Detected 06/20/2023   CD4TABS 1,042 06/20/2023   CD4TABS 959 06/16/2022   CD4TABS 916 08/19/2021    RPR and STI Lab Results  Component Value Date   LABRPR REACTIVE (A) 12/25/2023   LABRPR REACTIVE (A) 06/20/2023   LABRPR REACTIVE (A) 05/24/2023   LABRPR REACTIVE (A) 06/16/2022   LABRPR REACTIVE (A) 03/30/2022    RPRTITER 1:2 (H) 12/25/2023   RPRTITER 1:16 (H) 06/20/2023   RPRTITER 1:32 (H) 05/24/2023   RPRTITER 1:16 (H) 06/16/2022   RPRTITER 1:32 (H) 03/30/2022    STI Results GC CT  03/30/2022  1:57 PM Negative    Negative    Negative  Negative    Negative    Negative   04/27/2021  3:11 PM Positive    Positive    Negative  Negative    Negative    Negative   12/10/2020  3:36 PM Negative    Negative    Negative  Negative    Positive    Positive   10/16/2019 11:05 AM Negative  Negative   05/11/2019 12:00 AM **POSITIVE**  C **POSITIVE**  C  03/11/2019 12:00 AM Negative  Negative   01/06/2019 12:00 AM Negative  Negative   10/01/2018 12:00 AM **POSITIVE**  Negative   06/11/2018 12:00 AM Negative    Negative    Negative  Negative    Negative    Negative   02/16/2018 12:00 AM Negative  Negative   10/13/2017 12:00 AM Negative  Negative   09/11/2017 12:00 AM Negative  Negative   05/22/2017 12:00 AM Negative    Negative  Negative    **POSITIVE**   05/17/2017 12:00 AM Negative  Negative     C Corrected result    Hepatitis B Lab Results  Component Value Date   HEPBSAB REACTIVE (A) 03/11/2019   HEPBSAG NON-REACTIVE 05/22/2017   Hepatitis  C Lab Results  Component Value Date   HEPCAB NON-REACTIVE 05/22/2017   Hepatitis A Lab Results  Component Value Date   HAV REACTIVE (A) 05/22/2017   Lipids: Lab Results  Component Value Date   CHOL 131 06/20/2023   TRIG 65 06/20/2023   HDL 48 06/20/2023   CHOLHDL 2.7 06/20/2023   LDLCALC 69 06/20/2023    Target Date: The 22nd  Assessment: Gerald Lee presents today for his maintenance Cabenuva  injections. Past injections were tolerated well without issues. Last HIV RNA was not detected in August. Doing well with no issues today.  Lab work:  None today  Eligible vaccinations:  Currently up to date on all recommended vaccines.   Cabenuva : Administered cabotegravir  600mg /26mL in left upper outer quadrant of the gluteal muscle.  Administered rilpivirine  900 mg/3mL in the right upper outer quadrant of the gluteal muscle. No issues with injections. He will follow up in 2 months for next set of injections.  Plan: - Cabenuva  injections administered - Next injections scheduled for *** with STEPHANIE CHANGE** - Call with any issues or questions  Shawnika Pepin L. Haeley Fordham, PharmD, BCIDP, AAHIVP, CPP Clinical Pharmacist Practitioner - Infectious Diseases Clinical Pharmacist Lead - Specialty Pharmacy [facility] for Infectious Disease     [1]  Allergies Allergen Reactions   Ibuprofen  Other (See Comments)    Per patient can not take because it will shut down my kidneys

## 2024-08-16 ENCOUNTER — Ambulatory Visit: Admission: EM | Admit: 2024-08-16 | Discharge: 2024-08-16 | Disposition: A | Discharge status: Home / Self Care

## 2024-08-16 ENCOUNTER — Encounter: Payer: Self-pay | Admitting: Emergency Medicine

## 2024-08-16 DIAGNOSIS — F319 Bipolar disorder, unspecified: Secondary | ICD-10-CM | POA: Diagnosis not present

## 2024-08-16 DIAGNOSIS — F902 Attention-deficit hyperactivity disorder, combined type: Secondary | ICD-10-CM | POA: Diagnosis not present

## 2024-08-16 DIAGNOSIS — J101 Influenza due to other identified influenza virus with other respiratory manifestations: Secondary | ICD-10-CM | POA: Diagnosis not present

## 2024-08-16 DIAGNOSIS — F4310 Post-traumatic stress disorder, unspecified: Secondary | ICD-10-CM | POA: Diagnosis not present

## 2024-08-16 LAB — POC COVID19/FLU A&B COMBO
Covid Antigen, POC: NEGATIVE
Influenza A Antigen, POC: POSITIVE — AB
Influenza B Antigen, POC: NEGATIVE

## 2024-08-16 MED ORDER — OSELTAMIVIR PHOSPHATE 75 MG PO CAPS: 75.0000 mg | ORAL_CAPSULE | Freq: Two times a day (BID) | ORAL | 0 refills | Status: AC

## 2024-08-16 NOTE — ED Triage Notes (Signed)
 Pt presents c/o URI x 4 days. Pt states,  I have been having trouble breathing through my nose since Monday. I need to see if I have a cold or something. I do have HIV so when I develop a cold it can knock me down for a while. I also have a cough that has developed as well. I am coughing up mucus as well that is yellowish and sometimes clear. My mom have pneumonia so that is why I am worried.  Pt denies emesis and diarrhea.

## 2024-08-16 NOTE — Discharge Instructions (Signed)
 You were seen in urgent care today for concerns of congestion and breathing problems.  You tested positive for influenza A.  This is likely her current cause of symptoms.  I started you on Tamiflu  to help with this virus to help reduce your duration of illness.  For any concerns of worsening symptoms, return to urgent care or go to your nearest emergency department.  You are contagious so try to reduce contact or exposure to individuals who are considered high risk such as the elderly, the [place], and the immunocompromise.

## 2024-08-16 NOTE — ED Provider Notes (Signed)
 " [facility]    CSN: [id number] Arrival date & time: 08/16/24  1404      History   Chief Complaint No chief complaint on file.   HPI Gerald Lee is a 28 y.o. male.  Patient with past history significant for HIV presents to the urgent care today with concerns of nasal congestion and coughing.  Reports symptoms ongoing for about 4 days.  Reports contact with his mother who was recently diagnosed with pneumonia but denies other sick contacts.  No reported nausea, vomiting, or diarrhea.  He does report coughing up a yellow mucus but at times this will clear up as well.  He is currently on Cabenuva  for HIV.  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 Authorizing Provider  oseltamivir  (TAMIFLU ) 75 MG capsule Take 1 capsule (75 mg total) by mouth every 12 (twelve) hours. 08/16/24  Yes Yarethzy Croak A, PA-C  OVER THE COUNTER MEDICATION Vick's Sinex Severe nasal spray   Yes [provider]  albuterol   (VENTOLIN  HFA) 108 (90 Base) MCG/ACT inhaler Inhale 2 puffs into the lungs every 6 (six) hours as needed for wheezing or shortness of breath. 04/16/24   Kuppelweiser, Cassie L, RPH-CPP  cabotegravir  & rilpivirine  ER (CABENUVA ) 600 & 900 MG/3ML injection Inject 1 kit into the muscle every 2 (two) months. 12/22/23   Waddell Alan PARAS, RPH-CPP  doxycycline  (VIBRA -TABS) 100 MG tablet Take 2 tablets (200 mg total) by mouth once as needed for up to 1 dose. Within 24 hours of at risk activity 12/25/23   Melvenia Corean SAILOR, NP  loratadine  (CLARITIN ) 10 MG tablet Take 1 tablet (10 mg total) by mouth daily. 12/25/23   Melvenia Corean SAILOR, NP    Family History Family History  Problem Relation Age of Onset   Stroke Mother    Hypertension Mother    Diabetes Father     Social History Social History[1]   Allergies   Ibuprofen    Review of Systems Review of Systems  HENT:  Positive for congestion.   All other systems reviewed and are negative.    Physical Exam Triage Vital Signs ED Triage Vitals  Encounter Vitals Group     BP 08/16/24 1534 123/82     Girls Systolic BP Percentile --      Girls Diastolic BP Percentile --      Boys Systolic BP Percentile --      Boys Diastolic BP Percentile --  Pulse Rate 08/16/24 1534 66     Resp 08/16/24 1534 18     Temp 08/16/24 1534 98.7 F (37.1 C)     Temp Source 08/16/24 1534 Oral     SpO2 08/16/24 1534 96 %     Weight 08/16/24 1533 (!) 317 lb 14.5 oz (144.2 kg)     Height --      Head Circumference --      Peak Flow --      Pain Score 08/16/24 1533 0     Pain Loc --      Pain Education --      Exclude from Growth Chart --    No data found.  Updated Vital Signs BP 123/82 (BP Location: Left Arm)   Pulse 66   Temp 98.7 F (37.1 C) (Oral)   Resp 18   Wt (!) 317 lb 14.5 oz (144.2 kg)   SpO2 96%   BMI 41.94 kg/m   Visual Acuity Right Eye Distance:   Left Eye Distance:   Bilateral Distance:    Right Eye Near:   Left Eye Near:     Bilateral Near:     Physical Exam Vitals and nursing note reviewed.  Constitutional:      General: He is not in acute distress.    Appearance: He is well-developed.  HENT:     Head: Normocephalic and atraumatic.  Eyes:     Conjunctiva/sclera: Conjunctivae normal.  Cardiovascular:     Rate and Rhythm: Normal rate and regular rhythm.     Heart sounds: No murmur heard. Pulmonary:     Effort: Pulmonary effort is normal. No respiratory distress.     Breath sounds: Normal breath sounds. No wheezing or rales.  Abdominal:     Palpations: Abdomen is soft.     Tenderness: There is no abdominal tenderness.  Musculoskeletal:        General: No swelling.     Cervical back: Neck supple.  Skin:    General: Skin is warm and dry.     Capillary Refill: Capillary refill takes less than 2 seconds.  Neurological:     Mental Status: He is alert.  Psychiatric:        Mood and Affect: Mood normal.      UC Treatments / Results  Labs (all labs ordered are listed, but only abnormal results are displayed) Labs Reviewed  POC COVID19/FLU A&B COMBO - Abnormal; Notable for the following components:      Result Value   Influenza A Antigen, POC Positive (*)    All other components within normal limits    EKG   Radiology No results found.  Procedures Procedures (including critical care time)  Medications Ordered in UC Medications - No data to display  Initial Impression / Assessment and Plan / UC Course  I have reviewed the triage vital signs and the nursing notes.  Pertinent labs & imaging results that were available during my care of the patient were reviewed by me and considered in my medical decision making (see chart for details).     This patient presents to the UC for concern of congestion.  Differential diagnosis includes COVID-19, influenza, bronchitis, pneumonia    Additional history obtained:  Additional history obtained from chart review   Lab Tests:  I Ordered,  and personally interpreted labs.  The pertinent results include: Respiratory panel positive for influenza A   Problem List /UC course:  Patient presents to urgent care today with concerns of  congestion and breathing difficulty.  Reports symptoms ongoing for the last 4 days and primarily concerned about congestion began difficult to breathe through his nose.  Has been using over-the-counter medications without significant proved in symptoms.  Denies any nausea, vomiting, diarrhea.  Endorses some subjective fever and chills.  Only endorses contact with his mother who was sick recently diagnosed with pneumonia.  Denies any other sick contacts. Physical exam is reassuring without no abnormal heart or lung sounds.  Uvula is midline tonsils are not erythematous with no exudate seen. Point-of-care COVID and flu testing obtained at urgent care which is positive for influenza A.  Suspect likely cause of symptoms.  Given patient's HIV status, advised starting treatment. Patient agreeable with this plan.  Tamiflu  sent to pharmacy.  Return precautions discussed.  Discharged home stable condition.   Social Determinants of Health:  None  Final Clinical Impressions(s) / UC Diagnoses   Final diagnoses:  Influenza A     Discharge Instructions      You were seen in urgent care today for concerns of congestion and breathing problems.  You tested positive for influenza A.  This is likely her current cause of symptoms.  I started you on Tamiflu  to help with this virus to help reduce your duration of illness.  For any concerns of worsening symptoms, return to urgent care or go to your nearest emergency department.  You are contagious so try to reduce contact or exposure to individuals who are considered high risk such as the elderly, the [place], and the immunocompromise.     ED Prescriptions     Medication Sig Dispense Auth. Provider   oseltamivir  (TAMIFLU ) 75 MG capsule Take 1 capsule (75 mg total) by  mouth every 12 (twelve) hours. 10 capsule Romilda Proby A, PA-C      PDMP not reviewed this encounter.    [1]  Social History Tobacco Use   Smoking status: Former    Current packs/day: 0.00    Average packs/day: 0.1 packs/day    Types: Cigarettes, Cigars    Quit date: 11/29/2019    Years since quitting: 4.7    Passive exposure: Current   Smokeless tobacco: Never   Tobacco comments:    only when super stressed  Vaping Use   Vaping status: Some Days  Substance Use Topics   Alcohol use: Yes    Alcohol/week: 3.0 standard drinks of alcohol    Types: 3 Standard drinks or equivalent per week   Drug use: Yes    Frequency: 7.0 times per week    Types: Marijuana    Comment: daily: weekly patient goes through 7 grams     Harman Ferrin A, PA-C 08/16/24 1645  "

## 2024-08-19 ENCOUNTER — Ambulatory Visit: Admitting: Pharmacist

## 2024-08-19 ENCOUNTER — Other Ambulatory Visit: Payer: Self-pay

## 2024-08-19 DIAGNOSIS — B20 Human immunodeficiency virus [HIV] disease: Secondary | ICD-10-CM

## 2024-08-19 MED ORDER — CABOTEGRAVIR & RILPIVIRINE ER 600 & 900 MG/3ML IM SUER
1.0000 | Freq: Once | INTRAMUSCULAR | Status: AC
  Administered 2024-08-19: 1 via INTRAMUSCULAR

## 2024-10-04 ENCOUNTER — Other Ambulatory Visit: Payer: Self-pay

## 2024-10-04 ENCOUNTER — Other Ambulatory Visit (HOSPITAL_COMMUNITY): Payer: Self-pay

## 2024-10-04 NOTE — Progress Notes (Signed)
 Specialty Pharmacy Refill Coordination Note  Gerald Lee is a 29 y.o. male assessed today regarding refills of clinic administered specialty medication(s) Cabotegravir  & Rilpivirine  (Cabenuva )   Clinic requested Courier to Provider Office   Delivery date: 10/14/24   Verified address: [address] [employer] [address] [place] [address]   Medication will be filled on 10/11/24.

## 2024-10-15 ENCOUNTER — Ambulatory Visit: Admitting: Pharmacist

## 2024-10-16 ENCOUNTER — Ambulatory Visit: Payer: Self-pay | Admitting: Pharmacist

## 2024-12-24 ENCOUNTER — Ambulatory Visit: Payer: Self-pay | Admitting: Infectious Diseases
